# Patient Record
Sex: Female | Born: 1935 | Race: White | Hispanic: No | Marital: Married | Smoking: Never smoker
Health system: Southern US, Community
[De-identification: ages and names within clinical notes are randomized; demographics above are authoritative.]

## PROBLEM LIST (undated history)

## (undated) DIAGNOSIS — G4700 Insomnia, unspecified: Secondary | ICD-10-CM

## (undated) DIAGNOSIS — Z923 Personal history of irradiation: Secondary | ICD-10-CM

## (undated) DIAGNOSIS — Z9889 Other specified postprocedural states: Secondary | ICD-10-CM

## (undated) DIAGNOSIS — Z8489 Family history of other specified conditions: Secondary | ICD-10-CM

## (undated) DIAGNOSIS — R112 Nausea with vomiting, unspecified: Secondary | ICD-10-CM

## (undated) DIAGNOSIS — G43909 Migraine, unspecified, not intractable, without status migrainosus: Secondary | ICD-10-CM

## (undated) DIAGNOSIS — M5020 Other cervical disc displacement, unspecified cervical region: Secondary | ICD-10-CM

## (undated) DIAGNOSIS — C50919 Malignant neoplasm of unspecified site of unspecified female breast: Secondary | ICD-10-CM

## (undated) DIAGNOSIS — I10 Essential (primary) hypertension: Secondary | ICD-10-CM

## (undated) DIAGNOSIS — Z800 Family history of malignant neoplasm of digestive organs: Secondary | ICD-10-CM

## (undated) DIAGNOSIS — H409 Unspecified glaucoma: Secondary | ICD-10-CM

## (undated) DIAGNOSIS — K219 Gastro-esophageal reflux disease without esophagitis: Secondary | ICD-10-CM

## (undated) DIAGNOSIS — C50911 Malignant neoplasm of unspecified site of right female breast: Secondary | ICD-10-CM

## (undated) DIAGNOSIS — Z87442 Personal history of urinary calculi: Secondary | ICD-10-CM

## (undated) DIAGNOSIS — E785 Hyperlipidemia, unspecified: Secondary | ICD-10-CM

## (undated) DIAGNOSIS — M75101 Unspecified rotator cuff tear or rupture of right shoulder, not specified as traumatic: Secondary | ICD-10-CM

## (undated) DIAGNOSIS — M1990 Unspecified osteoarthritis, unspecified site: Secondary | ICD-10-CM

## (undated) DIAGNOSIS — C4490 Unspecified malignant neoplasm of skin, unspecified: Secondary | ICD-10-CM

## (undated) DIAGNOSIS — Z9221 Personal history of antineoplastic chemotherapy: Secondary | ICD-10-CM

## (undated) DIAGNOSIS — M8580 Other specified disorders of bone density and structure, unspecified site: Secondary | ICD-10-CM

## (undated) DIAGNOSIS — C50912 Malignant neoplasm of unspecified site of left female breast: Secondary | ICD-10-CM

## (undated) DIAGNOSIS — Z803 Family history of malignant neoplasm of breast: Secondary | ICD-10-CM

## (undated) HISTORY — PX: REPLACEMENT TOTAL KNEE BILATERAL: SUR1225

## (undated) HISTORY — PX: CHOLECYSTECTOMY OPEN: SUR202

## (undated) HISTORY — DX: Family history of malignant neoplasm of digestive organs: Z80.0

## (undated) HISTORY — DX: Other specified disorders of bone density and structure, unspecified site: M85.80

## (undated) HISTORY — DX: Insomnia, unspecified: G47.00

## (undated) HISTORY — DX: Gastro-esophageal reflux disease without esophagitis: K21.9

## (undated) HISTORY — PX: COLONOSCOPY W/ BIOPSIES AND POLYPECTOMY: SHX1376

## (undated) HISTORY — PX: PORTACATH PLACEMENT: SHX2246

## (undated) HISTORY — DX: Other cervical disc displacement, unspecified cervical region: M50.20

## (undated) HISTORY — DX: Family history of malignant neoplasm of breast: Z80.3

## (undated) HISTORY — DX: Malignant neoplasm of unspecified site of unspecified female breast: C50.919

## (undated) HISTORY — PX: TONSILLECTOMY: SUR1361

## (undated) HISTORY — PX: SKIN CANCER EXCISION: SHX779

## (undated) HISTORY — DX: Essential (primary) hypertension: I10

## (undated) HISTORY — PX: DILATION AND CURETTAGE OF UTERUS: SHX78

## (undated) HISTORY — PX: JOINT REPLACEMENT: SHX530

## (undated) HISTORY — PX: MASTECTOMY PARTIAL / LUMPECTOMY: SUR851

## (undated) HISTORY — PX: BREAST BIOPSY: SHX20

## (undated) HISTORY — DX: Hyperlipidemia, unspecified: E78.5

## (undated) HISTORY — PX: GLAUCOMA SURGERY: SHX656

## (undated) HISTORY — PX: WISDOM TOOTH EXTRACTION: SHX21

## (undated) HISTORY — PX: CATARACT EXTRACTION, BILATERAL: SHX1313

---

## 1980-07-01 HISTORY — PX: KIDNEY STONE SURGERY: SHX686

## 1997-07-01 LAB — HM SIGMOIDOSCOPY

## 1998-06-02 ENCOUNTER — Ambulatory Visit (HOSPITAL_COMMUNITY): Admission: RE | Admit: 1998-06-02 | Discharge: 1998-06-02 | Payer: Self-pay | Admitting: Family Medicine

## 1998-06-02 ENCOUNTER — Encounter: Payer: Self-pay | Admitting: Family Medicine

## 1998-11-03 ENCOUNTER — Encounter: Payer: Self-pay | Admitting: Internal Medicine

## 1998-11-03 ENCOUNTER — Ambulatory Visit (HOSPITAL_COMMUNITY): Admission: RE | Admit: 1998-11-03 | Discharge: 1998-11-03 | Payer: Self-pay | Admitting: Internal Medicine

## 1999-02-25 ENCOUNTER — Emergency Department (HOSPITAL_COMMUNITY): Admission: EM | Admit: 1999-02-25 | Discharge: 1999-02-25 | Payer: Self-pay

## 1999-03-14 ENCOUNTER — Encounter (INDEPENDENT_AMBULATORY_CARE_PROVIDER_SITE_OTHER): Payer: Self-pay | Admitting: Specialist

## 1999-03-14 ENCOUNTER — Encounter: Payer: Self-pay | Admitting: Surgery

## 1999-03-15 ENCOUNTER — Inpatient Hospital Stay (HOSPITAL_COMMUNITY): Admission: EM | Admit: 1999-03-15 | Discharge: 1999-03-17 | Payer: Self-pay | Admitting: Surgery

## 1999-04-24 ENCOUNTER — Encounter: Payer: Self-pay | Admitting: Family Medicine

## 1999-04-24 ENCOUNTER — Ambulatory Visit (HOSPITAL_COMMUNITY): Admission: RE | Admit: 1999-04-24 | Discharge: 1999-04-24 | Payer: Self-pay | Admitting: Family Medicine

## 2000-08-07 ENCOUNTER — Other Ambulatory Visit: Admission: RE | Admit: 2000-08-07 | Discharge: 2000-08-07 | Payer: Self-pay | Admitting: Family Medicine

## 2000-08-15 ENCOUNTER — Ambulatory Visit (HOSPITAL_COMMUNITY): Admission: RE | Admit: 2000-08-15 | Discharge: 2000-08-15 | Payer: Self-pay | Admitting: Family Medicine

## 2000-08-15 ENCOUNTER — Encounter: Payer: Self-pay | Admitting: Family Medicine

## 2000-10-21 ENCOUNTER — Encounter (INDEPENDENT_AMBULATORY_CARE_PROVIDER_SITE_OTHER): Payer: Self-pay | Admitting: *Deleted

## 2000-10-21 ENCOUNTER — Ambulatory Visit (HOSPITAL_BASED_OUTPATIENT_CLINIC_OR_DEPARTMENT_OTHER): Admission: RE | Admit: 2000-10-21 | Discharge: 2000-10-21 | Payer: Self-pay | Admitting: Plastic Surgery

## 2001-03-12 ENCOUNTER — Encounter: Admission: RE | Admit: 2001-03-12 | Discharge: 2001-03-12 | Payer: Self-pay | Admitting: Family Medicine

## 2001-03-12 ENCOUNTER — Encounter: Payer: Self-pay | Admitting: Family Medicine

## 2003-02-10 ENCOUNTER — Other Ambulatory Visit: Admission: RE | Admit: 2003-02-10 | Discharge: 2003-02-10 | Payer: Self-pay | Admitting: Family Medicine

## 2003-08-12 ENCOUNTER — Encounter: Admission: RE | Admit: 2003-08-12 | Discharge: 2003-08-12 | Payer: Self-pay | Admitting: Family Medicine

## 2004-02-18 ENCOUNTER — Ambulatory Visit (HOSPITAL_COMMUNITY): Admission: RE | Admit: 2004-02-18 | Discharge: 2004-02-18 | Payer: Self-pay | Admitting: Orthopedic Surgery

## 2004-03-30 ENCOUNTER — Ambulatory Visit (HOSPITAL_COMMUNITY): Admission: RE | Admit: 2004-03-30 | Discharge: 2004-03-30 | Payer: Self-pay | Admitting: Family Medicine

## 2004-04-25 ENCOUNTER — Ambulatory Visit: Payer: Self-pay | Admitting: Family Medicine

## 2004-04-25 ENCOUNTER — Other Ambulatory Visit: Admission: RE | Admit: 2004-04-25 | Discharge: 2004-04-25 | Payer: Self-pay | Admitting: Family Medicine

## 2004-05-10 ENCOUNTER — Ambulatory Visit: Payer: Self-pay | Admitting: Family Medicine

## 2004-07-01 LAB — HM MAMMOGRAPHY

## 2004-08-22 ENCOUNTER — Ambulatory Visit: Payer: Self-pay | Admitting: Family Medicine

## 2004-08-22 ENCOUNTER — Encounter: Admission: RE | Admit: 2004-08-22 | Discharge: 2004-08-22 | Payer: Self-pay | Admitting: Family Medicine

## 2004-08-28 ENCOUNTER — Ambulatory Visit: Payer: Self-pay | Admitting: Family Medicine

## 2004-08-30 ENCOUNTER — Encounter: Admission: RE | Admit: 2004-08-30 | Discharge: 2004-08-30 | Payer: Self-pay | Admitting: Family Medicine

## 2004-09-17 ENCOUNTER — Inpatient Hospital Stay (HOSPITAL_COMMUNITY): Admission: RE | Admit: 2004-09-17 | Discharge: 2004-09-20 | Payer: Self-pay | Admitting: Orthopedic Surgery

## 2004-10-26 ENCOUNTER — Ambulatory Visit: Payer: Self-pay | Admitting: Family Medicine

## 2005-03-27 ENCOUNTER — Ambulatory Visit (HOSPITAL_COMMUNITY): Admission: RE | Admit: 2005-03-27 | Discharge: 2005-03-27 | Payer: Self-pay | Admitting: Family Medicine

## 2005-04-23 ENCOUNTER — Ambulatory Visit: Payer: Self-pay | Admitting: Family Medicine

## 2005-04-26 ENCOUNTER — Ambulatory Visit: Payer: Self-pay | Admitting: Gastroenterology

## 2005-05-14 ENCOUNTER — Ambulatory Visit: Payer: Self-pay | Admitting: Gastroenterology

## 2005-05-14 ENCOUNTER — Encounter (INDEPENDENT_AMBULATORY_CARE_PROVIDER_SITE_OTHER): Payer: Self-pay | Admitting: *Deleted

## 2005-07-01 HISTORY — PX: MASTECTOMY PARTIAL / LUMPECTOMY: SUR851

## 2005-08-07 ENCOUNTER — Ambulatory Visit: Payer: Self-pay | Admitting: Family Medicine

## 2005-11-06 ENCOUNTER — Ambulatory Visit: Payer: Self-pay | Admitting: Family Medicine

## 2006-03-31 ENCOUNTER — Ambulatory Visit (HOSPITAL_COMMUNITY): Admission: RE | Admit: 2006-03-31 | Discharge: 2006-03-31 | Payer: Self-pay | Admitting: Family Medicine

## 2006-04-03 ENCOUNTER — Encounter: Admission: RE | Admit: 2006-04-03 | Discharge: 2006-04-03 | Payer: Self-pay | Admitting: Family Medicine

## 2006-04-07 ENCOUNTER — Encounter (INDEPENDENT_AMBULATORY_CARE_PROVIDER_SITE_OTHER): Payer: Self-pay | Admitting: *Deleted

## 2006-04-07 ENCOUNTER — Encounter: Admission: RE | Admit: 2006-04-07 | Discharge: 2006-04-07 | Payer: Self-pay | Admitting: Family Medicine

## 2006-04-07 ENCOUNTER — Encounter (INDEPENDENT_AMBULATORY_CARE_PROVIDER_SITE_OTHER): Payer: Self-pay | Admitting: Diagnostic Radiology

## 2006-04-07 DIAGNOSIS — Z853 Personal history of malignant neoplasm of breast: Secondary | ICD-10-CM | POA: Insufficient documentation

## 2006-04-10 ENCOUNTER — Ambulatory Visit: Payer: Self-pay | Admitting: Family Medicine

## 2006-04-10 ENCOUNTER — Encounter: Payer: Self-pay | Admitting: Family Medicine

## 2006-04-10 ENCOUNTER — Other Ambulatory Visit: Admission: RE | Admit: 2006-04-10 | Discharge: 2006-04-10 | Payer: Self-pay | Admitting: Family Medicine

## 2006-04-10 LAB — CONVERTED CEMR LAB
ALT: 21 units/L (ref 0–40)
AST: 18 units/L (ref 0–37)
Alkaline Phosphatase: 77 units/L (ref 39–117)
BUN: 14 mg/dL (ref 6–23)
Basophils Absolute: 0 10*3/uL (ref 0.0–0.1)
Basophils Relative: 0.3 % (ref 0.0–1.0)
Calcium: 9.2 mg/dL (ref 8.4–10.5)
Chol/HDL Ratio, serum: 6.9
Cholesterol: 264 mg/dL (ref 0–200)
Creatinine, Ser: 1.1 mg/dL (ref 0.4–1.2)
Eosinophil percent: 1.5 % (ref 0.0–5.0)
Glucose, Bld: 92 mg/dL (ref 70–99)
HCT: 39 % (ref 36.0–46.0)
HDL: 38.2 mg/dL — ABNORMAL LOW (ref >39.0)
Hemoglobin: 13.3 g/dL (ref 12.0–15.0)
LDL DIRECT: 180.7 mg/dL
Lymphocytes Relative: 28 % (ref 12.0–46.0)
MCHC: 34.2 g/dL (ref 30.0–36.0)
MCV: 91.6 fL (ref 78.0–100.0)
Monocytes Absolute: 0.6 10*3/uL (ref 0.2–0.7)
Monocytes Relative: 8 % (ref 3.0–11.0)
Neutro Abs: 5.1 10*3/uL (ref 1.4–7.7)
Neutrophils Relative %: 62.2 % (ref 43.0–77.0)
Platelets: 253 10*3/uL (ref 150–400)
Potassium: 3.4 meq/L — ABNORMAL LOW (ref 3.5–5.1)
RBC: 4.26 M/uL (ref 3.87–5.11)
RDW: 12.5 % (ref 11.5–14.6)
TSH: 1.91 microintl units/mL (ref 0.35–5.50)
Triglyceride fasting, serum: 193 mg/dL — ABNORMAL HIGH (ref 0–149)
VLDL: 39 mg/dL (ref 0–40)
WBC: 8.1 10*3/uL (ref 4.5–10.5)

## 2006-04-10 LAB — FECAL OCCULT BLOOD, GUAIAC: Fecal Occult Blood: NEGATIVE

## 2006-04-14 ENCOUNTER — Encounter: Admission: RE | Admit: 2006-04-14 | Discharge: 2006-04-14 | Payer: Self-pay | Admitting: Family Medicine

## 2006-04-15 ENCOUNTER — Ambulatory Visit: Payer: Self-pay | Admitting: Family Medicine

## 2006-04-21 ENCOUNTER — Encounter: Admission: RE | Admit: 2006-04-21 | Discharge: 2006-04-21 | Payer: Self-pay | Admitting: Family Medicine

## 2006-05-01 HISTORY — PX: INCISION AND DRAINAGE BREAST ABSCESS: SUR672

## 2006-05-02 ENCOUNTER — Encounter: Admission: RE | Admit: 2006-05-02 | Discharge: 2006-05-02 | Payer: Self-pay | Admitting: Surgery

## 2006-05-02 ENCOUNTER — Encounter (INDEPENDENT_AMBULATORY_CARE_PROVIDER_SITE_OTHER): Payer: Self-pay | Admitting: *Deleted

## 2006-05-02 ENCOUNTER — Ambulatory Visit (HOSPITAL_BASED_OUTPATIENT_CLINIC_OR_DEPARTMENT_OTHER): Admission: RE | Admit: 2006-05-02 | Discharge: 2006-05-02 | Payer: Self-pay | Admitting: Surgery

## 2006-05-14 ENCOUNTER — Ambulatory Visit: Payer: Self-pay | Admitting: Oncology

## 2006-05-15 ENCOUNTER — Ambulatory Visit: Admission: RE | Admit: 2006-05-15 | Discharge: 2006-06-29 | Payer: Self-pay | Admitting: Radiation Oncology

## 2006-05-15 ENCOUNTER — Ambulatory Visit: Payer: Self-pay | Admitting: Family Medicine

## 2006-05-15 LAB — CONVERTED CEMR LAB
Chol/HDL Ratio, serum: 5.5
Cholesterol: 216 mg/dL (ref 0–200)
HDL: 39.1 mg/dL (ref >39.0)
LDL DIRECT: 142.4 mg/dL
Triglyceride fasting, serum: 171 mg/dL — ABNORMAL HIGH (ref 0–149)
VLDL: 34 mg/dL (ref 0–40)

## 2006-05-28 LAB — COMPREHENSIVE METABOLIC PANEL
ALT: 22 U/L (ref 0–35)
AST: 19 U/L (ref 0–37)
Albumin: 3.8 g/dL (ref 3.5–5.2)
Alkaline Phosphatase: 79 U/L (ref 39–117)
BUN: 17 mg/dL (ref 6–23)
CO2: 26 mEq/L (ref 19–32)
Calcium: 9.4 mg/dL (ref 8.4–10.5)
Chloride: 107 mEq/L (ref 96–112)
Creatinine, Ser: 1.16 mg/dL (ref 0.40–1.20)
Glucose, Bld: 123 mg/dL — ABNORMAL HIGH (ref 70–99)
Potassium: 4.1 mEq/L (ref 3.5–5.3)
Sodium: 142 mEq/L (ref 135–145)
Total Bilirubin: 0.3 mg/dL (ref 0.3–1.2)
Total Protein: 6.7 g/dL (ref 6.0–8.3)

## 2006-05-28 LAB — CBC WITH DIFFERENTIAL/PLATELET
BASO%: 0.4 % (ref 0.0–2.0)
Basophils Absolute: 0 10*3/uL (ref 0.0–0.1)
EOS%: 3.1 % (ref 0.0–7.0)
Eosinophils Absolute: 0.2 10*3/uL (ref 0.0–0.5)
HCT: 37.6 % (ref 34.8–46.6)
HGB: 12.8 g/dL (ref 11.6–15.9)
LYMPH%: 29 % (ref 14.0–48.0)
MCH: 30.7 pg (ref 26.0–34.0)
MCHC: 33.9 g/dL (ref 32.0–36.0)
MCV: 90.6 fL (ref 81.0–101.0)
MONO#: 0.4 10*3/uL (ref 0.1–0.9)
MONO%: 6.2 % (ref 0.0–13.0)
NEUT#: 4.3 10*3/uL (ref 1.5–6.5)
NEUT%: 61.3 % (ref 39.6–76.8)
Platelets: 273 10*3/uL (ref 145–400)
RBC: 4.15 10*6/uL (ref 3.70–5.32)
RDW: 12.6 % (ref 11.3–14.5)
WBC: 7 10*3/uL (ref 3.9–10.0)
lymph#: 2 10*3/uL (ref 0.9–3.3)

## 2006-05-28 LAB — CANCER ANTIGEN 27.29: CA 27.29: 10 U/mL (ref 0–39)

## 2006-05-28 LAB — LACTATE DEHYDROGENASE: LDH: 168 U/L (ref 94–250)

## 2006-05-29 ENCOUNTER — Encounter (INDEPENDENT_AMBULATORY_CARE_PROVIDER_SITE_OTHER): Payer: Self-pay | Admitting: Specialist

## 2006-05-29 ENCOUNTER — Ambulatory Visit (HOSPITAL_COMMUNITY): Admission: RE | Admit: 2006-05-29 | Discharge: 2006-05-29 | Payer: Self-pay | Admitting: Surgery

## 2006-06-13 ENCOUNTER — Ambulatory Visit (HOSPITAL_COMMUNITY): Admission: RE | Admit: 2006-06-13 | Discharge: 2006-06-13 | Payer: Self-pay | Admitting: Oncology

## 2006-07-18 ENCOUNTER — Ambulatory Visit: Payer: Self-pay | Admitting: Oncology

## 2006-07-23 ENCOUNTER — Ambulatory Visit (HOSPITAL_COMMUNITY): Admission: RE | Admit: 2006-07-23 | Discharge: 2006-07-23 | Payer: Self-pay | Admitting: Oncology

## 2006-07-23 LAB — CBC WITH DIFFERENTIAL/PLATELET
BASO%: 0.3 % (ref 0.0–2.0)
Basophils Absolute: 0 10*3/uL (ref 0.0–0.1)
EOS%: 1.2 % (ref 0.0–7.0)
Eosinophils Absolute: 0.1 10*3/uL (ref 0.0–0.5)
HCT: 38.5 % (ref 34.8–46.6)
HGB: 13.2 g/dL (ref 11.6–15.9)
LYMPH%: 26.3 % (ref 14.0–48.0)
MCH: 30.9 pg (ref 26.0–34.0)
MCHC: 34.3 g/dL (ref 32.0–36.0)
MCV: 89.9 fL (ref 81.0–101.0)
MONO#: 0.5 10*3/uL (ref 0.1–0.9)
MONO%: 6.5 % (ref 0.0–13.0)
NEUT#: 4.7 10*3/uL (ref 1.5–6.5)
NEUT%: 65.7 % (ref 39.6–76.8)
Platelets: 242 10*3/uL (ref 145–400)
RBC: 4.29 10*6/uL (ref 3.70–5.32)
RDW: 13 % (ref 11.3–14.5)
WBC: 7.2 10*3/uL (ref 3.9–10.0)
lymph#: 1.9 10*3/uL (ref 0.9–3.3)

## 2006-07-23 LAB — COMPREHENSIVE METABOLIC PANEL
ALT: 14 U/L (ref 0–35)
AST: 14 U/L (ref 0–37)
Albumin: 3.9 g/dL (ref 3.5–5.2)
Alkaline Phosphatase: 77 U/L (ref 39–117)
BUN: 23 mg/dL (ref 6–23)
CO2: 25 mEq/L (ref 19–32)
Calcium: 9 mg/dL (ref 8.4–10.5)
Chloride: 108 mEq/L (ref 96–112)
Creatinine, Ser: 1.17 mg/dL (ref 0.40–1.20)
Glucose, Bld: 95 mg/dL (ref 70–99)
Potassium: 4.2 mEq/L (ref 3.5–5.3)
Sodium: 142 mEq/L (ref 135–145)
Total Bilirubin: 0.4 mg/dL (ref 0.3–1.2)
Total Protein: 7 g/dL (ref 6.0–8.3)

## 2006-07-23 LAB — LACTATE DEHYDROGENASE: LDH: 179 U/L (ref 94–250)

## 2006-11-10 ENCOUNTER — Ambulatory Visit: Payer: Self-pay | Admitting: Oncology

## 2006-11-12 LAB — COMPREHENSIVE METABOLIC PANEL
ALT: 14 U/L (ref 0–35)
AST: 14 U/L (ref 0–37)
Albumin: 3.7 g/dL (ref 3.5–5.2)
Alkaline Phosphatase: 63 U/L (ref 39–117)
BUN: 20 mg/dL (ref 6–23)
CO2: 28 mEq/L (ref 19–32)
Calcium: 8.5 mg/dL (ref 8.4–10.5)
Chloride: 109 mEq/L (ref 96–112)
Creatinine, Ser: 1.11 mg/dL (ref 0.40–1.20)
Glucose, Bld: 114 mg/dL — ABNORMAL HIGH (ref 70–99)
Potassium: 4 mEq/L (ref 3.5–5.3)
Sodium: 141 mEq/L (ref 135–145)
Total Bilirubin: 0.3 mg/dL (ref 0.3–1.2)
Total Protein: 6.4 g/dL (ref 6.0–8.3)

## 2006-11-12 LAB — CBC WITH DIFFERENTIAL/PLATELET
BASO%: 0.2 % (ref 0.0–2.0)
Basophils Absolute: 0 10*3/uL (ref 0.0–0.1)
EOS%: 1.9 % (ref 0.0–7.0)
Eosinophils Absolute: 0.1 10*3/uL (ref 0.0–0.5)
HCT: 33.7 % — ABNORMAL LOW (ref 34.8–46.6)
HGB: 11.9 g/dL (ref 11.6–15.9)
LYMPH%: 32 % (ref 14.0–48.0)
MCH: 31.8 pg (ref 26.0–34.0)
MCHC: 35.2 g/dL (ref 32.0–36.0)
MCV: 90.3 fL (ref 81.0–101.0)
MONO#: 0.5 10*3/uL (ref 0.1–0.9)
MONO%: 7.9 % (ref 0.0–13.0)
NEUT#: 3.5 10*3/uL (ref 1.5–6.5)
NEUT%: 58 % (ref 39.6–76.8)
Platelets: 199 10*3/uL (ref 145–400)
RBC: 3.73 10*6/uL (ref 3.70–5.32)
RDW: 12.8 % (ref 11.3–14.5)
WBC: 6.1 10*3/uL (ref 3.9–10.0)
lymph#: 2 10*3/uL (ref 0.9–3.3)

## 2006-11-12 LAB — CANCER ANTIGEN 27.29: CA 27.29: 9 U/mL (ref 0–39)

## 2006-11-12 LAB — LACTATE DEHYDROGENASE: LDH: 164 U/L (ref 94–250)

## 2007-01-05 ENCOUNTER — Ambulatory Visit: Payer: Self-pay | Admitting: Family Medicine

## 2007-01-13 ENCOUNTER — Encounter: Admission: RE | Admit: 2007-01-13 | Discharge: 2007-01-13 | Payer: Self-pay | Admitting: Radiation Oncology

## 2007-03-04 DIAGNOSIS — E785 Hyperlipidemia, unspecified: Secondary | ICD-10-CM | POA: Insufficient documentation

## 2007-03-04 DIAGNOSIS — IMO0002 Reserved for concepts with insufficient information to code with codable children: Secondary | ICD-10-CM | POA: Insufficient documentation

## 2007-03-04 DIAGNOSIS — M810 Age-related osteoporosis without current pathological fracture: Secondary | ICD-10-CM | POA: Insufficient documentation

## 2007-03-04 DIAGNOSIS — M8580 Other specified disorders of bone density and structure, unspecified site: Secondary | ICD-10-CM | POA: Insufficient documentation

## 2007-03-04 DIAGNOSIS — I10 Essential (primary) hypertension: Secondary | ICD-10-CM | POA: Insufficient documentation

## 2007-03-17 ENCOUNTER — Ambulatory Visit: Payer: Self-pay | Admitting: Oncology

## 2007-03-24 LAB — CBC WITH DIFFERENTIAL/PLATELET
BASO%: 0.2 % (ref 0.0–2.0)
Basophils Absolute: 0 10*3/uL (ref 0.0–0.1)
EOS%: 2.9 % (ref 0.0–7.0)
Eosinophils Absolute: 0.2 10*3/uL (ref 0.0–0.5)
HCT: 34.9 % (ref 34.8–46.6)
HGB: 12.3 g/dL (ref 11.6–15.9)
LYMPH%: 31.4 % (ref 14.0–48.0)
MCH: 31.8 pg (ref 26.0–34.0)
MCHC: 35.2 g/dL (ref 32.0–36.0)
MCV: 90.4 fL (ref 81.0–101.0)
MONO#: 0.6 10*3/uL (ref 0.1–0.9)
MONO%: 8.2 % (ref 0.0–13.0)
NEUT#: 3.9 10*3/uL (ref 1.5–6.5)
NEUT%: 57.3 % (ref 39.6–76.8)
Platelets: 200 10*3/uL (ref 145–400)
RBC: 3.86 10*6/uL (ref 3.70–5.32)
RDW: 12.5 % (ref 11.3–14.5)
WBC: 6.8 10*3/uL (ref 3.9–10.0)
lymph#: 2.1 10*3/uL (ref 0.9–3.3)

## 2007-03-24 LAB — COMPREHENSIVE METABOLIC PANEL
ALT: 14 U/L (ref 0–35)
AST: 15 U/L (ref 0–37)
Albumin: 3.8 g/dL (ref 3.5–5.2)
Alkaline Phosphatase: 63 U/L (ref 39–117)
BUN: 19 mg/dL (ref 6–23)
CO2: 27 mEq/L (ref 19–32)
Calcium: 9.1 mg/dL (ref 8.4–10.5)
Chloride: 106 mEq/L (ref 96–112)
Creatinine, Ser: 1.1 mg/dL (ref 0.40–1.20)
Glucose, Bld: 103 mg/dL — ABNORMAL HIGH (ref 70–99)
Potassium: 4.2 mEq/L (ref 3.5–5.3)
Sodium: 141 mEq/L (ref 135–145)
Total Bilirubin: 0.3 mg/dL (ref 0.3–1.2)
Total Protein: 6.7 g/dL (ref 6.0–8.3)

## 2007-03-24 LAB — CANCER ANTIGEN 27.29: CA 27.29: 8 U/mL (ref 0–39)

## 2007-03-24 LAB — LACTATE DEHYDROGENASE: LDH: 163 U/L (ref 94–250)

## 2007-04-08 ENCOUNTER — Encounter: Payer: Self-pay | Admitting: Family Medicine

## 2007-04-08 ENCOUNTER — Other Ambulatory Visit: Admission: RE | Admit: 2007-04-08 | Discharge: 2007-04-08 | Payer: Self-pay | Admitting: Family Medicine

## 2007-04-08 ENCOUNTER — Ambulatory Visit: Payer: Self-pay | Admitting: Family Medicine

## 2007-05-18 ENCOUNTER — Encounter: Admission: RE | Admit: 2007-05-18 | Discharge: 2007-05-18 | Payer: Self-pay | Admitting: Surgery

## 2007-07-23 ENCOUNTER — Ambulatory Visit: Payer: Self-pay | Admitting: Oncology

## 2007-07-27 LAB — COMPREHENSIVE METABOLIC PANEL
ALT: 18 U/L (ref 0–35)
AST: 17 U/L (ref 0–37)
Albumin: 3.9 g/dL (ref 3.5–5.2)
Alkaline Phosphatase: 65 U/L (ref 39–117)
BUN: 20 mg/dL (ref 6–23)
CO2: 26 mEq/L (ref 19–32)
Calcium: 9.2 mg/dL (ref 8.4–10.5)
Chloride: 107 mEq/L (ref 96–112)
Creatinine, Ser: 1.24 mg/dL — ABNORMAL HIGH (ref 0.40–1.20)
Glucose, Bld: 113 mg/dL — ABNORMAL HIGH (ref 70–99)
Potassium: 4.2 mEq/L (ref 3.5–5.3)
Sodium: 143 mEq/L (ref 135–145)
Total Bilirubin: 0.3 mg/dL (ref 0.3–1.2)
Total Protein: 6.9 g/dL (ref 6.0–8.3)

## 2007-07-27 LAB — CBC WITH DIFFERENTIAL/PLATELET
BASO%: 0.8 % (ref 0.0–2.0)
Basophils Absolute: 0.1 10*3/uL (ref 0.0–0.1)
EOS%: 2.3 % (ref 0.0–7.0)
Eosinophils Absolute: 0.2 10*3/uL (ref 0.0–0.5)
HCT: 35.7 % (ref 34.8–46.6)
HGB: 12.7 g/dL (ref 11.6–15.9)
LYMPH%: 35.4 % (ref 14.0–48.0)
MCH: 32.3 pg (ref 26.0–34.0)
MCHC: 35.5 g/dL (ref 32.0–36.0)
MCV: 91.1 fL (ref 81.0–101.0)
MONO#: 0.5 10*3/uL (ref 0.1–0.9)
MONO%: 6.5 % (ref 0.0–13.0)
NEUT#: 4.1 10*3/uL (ref 1.5–6.5)
NEUT%: 55 % (ref 39.6–76.8)
Platelets: 206 10*3/uL (ref 145–400)
RBC: 3.92 10*6/uL (ref 3.70–5.32)
RDW: 12.3 % (ref 11.3–14.5)
WBC: 7.5 10*3/uL (ref 3.9–10.0)
lymph#: 2.7 10*3/uL (ref 0.9–3.3)

## 2007-07-27 LAB — CANCER ANTIGEN 27.29: CA 27.29: 15 U/mL (ref 0–39)

## 2007-07-27 LAB — LACTATE DEHYDROGENASE: LDH: 160 U/L (ref 94–250)

## 2007-11-17 ENCOUNTER — Encounter: Admission: RE | Admit: 2007-11-17 | Discharge: 2007-11-17 | Payer: Self-pay | Admitting: Oncology

## 2007-11-19 ENCOUNTER — Ambulatory Visit: Payer: Self-pay | Admitting: Oncology

## 2008-03-21 ENCOUNTER — Ambulatory Visit: Payer: Self-pay | Admitting: Oncology

## 2008-04-02 ENCOUNTER — Ambulatory Visit: Payer: Self-pay | Admitting: Family Medicine

## 2008-04-04 LAB — CBC WITH DIFFERENTIAL/PLATELET
BASO%: 0.3 % (ref 0.0–2.0)
Basophils Absolute: 0 10*3/uL (ref 0.0–0.1)
EOS%: 1.4 % (ref 0.0–7.0)
Eosinophils Absolute: 0.1 10*3/uL (ref 0.0–0.5)
HCT: 34.7 % — ABNORMAL LOW (ref 34.8–46.6)
HGB: 12.1 g/dL (ref 11.6–15.9)
LYMPH%: 31.5 % (ref 14.0–48.0)
MCH: 32.1 pg (ref 26.0–34.0)
MCHC: 34.7 g/dL (ref 32.0–36.0)
MCV: 92.4 fL (ref 81.0–101.0)
MONO#: 0.4 10*3/uL (ref 0.1–0.9)
MONO%: 6.1 % (ref 0.0–13.0)
NEUT#: 3.9 10*3/uL (ref 1.5–6.5)
NEUT%: 60.7 % (ref 39.6–76.8)
Platelets: 184 10*3/uL (ref 145–400)
RBC: 3.76 10*6/uL (ref 3.70–5.32)
RDW: 12.4 % (ref 11.3–14.5)
WBC: 6.4 10*3/uL (ref 3.9–10.0)
lymph#: 2 10*3/uL (ref 0.9–3.3)

## 2008-04-04 LAB — COMPREHENSIVE METABOLIC PANEL
ALT: 19 U/L (ref 0–35)
AST: 19 U/L (ref 0–37)
Albumin: 3.8 g/dL (ref 3.5–5.2)
Alkaline Phosphatase: 61 U/L (ref 39–117)
BUN: 23 mg/dL (ref 6–23)
CO2: 23 mEq/L (ref 19–32)
Calcium: 9 mg/dL (ref 8.4–10.5)
Chloride: 109 mEq/L (ref 96–112)
Creatinine, Ser: 1.11 mg/dL (ref 0.40–1.20)
Glucose, Bld: 114 mg/dL — ABNORMAL HIGH (ref 70–99)
Potassium: 3.9 mEq/L (ref 3.5–5.3)
Sodium: 142 mEq/L (ref 135–145)
Total Bilirubin: 0.2 mg/dL — ABNORMAL LOW (ref 0.3–1.2)
Total Protein: 6.4 g/dL (ref 6.0–8.3)

## 2008-04-04 LAB — CANCER ANTIGEN 27.29: CA 27.29: 12 U/mL (ref 0–39)

## 2008-04-12 ENCOUNTER — Other Ambulatory Visit: Admission: RE | Admit: 2008-04-12 | Discharge: 2008-04-12 | Payer: Self-pay | Admitting: Family Medicine

## 2008-04-12 ENCOUNTER — Ambulatory Visit: Payer: Self-pay | Admitting: Family Medicine

## 2008-04-12 ENCOUNTER — Encounter: Payer: Self-pay | Admitting: Family Medicine

## 2008-04-12 DIAGNOSIS — K219 Gastro-esophageal reflux disease without esophagitis: Secondary | ICD-10-CM | POA: Insufficient documentation

## 2008-04-12 LAB — CONVERTED CEMR LAB
Cholesterol: 162 mg/dL (ref 0–200)
Direct LDL: 56.1 mg/dL
HDL: 30.9 mg/dL — ABNORMAL LOW (ref >39.0)
TSH: 2.09 microintl units/mL (ref 0.35–5.50)
Total CHOL/HDL Ratio: 5.2
Triglycerides: 377 mg/dL (ref 0–149)
VLDL: 75 mg/dL — ABNORMAL HIGH (ref 0–40)

## 2008-04-24 DIAGNOSIS — M26609 Unspecified temporomandibular joint disorder, unspecified side: Secondary | ICD-10-CM | POA: Insufficient documentation

## 2008-04-25 ENCOUNTER — Ambulatory Visit: Payer: Self-pay | Admitting: Family Medicine

## 2008-06-08 ENCOUNTER — Encounter: Admission: RE | Admit: 2008-06-08 | Discharge: 2008-06-08 | Payer: Self-pay | Admitting: Surgery

## 2008-06-13 ENCOUNTER — Inpatient Hospital Stay (HOSPITAL_COMMUNITY): Admission: RE | Admit: 2008-06-13 | Discharge: 2008-06-15 | Payer: Self-pay | Admitting: Orthopedic Surgery

## 2008-07-29 ENCOUNTER — Ambulatory Visit: Payer: Self-pay | Admitting: Oncology

## 2008-08-02 LAB — CBC WITH DIFFERENTIAL/PLATELET
BASO%: 0.4 % (ref 0.0–2.0)
Basophils Absolute: 0 10*3/uL (ref 0.0–0.1)
EOS%: 1.5 % (ref 0.0–7.0)
Eosinophils Absolute: 0.1 10*3/uL (ref 0.0–0.5)
HCT: 34.9 % (ref 34.8–46.6)
HGB: 11.8 g/dL (ref 11.6–15.9)
LYMPH%: 30.9 % (ref 14.0–48.0)
MCH: 31 pg (ref 26.0–34.0)
MCHC: 33.8 g/dL (ref 32.0–36.0)
MCV: 91.4 fL (ref 81.0–101.0)
MONO#: 0.4 10*3/uL (ref 0.1–0.9)
MONO%: 6 % (ref 0.0–13.0)
NEUT#: 4.1 10*3/uL (ref 1.5–6.5)
NEUT%: 61.2 % (ref 39.6–76.8)
Platelets: 198 10*3/uL (ref 145–400)
RBC: 3.82 10*6/uL (ref 3.70–5.32)
RDW: 12.6 % (ref 11.3–14.5)
WBC: 6.7 10*3/uL (ref 3.9–10.0)
lymph#: 2.1 10*3/uL (ref 0.9–3.3)

## 2008-08-03 LAB — COMPREHENSIVE METABOLIC PANEL
ALT: 14 U/L (ref 0–35)
AST: 13 U/L (ref 0–37)
Albumin: 3.8 g/dL (ref 3.5–5.2)
Alkaline Phosphatase: 65 U/L (ref 39–117)
BUN: 15 mg/dL (ref 6–23)
CO2: 25 mEq/L (ref 19–32)
Calcium: 9.1 mg/dL (ref 8.4–10.5)
Chloride: 109 mEq/L (ref 96–112)
Creatinine, Ser: 1.13 mg/dL (ref 0.40–1.20)
Glucose, Bld: 104 mg/dL — ABNORMAL HIGH (ref 70–99)
Potassium: 4.1 mEq/L (ref 3.5–5.3)
Sodium: 144 mEq/L (ref 135–145)
Total Bilirubin: 0.2 mg/dL — ABNORMAL LOW (ref 0.3–1.2)
Total Protein: 6.9 g/dL (ref 6.0–8.3)

## 2008-08-03 LAB — CANCER ANTIGEN 27.29: CA 27.29: 17 U/mL (ref 0–39)

## 2008-08-03 LAB — VITAMIN D 25 HYDROXY (VIT D DEFICIENCY, FRACTURES): Vit D, 25-Hydroxy: 21 ng/mL — ABNORMAL LOW (ref 30–89)

## 2008-09-13 ENCOUNTER — Ambulatory Visit: Payer: Self-pay | Admitting: Oncology

## 2008-09-16 LAB — VITAMIN D 25 HYDROXY (VIT D DEFICIENCY, FRACTURES): Vit D, 25-Hydroxy: 33 ng/mL (ref 30–89)

## 2008-09-20 ENCOUNTER — Telehealth: Payer: Self-pay | Admitting: Family Medicine

## 2009-02-08 ENCOUNTER — Telehealth: Payer: Self-pay | Admitting: Family Medicine

## 2009-04-20 ENCOUNTER — Other Ambulatory Visit: Admission: RE | Admit: 2009-04-20 | Discharge: 2009-04-20 | Payer: Self-pay | Admitting: Family Medicine

## 2009-04-20 ENCOUNTER — Ambulatory Visit: Payer: Self-pay | Admitting: Family Medicine

## 2009-04-20 ENCOUNTER — Encounter: Payer: Self-pay | Admitting: Family Medicine

## 2009-04-21 ENCOUNTER — Telehealth: Payer: Self-pay | Admitting: Family Medicine

## 2009-07-06 ENCOUNTER — Encounter: Admission: RE | Admit: 2009-07-06 | Discharge: 2009-07-06 | Payer: Self-pay | Admitting: Oncology

## 2009-07-27 ENCOUNTER — Telehealth: Payer: Self-pay | Admitting: Family Medicine

## 2009-08-01 ENCOUNTER — Ambulatory Visit (HOSPITAL_BASED_OUTPATIENT_CLINIC_OR_DEPARTMENT_OTHER): Payer: Medicare Other | Admitting: Oncology

## 2009-08-03 LAB — COMPREHENSIVE METABOLIC PANEL
ALT: 18 U/L (ref 0–35)
AST: 17 U/L (ref 0–37)
Albumin: 3.8 g/dL (ref 3.5–5.2)
Alkaline Phosphatase: 69 U/L (ref 39–117)
BUN: 20 mg/dL (ref 6–23)
CO2: 26 mEq/L (ref 19–32)
Calcium: 9 mg/dL (ref 8.4–10.5)
Chloride: 108 mEq/L (ref 96–112)
Creatinine, Ser: 1.24 mg/dL — ABNORMAL HIGH (ref 0.40–1.20)
Glucose, Bld: 88 mg/dL (ref 70–99)
Potassium: 4.2 mEq/L (ref 3.5–5.3)
Sodium: 144 mEq/L (ref 135–145)
Total Bilirubin: 0.3 mg/dL (ref 0.3–1.2)
Total Protein: 7.1 g/dL (ref 6.0–8.3)

## 2009-08-03 LAB — CBC WITH DIFFERENTIAL/PLATELET
BASO%: 0.2 % (ref 0.0–2.0)
Basophils Absolute: 0 10*3/uL (ref 0.0–0.1)
EOS%: 1.6 % (ref 0.0–7.0)
Eosinophils Absolute: 0.1 10*3/uL (ref 0.0–0.5)
HCT: 36.6 % (ref 34.8–46.6)
HGB: 12.5 g/dL (ref 11.6–15.9)
LYMPH%: 25.7 % (ref 14.0–49.7)
MCH: 32 pg (ref 25.1–34.0)
MCHC: 34.3 g/dL (ref 31.5–36.0)
MCV: 93.3 fL (ref 79.5–101.0)
MONO#: 0.6 10*3/uL (ref 0.1–0.9)
MONO%: 6.5 % (ref 0.0–14.0)
NEUT#: 6.1 10*3/uL (ref 1.5–6.5)
NEUT%: 66 % (ref 38.4–76.8)
Platelets: 184 10*3/uL (ref 145–400)
RBC: 3.92 10*6/uL (ref 3.70–5.45)
RDW: 12.6 % (ref 11.2–14.5)
WBC: 9.2 10*3/uL (ref 3.9–10.3)
lymph#: 2.4 10*3/uL (ref 0.9–3.3)

## 2009-08-03 LAB — LACTATE DEHYDROGENASE: LDH: 189 U/L (ref 94–250)

## 2009-08-03 LAB — VITAMIN D 25 HYDROXY (VIT D DEFICIENCY, FRACTURES): Vit D, 25-Hydroxy: 47 ng/mL (ref 30–89)

## 2009-08-03 LAB — CANCER ANTIGEN 27.29: CA 27.29: 9 U/mL (ref 0–39)

## 2009-08-10 ENCOUNTER — Ambulatory Visit: Payer: Self-pay | Admitting: Family Medicine

## 2009-08-10 DIAGNOSIS — N3000 Acute cystitis without hematuria: Secondary | ICD-10-CM | POA: Insufficient documentation

## 2009-08-10 DIAGNOSIS — N390 Urinary tract infection, site not specified: Secondary | ICD-10-CM | POA: Insufficient documentation

## 2009-08-10 LAB — CONVERTED CEMR LAB
Bilirubin Urine: NEGATIVE
Glucose, Urine, Semiquant: NEGATIVE
Ketones, urine, test strip: NEGATIVE
Nitrite: NEGATIVE
Specific Gravity, Urine: 1.015
Urobilinogen, UA: 0.2
pH: 6

## 2009-11-13 ENCOUNTER — Ambulatory Visit: Payer: Self-pay | Admitting: Family Medicine

## 2009-11-13 DIAGNOSIS — M25569 Pain in unspecified knee: Secondary | ICD-10-CM | POA: Insufficient documentation

## 2010-01-04 ENCOUNTER — Ambulatory Visit: Payer: Self-pay | Admitting: Family Medicine

## 2010-01-04 DIAGNOSIS — N950 Postmenopausal bleeding: Secondary | ICD-10-CM | POA: Insufficient documentation

## 2010-01-08 ENCOUNTER — Ambulatory Visit: Payer: Self-pay | Admitting: Family Medicine

## 2010-01-08 DIAGNOSIS — R5383 Other fatigue: Secondary | ICD-10-CM

## 2010-01-08 DIAGNOSIS — R5381 Other malaise: Secondary | ICD-10-CM | POA: Insufficient documentation

## 2010-01-08 LAB — CONVERTED CEMR LAB
Bilirubin Urine: NEGATIVE
Glucose, Urine, Semiquant: NEGATIVE
Ketones, urine, test strip: NEGATIVE
Nitrite: NEGATIVE
Protein, U semiquant: 100
Specific Gravity, Urine: 1.025
Urobilinogen, UA: 0.2
pH: 6

## 2010-01-10 ENCOUNTER — Telehealth: Payer: Self-pay | Admitting: Family Medicine

## 2010-01-10 LAB — CONVERTED CEMR LAB
ALT: 22 units/L (ref 0–35)
AST: 19 units/L (ref 0–37)
Albumin: 3.1 g/dL — ABNORMAL LOW (ref 3.5–5.2)
Alkaline Phosphatase: 66 units/L (ref 39–117)
BUN: 26 mg/dL — ABNORMAL HIGH (ref 6–23)
Basophils Absolute: 0 10*3/uL (ref 0.0–0.1)
Basophils Relative: 0.2 % (ref 0.0–3.0)
Bilirubin, Direct: 0 mg/dL (ref 0.0–0.3)
CO2: 30 meq/L (ref 19–32)
Calcium: 8.8 mg/dL (ref 8.4–10.5)
Chloride: 108 meq/L (ref 96–112)
Creatinine, Ser: 1.1 mg/dL (ref 0.4–1.2)
Eosinophils Absolute: 0.1 10*3/uL (ref 0.0–0.7)
Eosinophils Relative: 0.7 % (ref 0.0–5.0)
Free T4: 0.85 ng/dL (ref 0.60–1.60)
GFR calc non Af Amer: 50.68 mL/min (ref >60)
Glucose, Bld: 61 mg/dL — ABNORMAL LOW (ref 70–99)
HCT: 35.5 % — ABNORMAL LOW (ref 36.0–46.0)
Hemoglobin: 12.3 g/dL (ref 12.0–15.0)
Lymphocytes Relative: 22.9 % (ref 12.0–46.0)
Lymphs Abs: 2.1 10*3/uL (ref 0.7–4.0)
MCHC: 34.8 g/dL (ref 30.0–36.0)
MCV: 94.2 fL (ref 78.0–100.0)
Monocytes Absolute: 0.5 10*3/uL (ref 0.1–1.0)
Monocytes Relative: 5.7 % (ref 3.0–12.0)
Neutro Abs: 6.6 10*3/uL (ref 1.4–7.7)
Neutrophils Relative %: 70.5 % (ref 43.0–77.0)
Platelets: 215 10*3/uL (ref 150.0–400.0)
Potassium: 4.3 meq/L (ref 3.5–5.1)
RBC: 3.77 M/uL — ABNORMAL LOW (ref 3.87–5.11)
RDW: 13.3 % (ref 11.5–14.6)
Sodium: 143 meq/L (ref 135–145)
T3, Free: 2.7 pg/mL (ref 2.3–4.2)
TSH: 1.45 microintl units/mL (ref 0.35–5.50)
Total Bilirubin: 0.3 mg/dL (ref 0.3–1.2)
Total Protein: 5.9 g/dL — ABNORMAL LOW (ref 6.0–8.3)
WBC: 9.4 10*3/uL (ref 4.5–10.5)

## 2010-01-16 ENCOUNTER — Encounter: Admission: RE | Admit: 2010-01-16 | Discharge: 2010-01-16 | Payer: Self-pay | Admitting: Oncology

## 2010-01-23 ENCOUNTER — Ambulatory Visit (HOSPITAL_COMMUNITY): Admission: RE | Admit: 2010-01-23 | Discharge: 2010-01-23 | Payer: Self-pay | Admitting: Obstetrics and Gynecology

## 2010-01-29 ENCOUNTER — Telehealth: Payer: Self-pay | Admitting: Family Medicine

## 2010-04-23 ENCOUNTER — Encounter: Payer: Self-pay | Admitting: Family Medicine

## 2010-04-23 ENCOUNTER — Telehealth: Payer: Self-pay | Admitting: Family Medicine

## 2010-04-23 ENCOUNTER — Ambulatory Visit: Payer: Self-pay | Admitting: Family Medicine

## 2010-04-23 ENCOUNTER — Other Ambulatory Visit: Admission: RE | Admit: 2010-04-23 | Discharge: 2010-04-23 | Payer: Self-pay | Admitting: Family Medicine

## 2010-04-23 LAB — CONVERTED CEMR LAB
Bilirubin Urine: NEGATIVE
Glucose, Urine, Semiquant: NEGATIVE
Ketones, urine, test strip: NEGATIVE
Nitrite: NEGATIVE
Pap Smear: NEGATIVE
Specific Gravity, Urine: 1.02
Urobilinogen, UA: 0.2
pH: 5.5

## 2010-04-23 LAB — HM PAP SMEAR

## 2010-04-24 LAB — CONVERTED CEMR LAB
ALT: 19 units/L (ref 0–35)
AST: 22 units/L (ref 0–37)
Albumin: 3.3 g/dL — ABNORMAL LOW (ref 3.5–5.2)
Alkaline Phosphatase: 68 units/L (ref 39–117)
BUN: 22 mg/dL (ref 6–23)
Basophils Absolute: 0 10*3/uL (ref 0.0–0.1)
Basophils Relative: 0.3 % (ref 0.0–3.0)
Bilirubin, Direct: 0.1 mg/dL (ref 0.0–0.3)
CO2: 27 meq/L (ref 19–32)
Calcium: 9.2 mg/dL (ref 8.4–10.5)
Chloride: 108 meq/L (ref 96–112)
Cholesterol: 165 mg/dL (ref 0–200)
Creatinine, Ser: 1.1 mg/dL (ref 0.4–1.2)
Direct LDL: 92.6 mg/dL
Eosinophils Absolute: 0.1 10*3/uL (ref 0.0–0.7)
Eosinophils Relative: 1.3 % (ref 0.0–5.0)
GFR calc non Af Amer: 53.38 mL/min (ref >60)
Glucose, Bld: 90 mg/dL (ref 70–99)
HCT: 36.5 % (ref 36.0–46.0)
HDL: 35.7 mg/dL — ABNORMAL LOW (ref >39.00)
Hemoglobin: 12.6 g/dL (ref 12.0–15.0)
Lymphocytes Relative: 28.5 % (ref 12.0–46.0)
Lymphs Abs: 2.2 10*3/uL (ref 0.7–4.0)
MCHC: 34.4 g/dL (ref 30.0–36.0)
MCV: 92.9 fL (ref 78.0–100.0)
Monocytes Absolute: 0.6 10*3/uL (ref 0.1–1.0)
Monocytes Relative: 7.4 % (ref 3.0–12.0)
Neutro Abs: 4.9 10*3/uL (ref 1.4–7.7)
Neutrophils Relative %: 62.5 % (ref 43.0–77.0)
Platelets: 174 10*3/uL (ref 150.0–400.0)
Potassium: 3.8 meq/L (ref 3.5–5.1)
RBC: 3.93 M/uL (ref 3.87–5.11)
RDW: 13.4 % (ref 11.5–14.6)
Sodium: 142 meq/L (ref 135–145)
TSH: 1.57 microintl units/mL (ref 0.35–5.50)
Total Bilirubin: 0.4 mg/dL (ref 0.3–1.2)
Total CHOL/HDL Ratio: 5
Total Protein: 6.2 g/dL (ref 6.0–8.3)
Triglycerides: 207 mg/dL — ABNORMAL HIGH (ref 0.0–149.0)
VLDL: 41.4 mg/dL — ABNORMAL HIGH (ref 0.0–40.0)
WBC: 7.8 10*3/uL (ref 4.5–10.5)

## 2010-04-25 ENCOUNTER — Telehealth: Payer: Self-pay | Admitting: *Deleted

## 2010-06-04 ENCOUNTER — Ambulatory Visit: Payer: Self-pay | Admitting: Family Medicine

## 2010-07-09 ENCOUNTER — Encounter
Admission: RE | Admit: 2010-07-09 | Discharge: 2010-07-09 | Payer: Self-pay | Source: Home / Self Care | Attending: Oncology | Admitting: Oncology

## 2010-07-21 ENCOUNTER — Encounter: Payer: Self-pay | Admitting: Family Medicine

## 2010-07-22 ENCOUNTER — Encounter: Payer: Self-pay | Admitting: Oncology

## 2010-07-29 LAB — CONVERTED CEMR LAB
ALT: 17 units/L (ref 0–35)
ALT: 21 units/L (ref 0–35)
AST: 18 units/L (ref 0–37)
AST: 21 units/L (ref 0–37)
Albumin: 3.6 g/dL (ref 3.5–5.2)
Albumin: 3.6 g/dL (ref 3.5–5.2)
Alkaline Phosphatase: 58 units/L (ref 39–117)
Alkaline Phosphatase: 59 units/L (ref 39–117)
BUN: 23 mg/dL (ref 6–23)
BUN: 24 mg/dL — ABNORMAL HIGH (ref 6–23)
Basophils Absolute: 0 10*3/uL (ref 0.0–0.1)
Basophils Absolute: 0.1 10*3/uL (ref 0.0–0.1)
Basophils Relative: 0.5 % (ref 0.0–1.0)
Basophils Relative: 0.7 % (ref 0.0–3.0)
Bilirubin Urine: NEGATIVE
Bilirubin, Direct: 0.1 mg/dL (ref 0.0–0.3)
Bilirubin, Direct: 0.1 mg/dL (ref 0.0–0.3)
Blood in Urine, dipstick: NEGATIVE
CO2: 28 meq/L (ref 19–32)
CO2: 30 meq/L (ref 19–32)
Calcium: 9.1 mg/dL (ref 8.4–10.5)
Calcium: 9.5 mg/dL (ref 8.4–10.5)
Chloride: 108 meq/L (ref 96–112)
Chloride: 112 meq/L (ref 96–112)
Cholesterol: 175 mg/dL (ref 0–200)
Cholesterol: 191 mg/dL (ref 0–200)
Creatinine, Ser: 1.2 mg/dL (ref 0.4–1.2)
Creatinine, Ser: 1.2 mg/dL (ref 0.4–1.2)
Direct LDL: 80.3 mg/dL
Direct LDL: 97.3 mg/dL
Eosinophils Absolute: 0.1 10*3/uL (ref 0.0–0.6)
Eosinophils Absolute: 0.2 10*3/uL (ref 0.0–0.7)
Eosinophils Relative: 2 % (ref 0.0–5.0)
Eosinophils Relative: 2.3 % (ref 0.0–5.0)
GFR calc Af Amer: 57 mL/min
GFR calc non Af Amer: 46.89 mL/min (ref >60)
GFR calc non Af Amer: 47 mL/min
Glucose, Bld: 101 mg/dL — ABNORMAL HIGH (ref 70–99)
Glucose, Bld: 110 mg/dL — ABNORMAL HIGH (ref 70–99)
Glucose, Urine, Semiquant: NEGATIVE
HCT: 35.1 % — ABNORMAL LOW (ref 36.0–46.0)
HCT: 36.8 % (ref 36.0–46.0)
HDL: 36.6 mg/dL — ABNORMAL LOW (ref >39.0)
HDL: 37.7 mg/dL — ABNORMAL LOW (ref >39.00)
Hemoglobin: 12.3 g/dL (ref 12.0–15.0)
Hemoglobin: 12.8 g/dL (ref 12.0–15.0)
Ketones, urine, test strip: NEGATIVE
Lymphocytes Relative: 26 % (ref 12.0–46.0)
Lymphocytes Relative: 31.7 % (ref 12.0–46.0)
Lymphs Abs: 2.3 10*3/uL (ref 0.7–4.0)
MCHC: 34.8 g/dL (ref 30.0–36.0)
MCHC: 35 g/dL (ref 30.0–36.0)
MCV: 91.6 fL (ref 78.0–100.0)
MCV: 94.4 fL (ref 78.0–100.0)
Monocytes Absolute: 0.6 10*3/uL (ref 0.1–1.0)
Monocytes Absolute: 0.6 10*3/uL (ref 0.2–0.7)
Monocytes Relative: 7.5 % (ref 3.0–12.0)
Monocytes Relative: 7.8 % (ref 3.0–11.0)
Neutro Abs: 4.2 10*3/uL (ref 1.4–7.7)
Neutro Abs: 4.6 10*3/uL (ref 1.4–7.7)
Neutrophils Relative %: 57.8 % (ref 43.0–77.0)
Neutrophils Relative %: 63.7 % (ref 43.0–77.0)
Nitrite: NEGATIVE
Platelets: 159 10*3/uL (ref 150.0–400.0)
Platelets: 209 10*3/uL (ref 150–400)
Potassium: 3.9 meq/L (ref 3.5–5.1)
Potassium: 4.6 meq/L (ref 3.5–5.1)
RBC: 3.84 M/uL — ABNORMAL LOW (ref 3.87–5.11)
RBC: 3.9 M/uL (ref 3.87–5.11)
RDW: 11.9 % (ref 11.5–14.6)
RDW: 12 % (ref 11.5–14.6)
Sodium: 142 meq/L (ref 135–145)
Sodium: 146 meq/L — ABNORMAL HIGH (ref 135–145)
Specific Gravity, Urine: 1.02
TSH: 1.73 microintl units/mL (ref 0.35–5.50)
TSH: 2.68 microintl units/mL (ref 0.35–5.50)
Total Bilirubin: 0.5 mg/dL (ref 0.3–1.2)
Total Bilirubin: 0.5 mg/dL (ref 0.3–1.2)
Total CHOL/HDL Ratio: 5
Total CHOL/HDL Ratio: 5.2
Total Protein: 6.3 g/dL (ref 6.0–8.3)
Total Protein: 6.8 g/dL (ref 6.0–8.3)
Triglycerides: 276 mg/dL — ABNORMAL HIGH (ref 0.0–149.0)
Triglycerides: 296 mg/dL (ref 0–149)
Urobilinogen, UA: 0.2
VLDL: 55.2 mg/dL — ABNORMAL HIGH (ref 0.0–40.0)
VLDL: 59 mg/dL — ABNORMAL HIGH (ref 0–40)
Vit D, 25-Hydroxy: 34 ng/mL (ref 30–89)
WBC: 7.2 10*3/uL (ref 4.5–10.5)
WBC: 7.4 10*3/uL (ref 4.5–10.5)
pH: 5.5

## 2010-08-02 ENCOUNTER — Ambulatory Visit: Payer: Medicare Other | Admitting: Oncology

## 2010-08-02 DIAGNOSIS — C50419 Malignant neoplasm of upper-outer quadrant of unspecified female breast: Secondary | ICD-10-CM

## 2010-08-02 LAB — CBC WITH DIFFERENTIAL/PLATELET
BASO%: 0.4 % (ref 0.0–2.0)
Basophils Absolute: 0 10*3/uL (ref 0.0–0.1)
EOS%: 1.5 % (ref 0.0–7.0)
Eosinophils Absolute: 0.1 10*3/uL (ref 0.0–0.5)
HCT: 35.2 % (ref 34.8–46.6)
HGB: 12.1 g/dL (ref 11.6–15.9)
LYMPH%: 25.6 % (ref 14.0–49.7)
MCH: 30.7 pg (ref 25.1–34.0)
MCHC: 34.5 g/dL (ref 31.5–36.0)
MCV: 89.1 fL (ref 79.5–101.0)
MONO#: 0.6 10*3/uL (ref 0.1–0.9)
MONO%: 7.1 % (ref 0.0–14.0)
NEUT#: 5.4 10*3/uL (ref 1.5–6.5)
NEUT%: 65.4 % (ref 38.4–76.8)
Platelets: 165 10*3/uL (ref 145–400)
RBC: 3.95 10*6/uL (ref 3.70–5.45)
RDW: 12.5 % (ref 11.2–14.5)
WBC: 8.3 10*3/uL (ref 3.9–10.3)
lymph#: 2.1 10*3/uL (ref 0.9–3.3)

## 2010-08-02 NOTE — Assessment & Plan Note (Signed)
Summary: nausea from meds??dm   Vital Signs:  Patient profile:   76 year old female Menstrual status:  postmenopausal Weight:      171 pounds Temp:     98.2 degrees F oral BP sitting:   130 / 84  (left arm) Cuff size:   regular  Vitals Entered By: Kern Reap CMA Duncan Dull) (June 04, 2010 3:30 PM) CC: follow-up visit bp medication Is Patient Diabetic? No   CC:  follow-up visit bp medication.  History of Present Illness: Cecil is a 76 year old female, married, nonsmoker, who comes in today with a possible reaction to Maxzide.  Her blood pressure was very low on her last visit.  Therefore, we stopped her ACE inhibitor, and this gave her some Maxzide 25 mg.  She says i her felnauseated tired.  She stopped it for 3 days, and all her symptoms went away.  She's also concerned about some hair loss  Allergies: 1)  Aspirin (Aspirin) 2)  Codeine Sulfate (Codeine Sulfate)  Past History:  Past medical, surgical, family and social histories (including risk factors) reviewed for relevance to current acute and chronic problems. Past medical history reviewed for relevance to current acute and chronic problems.  Past Medical History: Reviewed history from 04/12/2008 and no changes required. Hyperlipidemia Hypertension Osteopenia Osteoporosis sleep dysfunction Breast cancer, hx of 2007 total left knee replacement GERD  Past Surgical History: Reviewed history from 04/20/2009 and no changes required. arthroscopy R knee 2003 Total knee replacement L  2005 Cataract extraction  09 Total knee replacement  09  Lumpectomy...by mouth rad.  07  Family History: Reviewed history and no changes required.  Social History: Reviewed history from 04/12/2008 and no changes required. Married Never Smoked Alcohol use-no Drug use-no Regular exercise-yes  Review of Systems      See HPI  Physical Exam  General:  Well-developed,well-nourished,in no acute distress; alert,appropriate and  cooperative throughout examination   Impression & Recommendations:  Problem # 1:  HYPERTENSION (ICD-401.9) Assessment Improved  Her updated medication list for this problem includes:    Maxzide-25 37.5-25 Mg Tabs (Triamterene-hctz) .Marland Kitchen... Take 1 tablet by mouth every morning  Complete Medication List: 1)  Prilosec Otc 20 Mg Tbec (Omeprazole magnesium) .... Once daily 2)  Seroquel 25 Mg Tabs (Quetiapine fumarate) .... Take 1 tablet by mouth at bedtime every other night 3)  Remeron 15 Mg Tabs (Mirtazapine) .... Take 1/2 at bedtime every other night 4)  Calcium 500 500 Mg Tabs (Calcium carbonate) .... 2 daily 5)  Aspirin 81 Mg Tbec (Aspirin) .... Once daily 6)  Tamoxifen Citrate 10 Mg Tabs (Tamoxifen citrate) 7)  Lumigan 0.03 % Soln (Bimatoprost) .... At bedtime 8)  Zetia 10 Mg Tabs (Ezetimibe) .... Every morning 9)  Vitamin D3 1000 Unit Caps (Cholecalciferol) .... Take one tab once daily 10)  Maxzide-25 37.5-25 Mg Tabs (Triamterene-hctz) .... Take 1 tablet by mouth every morning 11)  Premarin 0.625 Mg/gm Crea (Estrogens, conjugated) .... Apply 2 x week 12)  Questran Light 4 Gm/dose Powd (Cholestyramine light) .Marland Kitchen.. 1 scoop daily  Patient Instructions: 1)  stop the Maxzide to continue to avoid salt and walk daily.  See Dr. Toni Arthurs for evaluation of your foot problem and Dr. Campbell Stall for evaluation of the hair loss   Orders Added: 1)  Est. Patient Level III [98119]

## 2010-08-02 NOTE — Assessment & Plan Note (Signed)
Summary: R LEG / BACK PAIN // RS   Vital Signs:  Patient profile:   76 year old female Menstrual status:  postmenopausal BP sitting:   108 / 78  (left arm) Cuff size:   regular  Vitals Entered By: Kern Reap CMA Duncan Dull) (Nov 13, 2009 12:23 PM) CC: right leg pain Is Patient Diabetic? No Pain Assessment Patient in pain? yes        CC:  right leg pain.  History of Present Illness: Ronika is a 76 year old female, who comes in today accompanied by her husband for evaluation of right posterior knee pain.  She states she awoke yesterday morning and noticed some pain in the posterior portion of her right knee.  Since, then it's got worse.  No history of trauma.  She's had no chest pain, shortness of breath, et Karie Soda.  She had that knee replaced one year ago  Allergies: 1)  Aspirin (Aspirin) 2)  Codeine Sulfate (Codeine Sulfate)  Past History:  Past medical, surgical, family and social histories (including risk factors) reviewed for relevance to current acute and chronic problems.  Past Medical History: Reviewed history from 04/12/2008 and no changes required. Hyperlipidemia Hypertension Osteopenia Osteoporosis sleep dysfunction Breast cancer, hx of 2007 total left knee replacement GERD  Past Surgical History: Reviewed history from 04/20/2009 and no changes required. arthroscopy R knee 2003 Total knee replacement L  2005 Cataract extraction  09 Total knee replacement  09  Lumpectomy...by mouth rad.  07  Family History: Reviewed history and no changes required.  Social History: Reviewed history from 04/12/2008 and no changes required. Married Never Smoked Alcohol use-no Drug use-no Regular exercise-yes  Review of Systems      See HPI  Physical Exam  General:  Well-developed,well-nourished,in no acute distress; alert,appropriate and cooperative throughout examination Msk:  the right knee appears normal.  The scar is well-healed.  There is no redness or  swelling.  There is tenderness in the posterior fossa   Impression & Recommendations:  Problem # 1:  KNEE PAIN, RIGHT, ACUTE (ICD-719.46) Assessment New  Her updated medication list for this problem includes:    Aspirin 81 Mg Tbec (Aspirin) ..... Once daily  Complete Medication List: 1)  Hydrochlorothiazide 25 Mg Tabs (Hydrochlorothiazide) .... Take 1 tablet by mouth once a day 2)  Zestril 5 Mg Tabs (Lisinopril) .... Take 1 tablet by mouth once a day 3)  Prilosec Otc 20 Mg Tbec (Omeprazole magnesium) .... Once daily 4)  Seroquel 25 Mg Tabs (Quetiapine fumarate) .... Take 1 tablet by mouth at bedtime 5)  Remeron 15 Mg Tabs (Mirtazapine) .... Take 1/2 at bedtime 6)  Calcium 500 500 Mg Tabs (Calcium carbonate) .... 2 daily 7)  Aspirin 81 Mg Tbec (Aspirin) .... Once daily 8)  Tamoxifen Citrate 10 Mg Tabs (Tamoxifen citrate) 9)  Lumigan 0.03 % Soln (Bimatoprost) .... At bedtime 10)  Zetia 10 Mg Tabs (Ezetimibe) .... Every morning 11)  Vitamin D3 1000 Unit Caps (Cholecalciferol) .... Take one tab once daily 12)  Cornbigan  13)  Ocuvite Preservision Tabs (Multiple vitamins-minerals) .... Take 2 tabs once daily 14)  Potassium Chloride Crys Cr 20 Meq Cr-tabs (Potassium chloride crys cr) .... Take one tab three times a day 15)  Septra Ds 800-160 Mg Tabs (Sulfamethoxazole-trimethoprim) .... Take 1 tablet by mouth two times a day  Patient Instructions: 1)  called the orthopedist at [phone] and make an appointment to be seen.  This afternoon

## 2010-08-02 NOTE — Progress Notes (Signed)
Summary: potassium rx  Phone Note Refill Request Message from:  Fax from Pharmacy on September 20, 2008 4:54 PM  Refills Requested: Medication #1:  POTASSIUM CHLORIDE 20 MEQ  PACK 3 tabs once daily Initial call taken by: Kern Reap CMA,  September 20, 2008 4:54 PM      Prescriptions: POTASSIUM CHLORIDE 20 MEQ  PACK (POTASSIUM CHLORIDE) 3 tabs once daily  #300 x 3   Entered by:   Kern Reap CMA   Authorized by:   Roderick Pee MD   Signed by:   Kern Reap CMA on 09/20/2008   Method used:   Electronically to        [facility]* (retail)       [address] [place], [place]  [address]       Ph: [phone] or [phone]       Fax: [phone]   RxID:   [id number]

## 2010-08-02 NOTE — Progress Notes (Signed)
Summary: refills  Phone Note Refill Request Message from:  Fax from Pharmacy on July 27, 2009 10:46 AM  Refills Requested: Medication #1:  SEROQUEL 25 MG  TABS Take 1 tablet by mouth at bedtime  Medication #2:  ZESTRIL 5 MG  TABS Take 1 tablet by mouth once a day  Medication #3:  HYDROCHLOROTHIAZIDE 25 MG  TABS Take 1 tablet by mouth once a day  Medication #4:  POTASSIUM CHLORIDE CRYS CR 20 MEQ CR-TABS take one tab three times a day. Initial call taken by: Kern Reap CMA Duncan Dull),  July 27, 2009 10:47 AM    Prescriptions: POTASSIUM CHLORIDE CRYS CR 20 MEQ CR-TABS (POTASSIUM CHLORIDE CRYS CR) take one tab three times a day  #300 x 3   Entered by:   Kern Reap CMA (AAMA)   Authorized by:   Roderick Pee MD   Signed by:   Kern Reap CMA (AAMA) on 07/27/2009   Method used:   Electronically to        [facility]* (mail-order)       [address] [place], [place]  [address]       Ph: [phone]       Fax: [phone]   RxID:   [id number] ZETIA 10 MG  TABS (EZETIMIBE) every morning  #100 x 3   Entered by:   Kern Reap CMA (AAMA)   Authorized by:   Roderick Pee MD   Signed by:   Kern Reap CMA (AAMA) on 07/27/2009   Method used:   Electronically to        [facility]* (mail-order)       [address] [place], [place]  [address]       Ph: [phone]       Fax: [phone]   RxID:   [id number] REMERON 15 MG  TABS (MIRTAZAPINE) take 1/2 at bedtime  #100 x 2   Entered by:   Kern Reap CMA (AAMA)   Authorized by:   Roderick Pee MD   Signed by:   Kern Reap CMA (AAMA) on 07/27/2009   Method used:   Electronically to        [facility]* (mail-order)       [address] [place], [place]  [address]       Ph: [phone]       Fax: [phone]   RxID:   [id number] SEROQUEL 25 MG  TABS (QUETIAPINE FUMARATE) Take 1 tablet by mouth at bedtime  #100 x 3   Entered by:   Kern Reap CMA (AAMA)   Authorized by:   Roderick Pee MD   Signed by:   Kern Reap CMA (AAMA) on 07/27/2009   Method used:   Electronically to        [facility]* (mail-order)       [address] [place], [place]  [address]       Ph: [phone]       Fax: [phone]   RxID:   [id number] ZESTRIL 5 MG  TABS (LISINOPRIL) Take 1 tablet by mouth once a day  #100 x 3   Entered by:   Kern Reap CMA (AAMA)   Authorized by:   Roderick Pee MD   Signed by:   Kern Reap CMA (AAMA) on 07/27/2009  Method used:   Electronically to        [facility]* (mail-order)       [address] [place], [place]  [address]       Ph: [phone]       Fax: [phone]   RxID:   [id number] HYDROCHLOROTHIAZIDE 25 MG  TABS (HYDROCHLOROTHIAZIDE) Take 1 tablet by mouth once a day  #100 x 3   Entered by:   Kern Reap CMA (AAMA)   Authorized by:   Roderick Pee MD   Signed by:   Kern Reap CMA (AAMA) on 07/27/2009   Method used:   Electronically to        [facility]* (mail-order)       [address] [place], [place]  [address]       Ph: [phone]       Fax: [phone]   RxID:   [id number]

## 2010-08-02 NOTE — Assessment & Plan Note (Signed)
Summary: ear pain/mhf   Vital Signs:  Patient Profile:   76 Years Old Female Height:     63 inches (158.12 cm) Weight:      169 pounds Temp:     98.3 degrees F oral Pulse rate:   80 / minute Pulse rhythm:   regular BP sitting:   110 / 70  (left arm) Cuff size:   regular  Vitals Entered By: Kern Reap CMA (April 25, 2008 12:22 PM)                 Chief Complaint:  right ear pain.  History of Present Illness: Jacqueline [name] is a 76 year old, married female, who comes in today for evaluation of sharp pain in her right ear for one day.  Approximately 3 p.m. yesterday.  She was at home sitting reading in a short pain in her right ear.  Since that time.  The pain as being constant pain.  She had dental work done a week ago and has a temporary crown.  She denies any history of bruxism.  Review of systems negative    Prior Medication List:  HYDROCHLOROTHIAZIDE 25 MG  TABS (HYDROCHLOROTHIAZIDE) Take 1 tablet by mouth once a day ZESTRIL 5 MG  TABS (LISINOPRIL) Take 1 tablet by mouth once a day PRILOSEC OTC 20 MG  TBEC (OMEPRAZOLE MAGNESIUM) once daily FOSAMAX 35 MG  TABS (ALENDRONATE SODIUM) weekly SEROQUEL 25 MG  TABS (QUETIAPINE FUMARATE) Take 1 tablet by mouth at bedtime REMERON 15 MG  TABS (MIRTAZAPINE) take 1/2 at bedtime POTASSIUM CHLORIDE 20 MEQ  PACK (POTASSIUM CHLORIDE) 3 tabs once daily CALCIUM 500 500 MG  TABS (CALCIUM CARBONATE) 2 daily ASPIRIN 81 MG  TBEC (ASPIRIN) once daily TAMOXIFEN CITRATE 10 MG  TABS (TAMOXIFEN CITRATE)  LUMIGAN 0.03 %  SOLN (BIMATOPROST) at bedtime ZETIA 10 MG  TABS (EZETIMIBE) every morning VITAMIN D3 1000 UNIT CAPS (CHOLECALCIFEROL) take one tab once daily * CORNBIGAN    Current Allergies (reviewed today): ASPIRIN (ASPIRIN) CODEINE SULFATE (CODEINE SULFATE)  Past Medical History:    Reviewed history from 04/12/2008 and no changes required:       Hyperlipidemia       Hypertension       Osteopenia       Osteoporosis       sleep  dysfunction       Breast cancer, hx of 2007       total left knee replacement       GERD   Social History:    Reviewed history from 04/12/2008 and no changes required:       Married       Never Smoked       Alcohol use-no       Drug use-no       Regular exercise-yes    Review of Systems      See HPI   Physical Exam  General:     Well-developed,well-nourished,in no acute distress; alert,appropriate and cooperative throughout examination Head:     Normocephalic and atraumatic without obvious abnormalities. No apparent alopecia or balding. Eyes:     No corneal or conjunctival inflammation noted. EOMI. Perrla. Funduscopic exam benign, without hemorrhages, exudates or papilledema. Vision grossly normal. Ears:     External ear exam shows no significant lesions or deformities.  Otoscopic examination reveals clear canals, tympanic membranes are intact bilaterally without bulging, retraction, inflammation or discharge. Hearing is grossly normal bilaterally. Nose:     External nasal examination shows no deformity or inflammation.  Nasal mucosa are pink and moist without lesions or exudates. Mouth:     Oral mucosa and oropharynx without lesions or exudates.  Teeth in good repair. Msk:     tender right TM, consistent with TMJ syndrome    Impression & Recommendations:  Problem # 1:  TMJ SYNDROME (ICD-524.60) Assessment: New  Complete Medication List: 1)  Hydrochlorothiazide 25 Mg Tabs (Hydrochlorothiazide) .... Take 1 tablet by mouth once a day 2)  Zestril 5 Mg Tabs (Lisinopril) .... Take 1 tablet by mouth once a day 3)  Prilosec Otc 20 Mg Tbec (Omeprazole magnesium) .... Once daily 4)  Fosamax 35 Mg Tabs (Alendronate sodium) .... Weekly 5)  Seroquel 25 Mg Tabs (Quetiapine fumarate) .... Take 1 tablet by mouth at bedtime 6)  Remeron 15 Mg Tabs (Mirtazapine) .... Take 1/2 at bedtime 7)  Potassium Chloride 20 Meq Pack (Potassium chloride) .... 3 tabs once daily 8)  Calcium 500 500  Mg Tabs (Calcium carbonate) .... 2 daily 9)  Aspirin 81 Mg Tbec (Aspirin) .... Once daily 10)  Tamoxifen Citrate 10 Mg Tabs (Tamoxifen citrate) 11)  Lumigan 0.03 % Soln (Bimatoprost) .... At bedtime 12)  Zetia 10 Mg Tabs (Ezetimibe) .... Every morning 13)  Vitamin D3 1000 Unit Caps (Cholecalciferol) .... Take one tab once daily 14)  Cornbigan  15)  Vicodin Es 7.5-750 Mg Tabs (Hydrocodone-acetaminophen) .... Take 1 tablet by mouth three times a day as needed   Patient Instructions: 1)  take 600 mg of Motrin 3 times a day with food.  You may also take Vicodin one half or one tablet up to 3 times a day as needed for severe pain   Prescriptions: VICODIN ES 7.5-750 MG TABS (HYDROCODONE-ACETAMINOPHEN) Take 1 tablet by mouth three times a day as needed  #40 x 1   Entered and Authorized by:   Roderick Pee MD   Signed by:   Roderick Pee MD on 04/25/2008   Method used:   Print then Give to Patient   RxID:   [id number]  ]

## 2010-08-02 NOTE — Progress Notes (Signed)
Summary: glucose and blood pressure fyi  Phone Note Call from Patient   Summary of Call: patient came into the office for a random glucose check.  her glucose was 92 and her blood pressure was 120/80.   Initial call taken by: Kern Reap CMA Duncan Dull),  January 10, 2010 11:59 AM  Follow-up for Phone Call        BP and blood sugar normal continue current med program.  Check blood sugar weekly, however, check blood pressure daily in the morning.  Call p.r.n. Follow-up by: Roderick Pee MD,  January 11, 2010 8:33 AM  Additional Follow-up for Phone Call Additional follow up Details #1::        patient is aware and glucose monitor is given  Additional Follow-up by: Kern Reap CMA Duncan Dull),  January 11, 2010 5:30 PM    New/Updated Medications: ACCU-CHEK ADVANTAGE DIABETES  KIT (BLOOD GLUCOSE MONITORING SUPPL)

## 2010-08-02 NOTE — Assessment & Plan Note (Signed)
Summary: vaginal bleeding/cjr   Vital Signs:  Patient profile:   76 year old female Menstrual status:  postmenopausal Weight:      165 pounds Temp:     98.6 degrees F oral BP sitting:   130 / 84  (right arm) Cuff size:   regular  Vitals Entered By: Kern Reap CMA Duncan Dull) (January 04, 2010 3:54 PM) CC: vaginal bleeding, lightheaded   CC:  vaginal bleeding and lightheaded.  History of Present Illness: Jacqueline [name] is a 76 year old, married female, nonsmoker, who comes in today for evaluation of postmenopausal vaginal bleeding x 4 days.  She has a history of breast cancer and has been on tamoxifen via Dr. Donnie Coffin.  X 4 years.  This past Monday she noticed some vaginal bleeding.  Review of systems negative  Allergies: 1)  Aspirin (Aspirin) 2)  Codeine Sulfate (Codeine Sulfate)  Past History:  Past medical, surgical, family and social histories (including risk factors) reviewed for relevance to current acute and chronic problems.  Past Medical History: Reviewed history from 04/12/2008 and no changes required. Hyperlipidemia Hypertension Osteopenia Osteoporosis sleep dysfunction Breast cancer, hx of 2007 total left knee replacement GERD  Past Surgical History: Reviewed history from 04/20/2009 and no changes required. arthroscopy R knee 2003 Total knee replacement L  2005 Cataract extraction  09 Total knee replacement  09  Lumpectomy...by mouth rad.  07  Family History: Reviewed history and no changes required.  Social History: Reviewed history from 04/12/2008 and no changes required. Married Never Smoked Alcohol use-no Drug use-no Regular exercise-yes  Review of Systems      See HPI  Physical Exam  General:  Well-developed,well-nourished,in no acute distress; alert,appropriate and cooperative throughout examination Abdomen:  Bowel sounds positive,abdomen soft and non-tender without masses, organomegaly or hernias noted. Genitalia:  external genitalia within  normal limits for age.  Vaginal vault normal.  There is blood coming from the cervical os.  Bimanual exam negative   Impression & Recommendations:  Problem # 1:  POSTMENOPAUSAL BLEEDING (ICD-627.1) Assessment New  Complete Medication List: 1)  Hydrochlorothiazide 25 Mg Tabs (Hydrochlorothiazide) .... Take 1 tablet by mouth once a day 2)  Zestril 5 Mg Tabs (Lisinopril) .... Take 1 tablet by mouth once a day 3)  Prilosec Otc 20 Mg Tbec (Omeprazole magnesium) .... Once daily 4)  Seroquel 25 Mg Tabs (Quetiapine fumarate) .... Take 1 tablet by mouth at bedtime 5)  Remeron 15 Mg Tabs (Mirtazapine) .... Take 1/2 at bedtime 6)  Calcium 500 500 Mg Tabs (Calcium carbonate) .... 2 daily 7)  Aspirin 81 Mg Tbec (Aspirin) .... Once daily 8)  Tamoxifen Citrate 10 Mg Tabs (Tamoxifen citrate) 9)  Lumigan 0.03 % Soln (Bimatoprost) .... At bedtime 10)  Zetia 10 Mg Tabs (Ezetimibe) .... Every morning 11)  Vitamin D3 1000 Unit Caps (Cholecalciferol) .... Take one tab once daily 12)  Cornbigan  13)  Ocuvite Preservision Tabs (Multiple vitamins-minerals) .... Take 2 tabs once daily 14)  Potassium Chloride Crys Cr 20 Meq Cr-tabs (Potassium chloride crys cr) .... Take one tab three times a day  Patient Instructions: 1)  call Dr. Arlyce Dice at [facility]. 2)  Schedule a 30 minute appointment to see me next week to discuss your other concerns.

## 2010-08-02 NOTE — Progress Notes (Signed)
Summary: dizzy and weak  Phone Note Call from Patient   Caller: Patient Call For: Roderick Pee MD Summary of Call: BP readings have been..  Off Zestril x 2 weeks.  BP:  as follows 100/76/ 109/72, 110/76, 100/71, 120/77, 117/80, 11/78, 106/68, 108/75, 109/73 BS are running 92, 94   Complaining of weakness and dizzness in the am mostly.  Symptoms a little better but still is very dizzy. [id number] Also, complaining of headache. Initial call taken by: Lynann Beaver CMA,  January 29, 2010 10:27 AM  Follow-up for Phone Call        stopped the hydrochlorothiazide continue to check your blood pressure weekly follow-up office visit in one week if still symptomatic Follow-up by: Roderick Pee MD,  January 29, 2010 10:33 AM  Additional Follow-up for Phone Call Additional follow up Details #1::        what about potassium?  Pt. notified. Lynann Beaver CMA  January 29, 2010 11:04 AM   Additional Follow-up by: Lynann Beaver CMA,  January 29, 2010 10:35 AM    Additional Follow-up for Phone Call Additional follow up Details #2::    stop potassium also Follow-up by: Roderick Pee MD,  January 29, 2010 10:54 AM  Additional Follow-up for Phone Call Additional follow up Details #3:: Details for Additional Follow-up Action Taken: Pt. notified. Additional Follow-up by: Lynann Beaver CMA,  January 29, 2010 11:07 AM

## 2010-08-02 NOTE — Progress Notes (Signed)
Summary: Pts mail order pharmacy has changed to [facility]  Phone Note Call from Patient Call back at [facility] Phone [phone] Call back at [phone] cell   Caller: Patient Summary of Call: Pt called and said that her mail order pharmacy has changd to [facility]. Pls be sure to send meds there and not to [facility].    Initial call taken by: Lucy Antigua,  April 23, 2010 11:22 AM    Prescriptions: Lanetta Inch LIGHT 4 GM/DOSE POWD (CHOLESTYRAMINE LIGHT) 1 scoop daily  #1 can x 11   Entered by:   Kern Reap CMA (AAMA)   Authorized by:   Roderick Pee MD   Signed by:   Kern Reap CMA (AAMA) on 04/23/2010   Method used:   Electronically to        [facility]* (mail-order)       [address] [place], [place]  [address]       Ph: [phone]       Fax: [phone]   RxID:   [id number] PREMARIN 0.625 MG/GM CREA (ESTROGENS, CONJUGATED) apply 2 x week  #3 tubes x 3   Entered by:   Kern Reap CMA (AAMA)   Authorized by:   Roderick Pee MD   Signed by:   Kern Reap CMA (AAMA) on 04/23/2010   Method used:   Electronically to        [facility]* (mail-order)       [address] [place], [place]  [address]       Ph: [phone]       Fax: [phone]   RxID:   [id number] MAXZIDE-25 37.5-25 MG TABS (TRIAMTERENE-HCTZ) Take 1 tablet by mouth every morning  #100 x 3   Entered by:   Kern Reap CMA (AAMA)   Authorized by:   Roderick Pee MD   Signed by:   Kern Reap CMA (AAMA) on 04/23/2010   Method used:   Electronically to        [facility]* (mail-order)       [address] [place], [place]  [address]       Ph: [phone]       Fax: [phone]   RxID:   [id number] ZETIA 10 MG  TABS (EZETIMIBE) every morning  #100 x 3   Entered by:   Kern Reap CMA (AAMA)   Authorized by:   Roderick Pee MD   Signed by:   Kern Reap CMA (AAMA) on 04/23/2010   Method used:   Electronically  to        [facility]* (mail-order)       [address] [place], [place]  [address]       Ph: [phone]       Fax: [phone]   RxID:   [id number] REMERON 15 MG  TABS (MIRTAZAPINE) take 1/2 at bedtime every other night  #50 x 3   Entered by:   Kern Reap CMA (AAMA)   Authorized by:   Roderick Pee MD   Signed by:   Kern Reap CMA (AAMA) on 04/23/2010   Method used:   Electronically to        [facility]* (mail-order)       [address]       [place], [place]  [address]       Ph: [phone]       Fax: [phone]   RxID:   [id number] SEROQUEL 25 MG  TABS (QUETIAPINE FUMARATE) Take 1 tablet by mouth at bedtime every other night  #100 x 3   Entered by:   Kern Reap CMA (AAMA)   Authorized by:   Roderick Pee MD   Signed by:   Kern Reap CMA (AAMA) on 04/23/2010   Method used:   Electronically to        [facility]* (mail-order)       [address] [place], [place]  [address]       Ph: [phone]       Fax: [phone]   RxID:   [id number] PRILOSEC OTC 20 MG  TBEC (OMEPRAZOLE MAGNESIUM) once daily  #100 x 3   Entered by:   Kern Reap CMA (AAMA)   Authorized by:   Roderick Pee MD   Signed by:   Kern Reap CMA (AAMA) on 04/23/2010   Method used:   Electronically to        [facility]* (mail-order)       [address] [place], [place]  [address]       Ph: [phone]       Fax: [phone]   RxID:   [id number]

## 2010-08-02 NOTE — Progress Notes (Signed)
Summary: [facility]  Phone Note Call from Patient Call back at Home Phone [phone]   Caller: VM Summary of Call: Med should have been sent to [facility] mail order not [facility] local [facility].  The lady who took my info had it correct, but somehow Rxs were sent to local [facility].  Initial call taken by: Rudy Jew, RN,  April 25, 2010 3:31 PM  Follow-up for Phone Call        rx sent to [facility] yesterday Follow-up by: Kern Reap CMA Duncan Dull),  April 26, 2010 11:56 AM

## 2010-08-02 NOTE — Assessment & Plan Note (Signed)
Summary: PT FASTING/CPX/RCD   Vital Signs:  Patient Profile:   76 Years Old Female Height:     62.25 inches (158.12 cm) Weight:      164 pounds (74.55 kg) BMI:     29.86 Temp:     97.9 degrees F (36.61 degrees C) oral Pulse rate:   70 / minute Pulse rhythm:   regular BP sitting:   126 / 62  (left arm)  Pt. in pain?   no  Vitals Entered By: Arcola Jansky, RN (April 08, 2007 8:37 AM)                  Chief Complaint:  ov fasting labs, pap , and denies problems.  History of Present Illness: Jacqueline [name] is a 76 year old, married female, comes in today for physical UA and because of underlying hypertension, osteoporosis, hyperlipidemia, one year status post breast cancer, and sleep dysfunction.  We saw her last year for general physical everything was normal.  Screening mammogram showed a mass in her left breast.  It was nonpalpable by me or the patient.  Diagnostic evaluation should a malignancy.  It was excised by Dr. Jamey Ripa, and she is undergoing outpatient therapy by Dr. Donnie Coffin.  Her medical oncologist.  She is doing well and has no problems.  Her current medication is tamoxifen 20 mg a day.  She is up on her health maintenance activities, except she is due a pneumonia shot and a flu shot, which we will give her today.  Acute Visit History:      She denies abdominal pain, chest pain, constipation, cough, diarrhea, earache, eye symptoms, fever, genitourinary symptoms, headache, musculoskeletal symptoms, nasal discharge, nausea, rash, sinus problems, sore throat, and vomiting.         Current Allergies: ASPIRIN (ASPIRIN) CODEINE SULFATE (CODEINE SULFATE)  Past Medical History:    Reviewed history from 03/04/2007 and no changes required:       Hyperlipidemia       Hypertension       Osteopenia       Osteoporosis       sleep dysfunction       Breast cancer, hx of 2007   Family History:    Reviewed history and no changes required:  Social History:    Reviewed  history and no changes required:   Risk Factors:  Tobacco use:  never Alcohol use:  no Exercise:  yes   Review of Systems      See HPI   Physical Exam  General:     Well-developed,well-nourished,in no acute distress; alert,appropriate and cooperative throughout examination Head:     Normocephalic and atraumatic without obvious abnormalities. No apparent alopecia or balding. Eyes:     No corneal or conjunctival inflammation noted. EOMI. Perrla. Funduscopic exam benign, without hemorrhages, exudates or papilledema. Vision grossly normal. Ears:     External ear exam shows no significant lesions or deformities.  Otoscopic examination reveals clear canals, tympanic membranes are intact bilaterally without bulging, retraction, inflammation or discharge. Hearing is grossly normal bilaterally. Nose:     External nasal examination shows no deformity or inflammation. Nasal mucosa are pink and moist without lesions or exudates. Mouth:     Oral mucosa and oropharynx without lesions or exudates.  Teeth in good repair. Neck:     No deformities, masses, or tenderness noted. Chest Wall:     No deformities, masses, or tenderness noted. Breasts:     progress is normal.  Left breast the scar  from previous biopsy years ago also, a second scar in the lateral position with a malignancy was removed and a 7.  No scars are well healed and I can palpate no abnormalities. Lungs:     Normal respiratory effort, chest expands symmetrically. Lungs are clear to auscultation, no crackles or wheezes. Heart:     Normal rate and regular rhythm. S1 and S2 normal without gallop, murmur, click, rub or other extra sounds. Abdomen:     Bowel sounds positive,abdomen soft and non-tender without masses, organomegaly or hernias noted. Rectal:     No external abnormalities noted. Normal sphincter tone. No rectal masses or tenderness. Genitalia:     Pelvic Exam:        External: normal female genitalia without lesions  or masses        Vagina: normal without lesions or masses        Cervix: normal without lesions or masses        Adnexa: normal bimanual exam without masses or fullness        Uterus: normal by palpation        Pap smear: performed Msk:     No deformity or scoliosis noted of thoracic or lumbar spine.   Pulses:     R and L carotid,radial,femoral,dorsalis pedis and posterior tibial pulses are full and equal bilaterally Extremities:     No clubbing, cyanosis, edema, or deformity noted with normal full range of motion of all joints.   Neurologic:     No cranial nerve deficits noted. Station and gait are normal. Plantar reflexes are down-going bilaterally. DTRs are symmetrical throughout. Sensory, motor and coordinative functions appear intact. Skin:     Intact without suspicious lesions or rashes Cervical Nodes:     No lymphadenopathy noted Axillary Nodes:     No palpable lymphadenopathy Inguinal Nodes:     No significant adenopathy Psych:     Cognition and judgment appear intact. Alert and cooperative with normal attention span and concentration. No apparent delusions, illusions, hallucinations    Impression & Recommendations:  Problem # 1:  BREAST CANCER, HX OF (ICD-V10.3) Assessment: Improved  Orders: Venipuncture ([address]) TLB-Lipid Panel (80061-LIPID) TLB-BMP (Basic Metabolic Panel-BMET) (80048-METABOL) TLB-CBC Platelet - w/Differential (85025-CBCD) TLB-Hepatic/Liver Function Pnl (80076-HEPATIC) TLB-TSH (Thyroid Stimulating Hormone) (84443-TSH)   Problem # 2:  DYSFUNCTION, SLEEP STATE NEC (ICD-307.47) Assessment: Improved  Orders: Venipuncture ([address]) TLB-Lipid Panel (80061-LIPID) TLB-BMP (Basic Metabolic Panel-BMET) (80048-METABOL) TLB-CBC Platelet - w/Differential (85025-CBCD) TLB-Hepatic/Liver Function Pnl (80076-HEPATIC) TLB-TSH (Thyroid Stimulating Hormone) (84443-TSH)   Problem # 3:  OSTEOPOROSIS (ICD-733.00) Assessment: Unchanged lot [id number], EXP 30 jun  09, sanofi pasteur left deltoid IM, 0.5 cc.  Her updated medication list for this problem includes:    Fosamax 35 Mg Tabs (Alendronate sodium) .Marland Kitchen... Weekly    Calcium 500 500 Mg Tabs (Calcium carbonate) .Marland Kitchen... 2 daily    Vitamin D 400 Unit Caps (Cholecalciferol) ..... Once daily  Orders: Venipuncture ([address]) TLB-Lipid Panel (80061-LIPID) TLB-BMP (Basic Metabolic Panel-BMET) (80048-METABOL) TLB-CBC Platelet - w/Differential (85025-CBCD) TLB-Hepatic/Liver Function Pnl (80076-HEPATIC) TLB-TSH (Thyroid Stimulating Hormone) (84443-TSH)   Problem # 4:  HYPERTENSION (ICD-401.9) Assessment: Unchanged  Her updated medication list for this problem includes:    Hydrochlorothiazide 25 Mg Tabs (Hydrochlorothiazide) .Marland Kitchen... Take 1 tablet by mouth once a day    Zestril 5 Mg Tabs (Lisinopril) .Marland Kitchen... Take 1 tablet by mouth once a day  Orders: EKG w/ Interpretation (93[address]) UA Dipstick w/o Micro (81002) Venipuncture (91478) TLB-Lipid Panel (80061-LIPID) TLB-BMP (Basic Metabolic Panel-BMET) (80048-METABOL) TLB-CBC  Platelet - w/Differential (85025-CBCD) TLB-Hepatic/Liver Function Pnl (80076-HEPATIC) TLB-TSH (Thyroid Stimulating Hormone) (84443-TSH)   Problem # 5:  HYPERLIPIDEMIA (ICD-272.4) Assessment: Unchanged  Her updated medication list for this problem includes:    Zetia 10 Mg Tabs (Ezetimibe) ..... Every morning  Orders: Venipuncture ([address]) TLB-Lipid Panel (80061-LIPID) TLB-BMP (Basic Metabolic Panel-BMET) (80048-METABOL) TLB-CBC Platelet - w/Differential (85025-CBCD) TLB-Hepatic/Liver Function Pnl (80076-HEPATIC) TLB-TSH (Thyroid Stimulating Hormone) (84443-TSH)   Complete Medication List: 1)  Hydrochlorothiazide 25 Mg Tabs (Hydrochlorothiazide) .... Take 1 tablet by mouth once a day 2)  Zestril 5 Mg Tabs (Lisinopril) .... Take 1 tablet by mouth once a day 3)  Prilosec Otc 20 Mg Tbec (Omeprazole magnesium) .... Once daily 4)  Fosamax 35 Mg Tabs (Alendronate sodium) ....  Weekly 5)  Seroquel 25 Mg Tabs (Quetiapine fumarate) .... Take 1 tablet by mouth at bedtime 6)  Remeron 15 Mg Tabs (Mirtazapine) .... Take 1/2 at bedtime 7)  Potassium Chloride 20 Meq Pack (Potassium chloride) .... 3 tabs once daily 8)  Calcium 500 500 Mg Tabs (Calcium carbonate) .... 2 daily 9)  Vitamin D 400 Unit Caps (Cholecalciferol) .... Once daily 10)  Aspirin 81 Mg Tbec (Aspirin) .... Once daily 11)  Tamoxifen Citrate 10 Mg Tabs (Tamoxifen citrate) 12)  Lumigan 0.03 % Soln (Bimatoprost) .... At bedtime 13)  Zetia 10 Mg Tabs (Ezetimibe) .... Every morning  Other Orders: Flu Vaccine 12yrs + ([address]) Admin 1st Vaccine (09811) Pneumococcal Vaccine (91478) Admin of Any Addtl Vaccine ([address])   Patient Instructions: 1)  Please schedule a follow-up appointment in 1 year. 2)  It is important that you exercise regularly at least 20 minutes 5 times a week. If you develop chest pain, have severe difficulty breathing, or feel very tired , stop exercising immediately and seek medical attention. 3)  Take calcium +Vitamin D daily. 4)  Take an Aspirin every day.    Prescriptions: ZETIA 10 MG  TABS (EZETIMIBE) every morning  #100 x 4   Entered and Authorized by:   Roderick Pee MD   Signed by:   Roderick Pee MD on 04/08/2007   Method used:   Print then Give to Patient   RxID:   [id number] POTASSIUM CHLORIDE 20 MEQ  PACK (POTASSIUM CHLORIDE) 3 tabs once daily  #300 x 4   Entered and Authorized by:   Roderick Pee MD   Signed by:   Roderick Pee MD on 04/08/2007   Method used:   Print then Give to Patient   RxID:   [id number] REMERON 15 MG  TABS (MIRTAZAPINE) take 1/2 at bedtime  #100 x 4   Entered and Authorized by:   Roderick Pee MD   Signed by:   Roderick Pee MD on 04/08/2007   Method used:   Print then Give to Patient   RxID:   [id number] SEROQUEL 25 MG  TABS (QUETIAPINE FUMARATE) Take 1 tablet by mouth at bedtime  #100 x 4   Entered and Authorized by:    Roderick Pee MD   Signed by:   Roderick Pee MD on 04/08/2007   Method used:   Print then Give to Patient   RxID:   [id number] FOSAMAX 35 MG  TABS (ALENDRONATE SODIUM) weekly  #12 x 4   Entered and Authorized by:   Roderick Pee MD   Signed by:   Roderick Pee MD on 04/08/2007   Method used:   Print then Give to Patient   RxID:  [id number] PRILOSEC OTC 20 MG  TBEC (OMEPRAZOLE MAGNESIUM) once daily  #100 x 4   Entered and Authorized by:   Roderick Pee MD   Signed by:   Roderick Pee MD on 04/08/2007   Method used:   Print then Give to Patient   RxID:   [id number] ZESTRIL 5 MG  TABS (LISINOPRIL) Take 1 tablet by mouth once a day  #100 x 4   Entered and Authorized by:   Roderick Pee MD   Signed by:   Roderick Pee MD on 04/08/2007   Method used:   Print then Give to Patient   RxID:   [id number] HYDROCHLOROTHIAZIDE 25 MG  TABS (HYDROCHLOROTHIAZIDE) Take 1 tablet by mouth once a day  #100 x 4   Entered and Authorized by:   Roderick Pee MD   Signed by:   Roderick Pee MD on 04/08/2007   Method used:   Print then Give to Patient   RxID:   [id number] ZETIA 10 MG  TABS (EZETIMIBE) every morning  #100 x 4   Entered and Authorized by:   Roderick Pee MD   Signed by:   Roderick Pee MD on 04/08/2007   Method used:   [facility] ...       [facility] [phone] [place] [address]       [address]       [place], [place]  [address]       Ph: [phone] or [phone]       Fax: [phone]   RxID:   [id number] POTASSIUM CHLORIDE 20 MEQ  PACK (POTASSIUM CHLORIDE) 3 tabs once daily  #300 x 4   Entered and Authorized by:   Roderick Pee MD   Signed by:   Roderick Pee MD on 04/08/2007   Method used:   Electronically sent to ...       [facility] [phone] [place] [address]       [address]       [place], [place]  [address]       Ph: [phone] or [phone]       Fax: [phone]   RxID:   [id number] REMERON 15 MG  TABS (MIRTAZAPINE) take  1/2 at bedtime  #100 x 4   Entered and Authorized by:   Roderick Pee MD   Signed by:   Roderick Pee MD on 04/08/2007   Method used:   Electronically sent to ...       [facility] [phone] [place] [address]       [address]       [place], [place]  [address]       Ph: [phone] or [phone]       Fax: [phone]   RxID:   [id number] SEROQUEL 25 MG  TABS (QUETIAPINE FUMARATE) Take 1 tablet by mouth at bedtime  #100 x 4   Entered and Authorized by:   Roderick Pee MD   Signed by:   Roderick Pee MD on 04/08/2007   Method used:   Electronically sent to ...       [facility] [phone] [place] [address]*       [address]       [place], [place]  [address]       Ph: [phone] or [phone]       Fax: [phone]   RxID:   [id number] FOSAMAX 35 MG  TABS (  ALENDRONATE SODIUM) weekly  #12 x 4   Entered and Authorized by:   Roderick Pee MD   Signed by:   Roderick Pee MD on 04/08/2007   Method used:   Electronically sent to ...       [facility] [phone] [place] [address]*       [address]       [place], [place]  [address]       Ph: [phone] or [phone]       Fax: [phone]   RxID:   [id number] PRILOSEC OTC 20 MG  TBEC (OMEPRAZOLE MAGNESIUM) once daily  #100 x 4   Entered and Authorized by:   Roderick Pee MD   Signed by:   Roderick Pee MD on 04/08/2007   Method used:   Electronically sent to ...       [facility] [phone] [place] [address]*       [address]       [place], [place]  [address]       Ph: [phone] or [phone]       Fax: [phone]   RxID:   [id number] ZESTRIL 5 MG  TABS (LISINOPRIL) Take 1 tablet by mouth once a day  #100 x 4   Entered and Authorized by:   Roderick Pee MD   Signed by:   Roderick Pee MD on 04/08/2007   Method used:   Electronically sent to ...       [facility] [phone] [place] [address]*       [address]       [place], [place]  [address]       Ph: [phone] or [phone]       Fax: [phone]   RxID:   [id number] HYDROCHLOROTHIAZIDE  25 MG  TABS (HYDROCHLOROTHIAZIDE) Take 1 tablet by mouth once a day  #100 x 4   Entered and Authorized by:   Roderick Pee MD   Signed by:   Roderick Pee MD on 04/08/2007   Method used:   Electronically sent to ...       [facility] [phone] [place] [address]       [address]       [place], [place]  [address]       Ph: [phone] or [phone]       Fax: [phone]   RxID:   [id number]  And in and a]  Pneumovax Vaccine    Vaccine Type: Pneumovax    Site: right deltoid    Mfr: Merck    Dose: 0.5 ml    Route: IM    Given by: Arcola Jansky, RN    Exp. Date: 09/11/2008    Lot #: [id number]    VIS given: 01/27/96 version given April 08, 2007.  Influenza Vaccine    Vaccine Type: Fluvax 3+    Given by: Arcola Jansky, RN  Flu Vaccine Consent Questions    Do you have a history of severe allergic reactions to this vaccine? no    Any prior history of allergic reactions to egg and/or gelatin? no    Do you have a sensitivity to the preservative Thimersol? no    Do you have a past history of Guillan-Barre Syndrome? no    Do you currently have an acute febrile illness? no    Have you ever had a severe reaction to latex? no    Vaccine information given and explained  to patient? yes    Are you currently pregnant? no    Appended Document: PT FASTING/CPX/RCD  Laboratory Results   Urine Tests    Routine Urinalysis   Color: yellow Appearance: Clear Glucose: negative   (Normal Range: Negative) Bilirubin: negative   (Normal Range: Negative) Ketone: negative   (Normal Range: Negative) Spec. Gravity: 1.020   (Normal Range: 1.003-1.035) Blood: negative   (Normal Range: Negative) pH: 5.0   (Normal Range: 5.0-8.0) Protein: negative   (Normal Range: Negative) Urobilinogen: 0.2   (Normal Range: 0-1) Nitrite: negative   (Normal Range: Negative) Leukocyte Esterace: trace   (Normal Range: Negative)    Comments:  ...................................................................Jacqueline [name]  April 08, 2007 12:49 PM

## 2010-08-02 NOTE — Assessment & Plan Note (Signed)
Summary: pt will come in fasting/njr   Vital Signs:  Patient profile:   76 year old female Menstrual status:  postmenopausal Height:      63 inches Weight:      166 pounds BMI:     29.51 Temp:     98.2 degrees F oral BP sitting:   110 / 80  (left arm) Cuff size:   large  Vitals Entered By: Kern Reap CMA Duncan Dull) (April 20, 2009 10:58 AM)  Reason for Visit cpx  History of Present Illness: Jacqueline [name]  is a 76 year old, married female, nonsmoker, who comes in today for physical evaluation because of numerous issues.  She is scattered history of breast cancer, left-sided.  She said surgery.  Postop radiation.  She clinically stable.  Follow-up by oncology and surgery on q.6 month basis.  Now.  She takes tamoxifen 10 mg daily.  She also has underlying hypertension, for which he takes Zestril, 5 mg daily, Rhinocort, thiazide 25 mg daily, and 320 mEq potassium tablets daily.  BP 110/80.  She also takes Prilosec OTC for reflux esophagitis.  She also takes Seroquel 25 mg and Remeron 15 mg dose one half nightly for sleep dysfunction.  She also takes Zetia 10 mg nightly for hyperlipidemia, along with an 81 mg, baby aspirin.  This past year.  She had a right cataract removed.  She seeing much better.  She does have a right TKR.  Doing well able to ambulate much better now.  Tetanus Mr. 2004, pneumonia vaccine 2008, seasonal flu shot today.  Allergies: 1)  Aspirin (Aspirin) 2)  Codeine Sulfate (Codeine Sulfate)  Past History:  Past medical, surgical, family and social histories (including risk factors) reviewed, and no changes noted (except as noted below).  Past Medical History: Reviewed history from 04/12/2008 and no changes required. Hyperlipidemia Hypertension Osteopenia Osteoporosis sleep dysfunction Breast cancer, hx of 2007 total left knee replacement GERD  Past Surgical History: arthroscopy R knee 2003 Total knee replacement L  2005 Cataract extraction  09 Total  knee replacement  09  Lumpectomy...by mouth rad.  07  Family History: Reviewed history and no changes required.  Social History: Reviewed history from 04/12/2008 and no changes required. Married Never Smoked Alcohol use-no Drug use-no Regular exercise-yes  Review of Systems      See HPI  Physical Exam  General:  Well-developed,well-nourished,in no acute distress; alert,appropriate and cooperative throughout examination Head:  Normocephalic and atraumatic without obvious abnormalities. No apparent alopecia or balding. Eyes:  No corneal or conjunctival inflammation noted. EOMI. Perrla. Funduscopic exam benign, without hemorrhages, exudates or papilledema. Vision grossly normal.lens implant, right eye Ears:  External ear exam shows no significant lesions or deformities.  Otoscopic examination reveals clear canals, tympanic membranes are intact bilaterally without bulging, retraction, inflammation or discharge. Hearing is grossly normal bilaterally. Nose:  External nasal examination shows no deformity or inflammation. Nasal mucosa are pink and moist without lesions or exudates. Mouth:  Oral mucosa and oropharynx without lesions or exudates.  Teeth in good repair. Neck:  No deformities, masses, or tenderness noted. Chest Wall:  No deformities, masses, or tenderness noted. Breasts:  right breast normal left breast scar at the 12 o'clock position from previous lumpectomy.  Skin changes from radiation treatment.  Otherwise, exam ok Lungs:  Normal respiratory effort, chest expands symmetrically. Lungs are clear to auscultation, no crackles or wheezes. Heart:  Normal rate and regular rhythm. S1 and S2 normal without gallop, murmur, click, rub or other extra sounds. Abdomen:  Bowel sounds positive,abdomen soft and non-tender without masses, organomegaly or hernias noted. Rectal:  No external abnormalities noted. Normal sphincter tone. No rectal masses or tenderness. Genitalia:  Pelvic Exam:         External: normal female genitalia without lesions or masses        Vagina: normal without lesions or masses        Cervix: normal without lesions or masses        Adnexa: normal bimanual exam without masses or fullness        Uterus: normal by palpation        Pap smear: performed Msk:  No deformity or scoliosis noted of thoracic or lumbar spine.   Pulses:  R and L carotid,radial,femoral,dorsalis pedis and posterior tibial pulses are full and equal bilaterally Extremities:  No clubbing, cyanosis, edema, or deformity noted with normal full range of motion of all joints.   Neurologic:  No cranial nerve deficits noted. Station and gait are normal. Plantar reflexes are down-going bilaterally. DTRs are symmetrical throughout. Sensory, motor and coordinative functions appear intact. Skin:  Intact without suspicious lesions or rashes Cervical Nodes:  No lymphadenopathy noted Axillary Nodes:  No palpable lymphadenopathy Inguinal Nodes:  No significant adenopathy Psych:  Cognition and judgment appear intact. Alert and cooperative with normal attention span and concentration. No apparent delusions, illusions, hallucinations   Impression & Recommendations:  Problem # 1:  GERD (ICD-530.81) Assessment Improved  Her updated medication list for this problem includes:    Prilosec Otc 20 Mg Tbec (Omeprazole magnesium) ..... Once daily  Orders: Prescription Created Electronically [phone])  Problem # 2:  BREAST CANCER, HX OF (ICD-V10.3) Assessment: Unchanged  Orders: Prescription Created Electronically [phone])  Problem # 3:  HYPERTENSION (ICD-401.9) Assessment: Improved  Her updated medication list for this problem includes:    Hydrochlorothiazide 25 Mg Tabs (Hydrochlorothiazide) .Marland Kitchen... Take 1 tablet by mouth once a day    Zestril 5 Mg Tabs (Lisinopril) .Marland Kitchen... Take 1 tablet by mouth once a day  Orders: Venipuncture (52841) TLB-Lipid Panel (80061-LIPID) TLB-BMP (Basic Metabolic Panel-BMET)  (80048-METABOL) TLB-CBC Platelet - w/Differential (85025-CBCD) TLB-Hepatic/Liver Function Pnl (80076-HEPATIC) TLB-TSH (Thyroid Stimulating Hormone) (84443-TSH) UA Dipstick w/o Micro (automated)  (81003) Prescription Created Electronically [phone])  Problem # 4:  HYPERLIPIDEMIA (ICD-272.4) Assessment: Improved  Her updated medication list for this problem includes:    Zetia 10 Mg Tabs (Ezetimibe) ..... Every morning  Orders: Venipuncture (10272) TLB-Lipid Panel (80061-LIPID) Prescription Created Electronically [phone])  Problem # 5:  DYSFUNCTION, SLEEP STATE NEC (ICD-307.47) Assessment: Improved  Orders: Prescription Created Electronically [phone])  Complete Medication List: 1)  Hydrochlorothiazide 25 Mg Tabs (Hydrochlorothiazide) .... Take 1 tablet by mouth once a day 2)  Zestril 5 Mg Tabs (Lisinopril) .... Take 1 tablet by mouth once a day 3)  Prilosec Otc 20 Mg Tbec (Omeprazole magnesium) .... Once daily 4)  Seroquel 25 Mg Tabs (Quetiapine fumarate) .... Take 1 tablet by mouth at bedtime 5)  Remeron 15 Mg Tabs (Mirtazapine) .... Take 1/2 at bedtime 6)  Potassium Chloride 20 Meq Pack (Potassium chloride) .... 3 tabs once daily 7)  Calcium 500 500 Mg Tabs (Calcium carbonate) .... 2 daily 8)  Aspirin 81 Mg Tbec (Aspirin) .... Once daily 9)  Tamoxifen Citrate 10 Mg Tabs (Tamoxifen citrate) 10)  Lumigan 0.03 % Soln (Bimatoprost) .... At bedtime 11)  Zetia 10 Mg Tabs (Ezetimibe) .... Every morning 12)  Vitamin D3 1000 Unit Caps (Cholecalciferol) .... Take one tab once daily 13)  Cornbigan  14)  Ocuvite Preservision Tabs (Multiple vitamins-minerals) .... Take 2 tabs once daily  Other Orders: T-Vitamin D (25-Hydroxy) (30865-78469)  Patient Instructions: 1)  Please schedule a follow-up appointment in 1 year. 2)  It is important that you exercise regularly at least 20 minutes 5 times a week. If you develop chest pain, have severe difficulty breathing, or feel very tired , stop  exercising immediately and seek medical attention. Prescriptions: ZETIA 10 MG  TABS (EZETIMIBE) every morning  #100 x 3   Entered and Authorized by:   Roderick Pee MD   Signed by:   Roderick Pee MD on 04/20/2009   Method used:   Electronically to        [facility]* (mail-order)             ,          Ph: [phone]       Fax: [phone]   RxID:   [id number] POTASSIUM CHLORIDE 20 MEQ  PACK (POTASSIUM CHLORIDE) 3 tabs once daily  #300 x 3   Entered and Authorized by:   Roderick Pee MD   Signed by:   Roderick Pee MD on 04/20/2009   Method used:   Electronically to        [facility]* (mail-order)             ,          Ph: [phone]       Fax: [phone]   RxID:   [id number] REMERON 15 MG  TABS (MIRTAZAPINE) take 1/2 at bedtime  #100 x 2   Entered and Authorized by:   Roderick Pee MD   Signed by:   Roderick Pee MD on 04/20/2009   Method used:   Electronically to        [facility]* (mail-order)             ,          Ph: [phone]       Fax: [phone]   RxID:   [id number] SEROQUEL 25 MG  TABS (QUETIAPINE FUMARATE) Take 1 tablet by mouth at bedtime  #100 x 3   Entered and Authorized by:   Roderick Pee MD   Signed by:   Roderick Pee MD on 04/20/2009   Method used:   Electronically to        [facility]* (mail-order)             ,          Ph: [phone]       Fax: [phone]   RxID:   [id number] ZESTRIL 5 MG  TABS (LISINOPRIL) Take 1 tablet by mouth once a day  #100 x 3   Entered and Authorized by:   Roderick Pee MD   Signed by:   Roderick Pee MD on 04/20/2009   Method used:   Electronically to        [facility]* (mail-order)             ,          Ph: [phone]       Fax: [phone]   RxID:   [id number] HYDROCHLOROTHIAZIDE 25 MG  TABS (HYDROCHLOROTHIAZIDE) Take 1 tablet by mouth once a day  #100 x 3   Entered and Authorized by:   Roderick Pee MD   Signed by:   Roderick Pee MD on  04/20/2009  Method used:   Electronically to        [facility]* (mail-order)             ,          Ph: [phone]       Fax: [phone]   RxID:   [id number]   Laboratory Results   Urine Tests    Routine Urinalysis   Color: yellow Appearance: Hazy Glucose: negative   (Normal Range: Negative) Bilirubin: negative   (Normal Range: Negative) Ketone: negative   (Normal Range: Negative) Spec. Gravity: 1.020   (Normal Range: 1.003-1.035) Blood: negative   (Normal Range: Negative) pH: 5.5   (Normal Range: 5.0-8.0) Protein: trace   (Normal Range: Negative) Urobilinogen: 0.2   (Normal Range: 0-1) Nitrite: negative   (Normal Range: Negative) Leukocyte Esterace: 1+   (Normal Range: Negative)    Comments: Joanne Chars CMA  April 20, 2009 8:54 AM       Appended Document: pt will come in fasting/njr     Allergies: 1)  Aspirin (Aspirin) 2)  Codeine Sulfate (Codeine Sulfate)  Review of Systems       Flu Vaccine Consent Questions     Do you have a history of severe allergic reactions to this vaccine? no    Any prior history of allergic reactions to egg and/or gelatin? no    Do you have a sensitivity to the preservative Thimersol? no    Do you have a past history of Guillan-Barre Syndrome? no    Do you currently have an acute febrile illness? no    Have you ever had a severe reaction to latex? no    Vaccine information given and explained to patient? yes    Are you currently pregnant? no    Lot Number:[id number]   Exp Date:12/28/2009   Site Given  Left Deltoid IM    Complete Medication List: 1)  Hydrochlorothiazide 25 Mg Tabs (Hydrochlorothiazide) .... Take 1 tablet by mouth once a day 2)  Zestril 5 Mg Tabs (Lisinopril) .... Take 1 tablet by mouth once a day 3)  Prilosec Otc 20 Mg Tbec (Omeprazole magnesium) .... Once daily 4)  Seroquel 25 Mg Tabs (Quetiapine fumarate) .... Take 1 tablet by mouth at bedtime 5)  Remeron 15 Mg Tabs (Mirtazapine) ....  Take 1/2 at bedtime 6)  Potassium Chloride 20 Meq Pack (Potassium chloride) .... 3 tabs once daily 7)  Calcium 500 500 Mg Tabs (Calcium carbonate) .... 2 daily 8)  Aspirin 81 Mg Tbec (Aspirin) .... Once daily 9)  Tamoxifen Citrate 10 Mg Tabs (Tamoxifen citrate) 10)  Lumigan 0.03 % Soln (Bimatoprost) .... At bedtime 11)  Zetia 10 Mg Tabs (Ezetimibe) .... Every morning 12)  Vitamin D3 1000 Unit Caps (Cholecalciferol) .... Take one tab once daily 13)  Cornbigan  14)  Ocuvite Preservision Tabs (Multiple vitamins-minerals) .... Take 2 tabs once daily  Other Orders: Flu Vaccine 49yrs + (01027) Administration Flu vaccine - MCR (G0008) EKG w/ Interpretation (93000)

## 2010-08-02 NOTE — Progress Notes (Signed)
Summary: mirtazapine refill  Phone Note Refill Request Message from:  Fax from Pharmacy  Refills Requested: Medication #1:  REMERON 15 MG  TABS take 1/2 at bedtime Initial call taken by: Kern Reap CMA (AAMA),  February 08, 2009 12:00 PM    Prescriptions: REMERON 15 MG  TABS (MIRTAZAPINE) take 1/2 at bedtime  #100 x 0   Entered by:   Kern Reap CMA (AAMA)   Authorized by:   Roderick Pee MD   Signed by:   Kern Reap CMA (AAMA) on 02/08/2009   Method used:   Electronically to        [facility]* (retail)       [address] [place], [place]  [address]       Ph: [phone] or [phone]       Fax: [phone]   RxID:   [id number]

## 2010-08-02 NOTE — Assessment & Plan Note (Signed)
Summary: emp/will fast/surgical clearance/ccm   Vital Signs:  Patient Profile:   76 Years Old Female Height:     63 inches (158.12 cm) Weight:      168 pounds Temp:     98.3 degrees F oral Pulse rate:   80 / minute Pulse rhythm:   regular BP sitting:   102 / 74  (left arm) Cuff size:   regular  Vitals Entered By: Kern Reap CMA (April 12, 2008 10:04 AM)                 Chief Complaint:  cpx.  History of Present Illness: Jacqueline [name] is a 76 year old female, married, nonsmoker, who comes in today for annual physical examination and preop clearance for total right knee replacement.  She had her left knee replaced by Dr. Thurston Hole and had no complications.  She now is to in 3 1/2 weeks to have her right knee replaced.  She has had a history of breast cancer.  She had surgery and now is on tamoxifen.  The oncology and doing well.  Her oncologist is Dr. Caron Presume.  Her hypertension is treated with HCTZ 25 mg daily, Zestril, 5 mg daily, she also takes a potassium supplement 20 mEq 3 tabs daily.  BP 102/74 today.  She also takes over-the-counter Prilosec 20 mg for reflux esophagitis.  For sleep dysfunction.  She takes Seroquel 25 mg at bedtime and Remeron 15 mg one half tablet at bedtime.  She also takes calcium, vitamin D, and 81 mg, baby aspirin.  She also takes Zetia 10 mg daily for hyperlipidemia.  She is up on all vaccinations. she has been taking Fosamax, however, I asked her to hold this because of the current controversy over long bone and jaw problems    Current Allergies (reviewed today): ASPIRIN (ASPIRIN) CODEINE SULFATE (CODEINE SULFATE)  Past Medical History:    Reviewed history from 04/08/2007 and no changes required:       Hyperlipidemia       Hypertension       Osteopenia       Osteoporosis       sleep dysfunction       Breast cancer, hx of 2007       total left knee replacement       GERD   Social History:    Reviewed history and no changes required:      Married       Never Smoked       Alcohol use-no       Drug use-no       Regular exercise-yes   Risk Factors:  Tobacco use:  never Drug use:  no Alcohol use:  no Exercise:  yes   Review of Systems      See HPI   Physical Exam  General:     Well-developed,well-nourished,in no acute distress; alert,appropriate and cooperative throughout examination Head:     Normocephalic and atraumatic without obvious abnormalities. No apparent alopecia or balding. Eyes:     No corneal or conjunctival inflammation noted. EOMI. Perrla. Funduscopic exam benign, without hemorrhages, exudates or papilledema. Vision grossly normal. Ears:     External ear exam shows no significant lesions or deformities.  Otoscopic examination reveals clear canals, tympanic membranes are intact bilaterally without bulging, retraction, inflammation or discharge. Hearing is grossly normal bilaterally. Nose:     External nasal examination shows no deformity or inflammation. Nasal mucosa are pink and moist without lesions or exudates. Mouth:  Oral mucosa and oropharynx without lesions or exudates.  Teeth in good repair. Neck:     No deformities, masses, or tenderness noted. Chest Wall:     No deformities, masses, or tenderness noted. Breasts:     right breast normal.  This scar left breast it the two o'clock position from previous lumpectomy.  Also scar in the axillary area from axillary node biopsy. Lungs:     Normal respiratory effort, chest expands symmetrically. Lungs are clear to auscultation, no crackles or wheezes. Heart:     Normal rate and regular rhythm. S1 and S2 normal without gallop, murmur, click, rub or other extra sounds. Abdomen:     Bowel sounds positive,abdomen soft and non-tender without masses, organomegaly or hernias noted. Rectal:     No external abnormalities noted. Normal sphincter tone. No rectal masses or tenderness. Genitalia:     Pelvic Exam:        External: normal female  genitalia without lesions or masses        Vagina: normal without lesions or masses        Cervix: normal without lesions or masses        Adnexa: normal bimanual exam without masses or fullness        Uterus: normal by palpation        Pap smear: performed Msk:     No deformity or scoliosis noted of thoracic or lumbar spine.   Pulses:     R and L carotid,radial,femoral,dorsalis pedis and posterior tibial pulses are full and equal bilaterally Extremities:     No clubbing, cyanosis, edema, or deformity noted with normal full range of motion of all joints.   Neurologic:     No cranial nerve deficits noted. Station and gait are normal. Plantar reflexes are down-going bilaterally. DTRs are symmetrical throughout. Sensory, motor and coordinative functions appear intact. Skin:     Intact without suspicious lesions or rashes Cervical Nodes:     No lymphadenopathy noted Axillary Nodes:     No palpable lymphadenopathy Inguinal Nodes:     No significant adenopathy Psych:     Cognition and judgment appear intact. Alert and cooperative with normal attention span and concentration. No apparent delusions, illusions, hallucinations    Impression & Recommendations:  Problem # 1:  GERD (ICD-530.81) Assessment: Improved  Her updated medication list for this problem includes:    Prilosec Otc 20 Mg Tbec (Omeprazole magnesium) ..... Once daily  Orders: Venipuncture (91478) TLB-Lipid Panel (80061-LIPID) TLB-TSH (Thyroid Stimulating Hormone) (84443-TSH)   Problem # 2:  BREAST CANCER, HX OF (ICD-V10.3) Assessment: Improved  Orders: Venipuncture (29562) TLB-Lipid Panel (80061-LIPID) TLB-TSH (Thyroid Stimulating Hormone) (84443-TSH)   Problem # 3:  DYSFUNCTION, SLEEP STATE NEC (ICD-307.47) Assessment: Improved  Problem # 4:  HYPERTENSION (ICD-401.9) Assessment: Improved  Her updated medication list for this problem includes:    Hydrochlorothiazide 25 Mg Tabs (Hydrochlorothiazide) .Marland Kitchen...  Take 1 tablet by mouth once a day    Zestril 5 Mg Tabs (Lisinopril) .Marland Kitchen... Take 1 tablet by mouth once a day  Orders: Venipuncture (13086) TLB-Lipid Panel (80061-LIPID) TLB-TSH (Thyroid Stimulating Hormone) (84443-TSH)   Problem # 5:  HYPERLIPIDEMIA (ICD-272.4) Assessment: Improved  Her updated medication list for this problem includes:    Zetia 10 Mg Tabs (Ezetimibe) ..... Every morning  Orders: Venipuncture (57846) TLB-Lipid Panel (80061-LIPID) TLB-TSH (Thyroid Stimulating Hormone) (84443-TSH)   Complete Medication List: 1)  Hydrochlorothiazide 25 Mg Tabs (Hydrochlorothiazide) .... Take 1 tablet by mouth once a day 2)  Zestril  5 Mg Tabs (Lisinopril) .... Take 1 tablet by mouth once a day 3)  Prilosec Otc 20 Mg Tbec (Omeprazole magnesium) .... Once daily 4)  Fosamax 35 Mg Tabs (Alendronate sodium) .... Weekly 5)  Seroquel 25 Mg Tabs (Quetiapine fumarate) .... Take 1 tablet by mouth at bedtime 6)  Remeron 15 Mg Tabs (Mirtazapine) .... Take 1/2 at bedtime 7)  Potassium Chloride 20 Meq Pack (Potassium chloride) .... 3 tabs once daily 8)  Calcium 500 500 Mg Tabs (Calcium carbonate) .... 2 daily 9)  Aspirin 81 Mg Tbec (Aspirin) .... Once daily 10)  Tamoxifen Citrate 10 Mg Tabs (Tamoxifen citrate) 11)  Lumigan 0.03 % Soln (Bimatoprost) .... At bedtime 12)  Zetia 10 Mg Tabs (Ezetimibe) .... Every morning 13)  Vitamin D3 1000 Unit Caps (Cholecalciferol) .... Take one tab once daily 14)  Cornbigan    Patient Instructions: 1)  Please schedule a follow-up appointment in 1 year. 2)  It is important that you exercise regularly at least 20 minutes 5 times a week. If you develop chest pain, have severe difficulty breathing, or feel very tired , stop exercising immediately and seek medical attention. 3)  Schedule your mammogram. 4)  Take calcium +Vitamin D daily. 5)  Take an Aspirin every day. 6)  stop the Fosamax until we get more information about the potential long-term side effects.   Stop the aspirin a week prior to surgery 7)  Via  this note, you have surgical clearance to get a total right knee replacement   Prescriptions: ZETIA 10 MG  TABS (EZETIMIBE) every morning  #100 x 3   Entered and Authorized by:   Roderick Pee MD   Signed by:   Roderick Pee MD on 04/12/2008   Method used:   Print then Give to Patient   RxID:   [id number] POTASSIUM CHLORIDE 20 MEQ  PACK (POTASSIUM CHLORIDE) 3 tabs once daily  #300 x 3   Entered and Authorized by:   Roderick Pee MD   Signed by:   Roderick Pee MD on 04/12/2008   Method used:   Print then Give to Patient   RxID:   [id number] REMERON 15 MG  TABS (MIRTAZAPINE) take 1/2 at bedtime  #100 x 3   Entered and Authorized by:   Roderick Pee MD   Signed by:   Roderick Pee MD on 04/12/2008   Method used:   Print then Give to Patient   RxID:   [id number] SEROQUEL 25 MG  TABS (QUETIAPINE FUMARATE) Take 1 tablet by mouth at bedtime  #100 x 3   Entered and Authorized by:   Roderick Pee MD   Signed by:   Roderick Pee MD on 04/12/2008   Method used:   Print then Give to Patient   RxID:   [id number] PRILOSEC OTC 20 MG  TBEC (OMEPRAZOLE MAGNESIUM) once daily  #100 x 3   Entered and Authorized by:   Roderick Pee MD   Signed by:   Roderick Pee MD on 04/12/2008   Method used:   Print then Give to Patient   RxID:   [id number] ZESTRIL 5 MG  TABS (LISINOPRIL) Take 1 tablet by mouth once a day  #100 x 3   Entered and Authorized by:   Roderick Pee MD   Signed by:   Roderick Pee MD on 04/12/2008   Method used:   Print then Give to Patient   RxID:  [id number] HYDROCHLOROTHIAZIDE 25 MG  TABS (HYDROCHLOROTHIAZIDE) Take 1 tablet by mouth once a day  #100 x 3   Entered and Authorized by:   Roderick Pee MD   Signed by:   Roderick Pee MD on 04/12/2008   Method used:   Print then Give to Patient   RxID:   [id number] ZETIA 10 MG  TABS (EZETIMIBE) every morning  #100 x 3   Entered  and Authorized by:   Roderick Pee MD   Signed by:   Roderick Pee MD on 04/12/2008   Method used:   Electronically to        [facility]  [place] [address] (retail)       [address]       [place], [place]  [address]       Ph: [phone] or [phone]       Fax: [phone]   RxID:   [id number] POTASSIUM CHLORIDE 20 MEQ  PACK (POTASSIUM CHLORIDE) 3 tabs once daily  #300 x 3   Entered and Authorized by:   Roderick Pee MD   Signed by:   Roderick Pee MD on 04/12/2008   Method used:   Electronically to        [facility]  [place] [address] (retail)       [address]       [place], [place]  [address]       Ph: [phone] or [phone]       Fax: [phone]   RxID:   [id number] REMERON 15 MG  TABS (MIRTAZAPINE) take 1/2 at bedtime  #100 x 3   Entered and Authorized by:   Roderick Pee MD   Signed by:   Roderick Pee MD on 04/12/2008   Method used:   Electronically to        [facility]  [place] [address] (retail)       [address]       [place], [place]  [address]       Ph: [phone] or [phone]       Fax: [phone]   RxID:   [id number] SEROQUEL 25 MG  TABS (QUETIAPINE FUMARATE) Take 1 tablet by mouth at bedtime  #100 x 3   Entered and Authorized by:   Roderick Pee MD   Signed by:   Roderick Pee MD on 04/12/2008   Method used:   [facility]ronically to        [facility]  [place] [address] (retail)       [address]       [place], [place]  [address]       Ph: [phone] or [phone]       Fax: [phone]   RxID:   [id number] PRILOSEC OTC 20 MG  TBEC (OMEPRAZOLE MAGNESIUM) once daily  #100 x 3   Entered and Authorized by:   Roderick Pee MD   Signed by:   Roderick Pee MD on 04/12/2008   Method used:   [facility]ically to        [facility]  [place] [address] (retail[facility]       [address]       [place], [place]  [address]       Ph: [phone] or [phone]       Fax: [phone]   RxID:   [id number] ZESTRIL 5 MG  TABS (LISINOPRIL)  Take 1 tablet  by mouth once a day  #100 x 3   Entered and Authorized by:   Roderick Pee MD   Signed by:   Roderick Pee MD on 04/12/2008   Method used:   [facility]ronically to        [facility]  [place] [address] (retail)       [address]       [place], [place]  [address]       Ph: [phone] or [phone]       Fax: [phone]   RxID:   [id number] HYDROCHLOROTHIAZIDE 25 MG  TABS (HYDROCHLOROTHIAZIDE) Take 1 tablet by mouth once a day  #100 x 3   Entered and Authorized by:   Roderick Pee MD   Signed by:   Roderick Pee MD on 04/12/2008   Method used:   [facility]        [facility]  [place] [address] ([facility]ail)       [address]       [place], [place]  [address]       Ph: [phone] or [phone]       Fax: [phone]   RxID:   [id number]  ]

## 2010-08-02 NOTE — Assessment & Plan Note (Signed)
Summary: 30 min ov for another concerns/njr   Vital Signs:  Patient profile:   76 year old female Menstrual status:  postmenopausal Temp:     98.2 degrees F oral BP sitting:   102 / 72  (left arm) Cuff size:   regular  Vitals Entered By: Kern Reap CMA Duncan Dull) (January 08, 2010 12:16 PM) CC: uti, tired   CC:  uti and tired.  History of Present Illness: Jacqueline [name] is a 76 year old, married female, nonsmoker, who comes in today with a 3 week history of fatigue and lightheadedness.  She also has a 4-day history of dysuria and frequency.  She is due to see Dr. Tenny Craw tomorrow at [facility] for evaluation of postmenopausal vaginal bleeding.  For the past 3 weeks.  She, states she's felt lightheaded.  She does not describe vertigo.  She's had no fever, earache, sore throat, cough, nausea, vomiting, diarrhea, etc..  Four days ago, she began having urinary tract symptoms with burning and frequency.  Urinalysis abnormal with 3+ blood.  The 2+ white cells.  We will start her on an antibiotic today for that.  Review of systems otherwise negative.  No new medication.  BP 102/72.  She states she is not lightheaded when she stands up, however, I explained to her that 102/72, was too low for her age.  Also, her daughter has a history of hypothyroidism.    Allergies: 1)  Aspirin (Aspirin) 2)  Codeine Sulfate (Codeine Sulfate)  Past History:  Past medical, surgical, family and social histories (including risk factors) reviewed, and no changes noted (except as noted below).  Past Medical History: Reviewed history from 04/12/2008 and no changes required. Hyperlipidemia Hypertension Osteopenia Osteoporosis sleep dysfunction Breast cancer, hx of 2007 total left knee replacement GERD  Past Surgical History: Reviewed history from 04/20/2009 and no changes required. arthroscopy R knee 2003 Total knee replacement L  2005 Cataract extraction  09 Total knee replacement  09    Lumpectomy...by mouth rad.  07  Family History: Reviewed history and no changes required.  Social History: Reviewed history from 04/12/2008 and no changes required. Married Never Smoked Alcohol use-no Drug use-no Regular exercise-yes  Review of Systems      See HPI  Physical Exam  General:  Well-developed,well-nourished,in no acute distress; alert,appropriate and cooperative throughout examination Head:  Normocephalic and atraumatic without obvious abnormalities. No apparent alopecia or balding. Eyes:  No corneal or conjunctival inflammation noted. EOMI. Perrla. Funduscopic exam benign, without hemorrhages, exudates or papilledema. Vision grossly normal. Ears:  External ear exam shows no significant lesions or deformities.  Otoscopic examination reveals clear canals, tympanic membranes are intact bilaterally without bulging, retraction, inflammation or discharge. Hearing is grossly normal bilaterally. Nose:  External nasal examination shows no deformity or inflammation. Nasal mucosa are pink and moist without lesions or exudates. Mouth:  Oral mucosa and oropharynx without lesions or exudates.  Teeth in good repair. Neck:  No deformities, masses, or tenderness noted. Chest Wall:  No deformities, masses, or tenderness noted. Lungs:  Normal respiratory effort, chest expands symmetrically. Lungs are clear to auscultation, no crackles or wheezes. Heart:  Normal rate and regular rhythm. S1 and S2 normal without gallop, murmur, click, rub or other extra sounds. Abdomen:  Bowel sounds positive,abdomen soft and non-tender without masses, organomegaly or hernias noted. Msk:  No deformity or scoliosis noted of thoracic or lumbar spine.   Pulses:  R and L carotid,radial,femoral,dorsalis pedis and posterior tibial pulses are full and equal bilaterally Extremities:  No clubbing, cyanosis, edema, or deformity noted with normal full range of motion of all joints.   Neurologic:  No cranial nerve  deficits noted. Station and gait are normal. Plantar reflexes are down-going bilaterally. DTRs are symmetrical throughout. Sensory, motor and coordinative functions appear intact. Skin:  Intact without suspicious lesions or rashes Psych:  Cognition and judgment appear intact. Alert and cooperative with normal attention span and concentration. No apparent delusions, illusions, hallucinations   Problems:  Medical Problems Added: 1)  Dx of Fatigue  (ICD-780.79)  Impression & Recommendations:  Problem # 1:  POSTMENOPAUSAL BLEEDING (ICD-627.1) Assessment Unchanged  Orders: Venipuncture (54098) TLB-BMP (Basic Metabolic Panel-BMET) (80048-METABOL) TLB-CBC Platelet - w/Differential (85025-CBCD) TLB-Hepatic/Liver Function Pnl (80076-HEPATIC) TLB-TSH (Thyroid Stimulating Hormone) (84443-TSH) TLB-T3, Free (Triiodothyronine) (84481-T3FREE) TLB-T4 (Thyrox), Free [phone]) Prescription Created Electronically [phone])  Problem # 2:  ACUTE CYSTITIS (ICD-595.0) Assessment: Deteriorated  Orders: UA Dipstick w/o Micro (manual) (08657)  Her updated medication list for this problem includes:    Septra Ds 800-160 Mg Tabs (Sulfamethoxazole-trimethoprim) .Marland Kitchen... Take 1 tablet by mouth two times a day  Problem # 3:  FATIGUE (ICD-780.79) Assessment: New  Orders: Venipuncture (84696) TLB-BMP (Basic Metabolic Panel-BMET) (80048-METABOL) TLB-CBC Platelet - w/Differential (85025-CBCD) TLB-Hepatic/Liver Function Pnl (80076-HEPATIC) TLB-TSH (Thyroid Stimulating Hormone) (84443-TSH) TLB-T3, Free (Triiodothyronine) (84481-T3FREE) TLB-T4 (Thyrox), Free [phone]) Prescription Created Electronically [phone])  Complete Medication List: 1)  Hydrochlorothiazide 25 Mg Tabs (Hydrochlorothiazide) .... Take 1 tablet by mouth once a day 2)  Zestril 5 Mg Tabs (Lisinopril) .... Take 1 tablet by mouth once a day 3)  Prilosec Otc 20 Mg Tbec (Omeprazole magnesium) .... Once daily 4)  Seroquel 25 Mg Tabs  (Quetiapine fumarate) .... Take 1 tablet by mouth at bedtime 5)  Remeron 15 Mg Tabs (Mirtazapine) .... Take 1/2 at bedtime 6)  Calcium 500 500 Mg Tabs (Calcium carbonate) .... 2 daily 7)  Aspirin 81 Mg Tbec (Aspirin) .... Once daily 8)  Tamoxifen Citrate 10 Mg Tabs (Tamoxifen citrate) 9)  Lumigan 0.03 % Soln (Bimatoprost) .... At bedtime 10)  Zetia 10 Mg Tabs (Ezetimibe) .... Every morning 11)  Vitamin D3 1000 Unit Caps (Cholecalciferol) .... Take one tab once daily 12)  Cornbigan  13)  Ocuvite Preservision Tabs (Multiple vitamins-minerals) .... Take 2 tabs once daily 14)  Potassium Chloride Crys Cr 20 Meq Cr-tabs (Potassium chloride crys cr) .... Take one tab three times a day 15)  Septra Ds 800-160 Mg Tabs (Sulfamethoxazole-trimethoprim) .... Take 1 tablet by mouth two times a day  Patient Instructions: 1)  begin Septra DS one twice a day for 10 days for the UTI.  Also pyridium t.i.d. p.r.n. 2)  I will call you when  I get your lab work back tomorrow. 3)  I will forward a copy of today's visit to Dr. Tenny Craw who you will be seen tomorrow 4)  also stop the Zestril.  Check y  blood pressure daily in the morning.  Fax me the data in 3 weeks at [phone] Prescriptions: SEPTRA DS 800-160 MG TABS (SULFAMETHOXAZOLE-TRIMETHOPRIM) Take 1 tablet by mouth two times a day  #20 x 1   Entered and Authorized by:   Roderick Pee MD   Signed by:   Roderick Pee MD on 01/08/2010   Method used:   Electronically to        [facility]* (retail)       [address] [place], [place]  [address]       Ph: [phone]  or [phone]       Fax: [phone]   RxID:   [id number]   Laboratory Results   Urine Tests  Date/Time Received: January 08, 2010   Routine Urinalysis   Color: red Appearance: Hazy Glucose: negative   (Normal Range: Negative) Bilirubin: negative   (Normal Range: Negative) Ketone: negative   (Normal Range: Negative) Spec. Gravity: 1.025   (Normal Range:  1.003-1.035) Blood: large   (Normal Range: Negative) pH: 6.0   (Normal Range: 5.0-8.0) Protein: 100   (Normal Range: Negative) Urobilinogen: 0.2   (Normal Range: 0-1) Nitrite: negative   (Normal Range: Negative) Leukocyte Esterace: moderate   (Normal Range: Negative)    Comments: Kern Reap CMA (AAMA)  January 08, 2010 12:30 PM

## 2010-08-02 NOTE — Progress Notes (Signed)
Summary: potassium  Phone Note Call from Patient   Summary of Call: patient is aware of her lab work and would like to know if she should continue taking 3 potassium pills daily Initial call taken by: Kern Reap CMA Duncan Dull),  April 21, 2009 3:43 PM  Follow-up for Phone Call        yes.  Follow-up by: Roderick Pee MD,  April 21, 2009 4:00 PM  Additional Follow-up for Phone Call Additional follow up Details #1::        Phone Call Completed Additional Follow-up by: Kern Reap CMA Duncan Dull),  April 21, 2009 5:07 PM

## 2010-08-02 NOTE — Assessment & Plan Note (Signed)
Summary: ? bladder inf-ok per rachel//ccm   Vital Signs:  Patient profile:   76 year old female Menstrual status:  postmenopausal Weight:      168 pounds Temp:     98.8 degrees F BP sitting:   112 / 78  (left arm) Cuff size:   regular  Vitals Entered By: Kern Reap CMA Duncan Dull) (August 10, 2009 2:42 PM) CC: UTI   CC:  UTI.  History of Present Illness: Jacqueline [name] is a 76 year old, married female, nonsmoker, who comes in today with a one-day history of dysuria.  No fever, chills, or back pain.  Last UTI couple years ago.  Allergies: 1)  Aspirin (Aspirin) 2)  Codeine Sulfate (Codeine Sulfate)  Past History:  Past medical, surgical, family and social histories (including risk factors) reviewed for relevance to current acute and chronic problems.  Past Medical History: Reviewed history from 04/12/2008 and no changes required. Hyperlipidemia Hypertension Osteopenia Osteoporosis sleep dysfunction Breast cancer, hx of 2007 total left knee replacement GERD  Past Surgical History: Reviewed history from 04/20/2009 and no changes required. arthroscopy R knee 2003 Total knee replacement L  2005 Cataract extraction  09 Total knee replacement  09  Lumpectomy...by mouth rad.  07  Family History: Reviewed history and no changes required.  Social History: Reviewed history from 04/12/2008 and no changes required. Married Never Smoked Alcohol use-no Drug use-no Regular exercise-yes  Review of Systems      See HPI  Physical Exam  General:  Well-developed,well-nourished,in no acute distress; alert,appropriate and cooperative throughout examination Abdomen:  Bowel sounds positive,abdomen soft and non-tender without masses, organomegaly or hernias noted.   Problems:  Medical Problems Added: 1)  Dx of Uti  (ICD-599.0) 2)  Dx of Acute Cystitis  (ICD-595.0)  Impression & Recommendations:  Problem # 1:  UTI (ICD-599.0) Assessment New  Orders: Prescription Created  Electronically [phone])  Her updated medication list for this problem includes:    Septra Ds 800-160 Mg Tabs (Sulfamethoxazole-trimethoprim) .Marland Kitchen... Take 1 tablet by mouth two times a day  Complete Medication List: 1)  Hydrochlorothiazide 25 Mg Tabs (Hydrochlorothiazide) .... Take 1 tablet by mouth once a day 2)  Zestril 5 Mg Tabs (Lisinopril) .... Take 1 tablet by mouth once a day 3)  Prilosec Otc 20 Mg Tbec (Omeprazole magnesium) .... Once daily 4)  Seroquel 25 Mg Tabs (Quetiapine fumarate) .... Take 1 tablet by mouth at bedtime 5)  Remeron 15 Mg Tabs (Mirtazapine) .... Take 1/2 at bedtime 6)  Calcium 500 500 Mg Tabs (Calcium carbonate) .... 2 daily 7)  Aspirin 81 Mg Tbec (Aspirin) .... Once daily 8)  Tamoxifen Citrate 10 Mg Tabs (Tamoxifen citrate) 9)  Lumigan 0.03 % Soln (Bimatoprost) .... At bedtime 10)  Zetia 10 Mg Tabs (Ezetimibe) .... Every morning 11)  Vitamin D3 1000 Unit Caps (Cholecalciferol) .... Take one tab once daily 12)  Cornbigan  13)  Ocuvite Preservision Tabs (Multiple vitamins-minerals) .... Take 2 tabs once daily 14)  Potassium Chloride Crys Cr 20 Meq Cr-tabs (Potassium chloride crys cr) .... Take one tab three times a day 15)  Septra Ds 800-160 Mg Tabs (Sulfamethoxazole-trimethoprim) .... Take 1 tablet by mouth two times a day  Other Orders: UA Dipstick w/o Micro (manual) (60454)  Patient Instructions: 1)  begin Septra DS, one twice a day, x 10 days.  Return p.r.n. Prescriptions: SEPTRA DS 800-160 MG TABS (SULFAMETHOXAZOLE-TRIMETHOPRIM) Take 1 tablet by mouth two times a day  #20 x 1   Entered and Authorized by:  Roderick Pee MD   Signed by:   Roderick Pee MD on 08/10/2009   Method used:   Electronically to        [facility]* (retail)       [address] [place], [place]  [address]       Ph: [phone] or [phone]       Fax: [phone]   RxID:   [id number]   Laboratory Results   Urine Tests  Date/Time Received: August 10, 2009   Routine Urinalysis   Color: yellow Appearance: Cloudy Glucose: negative   (Normal Range: Negative) Bilirubin: negative   (Normal Range: Negative) Ketone: negative   (Normal Range: Negative) Spec. Gravity: 1.015   (Normal Range: 1.003-1.035) Blood: large   (Normal Range: Negative) pH: 6.0   (Normal Range: 5.0-8.0) Protein: trace   (Normal Range: Negative) Urobilinogen: 0.2   (Normal Range: 0-1) Nitrite: negative   (Normal Range: Negative) Leukocyte Esterace: small   (Normal Range: Negative)    Comments: Kern Reap CMA Duncan Dull)  August 10, 2009 4:37 PM

## 2010-08-02 NOTE — Assessment & Plan Note (Signed)
Summary: flu shot/mhf/resch with patient/jls  Nurse Visit  Flu Vaccine Consent Questions     Do you have a history of severe allergic reactions to this vaccine? no    Any prior history of allergic reactions to egg and/or gelatin? no    Do you have a sensitivity to the preservative Thimersol? no    Do you have a past history of Guillan-Barre Syndrome? no    Do you currently have an acute febrile illness? no    Have you ever had a severe reaction to latex? no    Vaccine information given and explained to patient? yes    Are you currently pregnant? no    Lot Number:[id number]   Site Given  Left Deltoid IM   Prior Medications: HYDROCHLOROTHIAZIDE 25 MG  TABS (HYDROCHLOROTHIAZIDE) Take 1 tablet by mouth once a day ZESTRIL 5 MG  TABS (LISINOPRIL) Take 1 tablet by mouth once a day PRILOSEC OTC 20 MG  TBEC (OMEPRAZOLE MAGNESIUM) once daily FOSAMAX 35 MG  TABS (ALENDRONATE SODIUM) weekly SEROQUEL 25 MG  TABS (QUETIAPINE FUMARATE) Take 1 tablet by mouth at bedtime REMERON 15 MG  TABS (MIRTAZAPINE) take 1/2 at bedtime POTASSIUM CHLORIDE 20 MEQ  PACK (POTASSIUM CHLORIDE) 3 tabs once daily CALCIUM 500 500 MG  TABS (CALCIUM CARBONATE) 2 daily VITAMIN D 400 UNIT  CAPS (CHOLECALCIFEROL) once daily ASPIRIN 81 MG  TBEC (ASPIRIN) once daily TAMOXIFEN CITRATE 10 MG  TABS (TAMOXIFEN CITRATE)  LUMIGAN 0.03 %  SOLN (BIMATOPROST) at bedtime ZETIA 10 MG  TABS (EZETIMIBE) every morning Current Allergies: ASPIRIN (ASPIRIN) CODEINE SULFATE (CODEINE SULFATE)    Orders Added: 1)  Flu Vaccine 93yrs + [[address]] 2)  Administration Flu vaccine [G0008]    ]

## 2010-08-02 NOTE — Assessment & Plan Note (Signed)
Summary: CPX (PT WILL COME IN FASTING) // RS   Vital Signs:  Patient profile:   76 year old female Menstrual status:  postmenopausal Height:      63 inches Weight:      168 pounds BMI:     29.87 Temp:     98.4 degrees F oral BP sitting:   102 / 70  (left arm) Cuff size:   regular  Vitals Entered By: Kern Reap CMA Duncan Dull) (April 23, 2010 9:33 AM) CC: wellness exam Is Patient Diabetic? No Pain Assessment Patient in pain? no        CC:  wellness exam.  History of Present Illness: Jacqueline [name] is a 76 year old, married female, nonsmoker, who comes in today for Medicare wellness exam.  Because of a history of a left breast cancer, migraine headaches, hyperlipidemia, hypertension, postmenopausal vaginal bleeding, and reflux esophagitis.  She said surgery and radiation left breast doing well.  She is on tamoxifen 10 mg daily.  She also takes Prilosec 20 mg q.a.m. for reflux esophagitis.  She takes Seroquel 25 mg nightly along with Remeron 15 mg nightly for migraine headaches.  She is beginning to taper her medication.  No recurrence of migraines.  She takes the 1 mg a baby aspirin daily, however, she's have been bruising of her arms.  Advise to take the aspirin Monday, Wednesday, Friday.  She takes eye drops, and has eye exams every 4 months via her ophthalmologist for some mild glaucoma.  She takes Zetia 10 mg daily for hyperlipidemia, calcium and vitamin D.  She also takes Mevacor, thiazide, and 3, potassium tablets a day for hypertension, however, will switch to Maxzide, so, we can get off the potassium.  She gets routine eye care, dental care, BSE monthly, annual mammography, colonoscopy showed a polyp.  She is due for recall.  Tetanus 2004, Pneumovax 2008, shingles 2005, seasonal flu shot today.  Last summer.  She had some vaginal bleeding.  D&C showed a polyp.  It was removed by GYN.  No recurrence.  Here for Medicare AWV:  1.   Risk factors based on Past M, S, F history:reviewed.   No change except for noted above 2.   Physical Activities: walks daily 3.   Depression/mood: good mood.  No depression 4.   Hearing: normal 5.   ADL's: functions independently 6.   Fall Risk: we did not identify 7.   Home Safety: reviewed.  No guns in the house 8.   Height, weight, &visual acuity:height weight stable.  Exam.  Q.4 months 9.   Counseling: continue exercise taper medicine for migraines 10.   Labs ordered based on risk factors: done today 11.           Referral Coordination...none  12.           Care Plan 13.            Cognitive Assessment   Allergies: 1)  Aspirin (Aspirin) 2)  Codeine Sulfate (Codeine Sulfate)  Past History:  Past medical, surgical, family and social histories (including risk factors) reviewed, and no changes noted (except as noted below).  Past Medical History: Reviewed history from 04/12/2008 and no changes required. Hyperlipidemia Hypertension Osteopenia Osteoporosis sleep dysfunction Breast cancer, hx of 2007 total left knee replacement GERD  Past Surgical History: Reviewed history from 04/20/2009 and no changes required. arthroscopy R knee 2003 Total knee replacement L  2005 Cataract extraction  09 Total knee replacement  09  Lumpectomy...by mouth rad.  07  Family History: Reviewed history and no changes required.  Social History: Reviewed history from 04/12/2008 and no changes required. Married Never Smoked Alcohol use-no Drug use-no Regular exercise-yes  Review of Systems      See HPI       Flu Vaccine Consent Questions     Do you have a history of severe allergic reactions to this vaccine? no    Any prior history of allergic reactions to egg and/or gelatin? no    Do you have a sensitivity to the preservative Thimersol? no    Do you have a past history of Guillan-Barre Syndrome? no    Do you currently have an acute febrile illness? no    Have you ever had a severe reaction to latex? no    Vaccine information given  and explained to patient? yes    Are you currently pregnant? no    Lot Number:[id number]   Exp Date:12/29/2010   Site Given  Left Deltoid IM   Physical Exam  General:  Well-developed,well-nourished,in no acute distress; alert,appropriate and cooperative throughout examination Head:  Normocephalic and atraumatic without obvious abnormalities. No apparent alopecia or balding. Eyes:  No corneal or conjunctival inflammation noted. EOMI. Perrla. Funduscopic exam benign, without hemorrhages, exudates or papilledema. Vision grossly normal. Ears:  External ear exam shows no significant lesions or deformities.  Otoscopic examination reveals clear canals, tympanic membranes are intact bilaterally without bulging, retraction, inflammation or discharge. Hearing is grossly normal bilaterally. Nose:  External nasal examination shows no deformity or inflammation. Nasal mucosa are pink and moist without lesions or exudates. Mouth:  Oral mucosa and oropharynx without lesions or exudates.  Teeth in good repair. Neck:  No deformities, masses, or tenderness noted. Chest Wall:  No deformities, masses, or tenderness noted. Breasts:  right and left breasts both normal.  Scar left breast from previous surgery Lungs:  Normal respiratory effort, chest expands symmetrically. Lungs are clear to auscultation, no crackles or wheezes. Heart:  Normal rate and regular rhythm. S1 and S2 normal without gallop, murmur, click, rub or other extra sounds. Abdomen:  Bowel sounds positive,abdomen soft and non-tender without masses, organomegaly or hernias noted. Rectal:  No external abnormalities noted. Normal sphincter tone. No rectal masses or tenderness. Genitalia:  Pelvic Exam:        External: normal female genitalia without lesions or masses.......Marland Kitchen4+ erythema        Vagina: normal without lesions or masses        Cervix: normal without lesions or masses        Adnexa: normal bimanual exam without masses or fullness         Uterus: normal by palpation        Pap smear: performed Msk:  No deformity or scoliosis noted of thoracic or lumbar spine.   Pulses:  R and L carotid,radial,femoral,dorsalis pedis and posterior tibial pulses are full and equal bilaterally Extremities:  No clubbing, cyanosis, edema, or deformity noted with normal full range of motion of all joints.   Neurologic:  No cranial nerve deficits noted. Station and gait are normal. Plantar reflexes are down-going bilaterally. DTRs are symmetrical throughout. Sensory, motor and coordinative functions appear intact. Skin:  Intact without suspicious lesions or rashes Cervical Nodes:  No lymphadenopathy noted Axillary Nodes:  No palpable lymphadenopathy Inguinal Nodes:  No significant adenopathy Psych:  Cognition and judgment appear intact. Alert and cooperative with normal attention span and concentration. No apparent delusions, illusions, hallucinations   Impression & Recommendations:  Problem # 1:  GERD (ICD-530.81) Assessment Improved  Her updated medication list for this problem includes:    Prilosec Otc 20 Mg Tbec (Omeprazole magnesium) ..... Once daily  Orders: Venipuncture (04540) TLB-Lipid Panel (80061-LIPID) TLB-BMP (Basic Metabolic Panel-BMET) (80048-METABOL) TLB-CBC Platelet - w/Differential (85025-CBCD) TLB-Hepatic/Liver Function Pnl (80076-HEPATIC) TLB-TSH (Thyroid Stimulating Hormone) (98119-JYN) Prescription Created Electronically [phone]) Medicare -1st Annual Wellness Visit [phone]) Urinalysis-dipstick only (Medicare patient) (65784ON)  Problem # 2:  HYPERTENSION (ICD-401.9) Assessment: Improved  The following medications were removed from the medication list:    Hydrochlorothiazide 25 Mg Tabs (Hydrochlorothiazide) .Marland Kitchen... Take one tab by mouth once daily Her updated medication list for this problem includes:    Maxzide-25 37.5-25 Mg Tabs (Triamterene-hctz) .Marland Kitchen... Take 1 tablet by mouth every morning  Orders: Venipuncture  (62952) TLB-Lipid Panel (80061-LIPID) TLB-BMP (Basic Metabolic Panel-BMET) (80048-METABOL) TLB-CBC Platelet - w/Differential (85025-CBCD) TLB-Hepatic/Liver Function Pnl (80076-HEPATIC) TLB-TSH (Thyroid Stimulating Hormone) (84132-GMW) Prescription Created Electronically [phone]) Medicare -1st Annual Wellness Visit [phone]) Urinalysis-dipstick only (Medicare patient) (40347QQ) EKG w/ Interpretation (93000)  Problem # 3:  HYPERLIPIDEMIA (ICD-272.4) Assessment: Improved  Her updated medication list for this problem includes:    Zetia 10 Mg Tabs (Ezetimibe) ..... Every morning    Questran Light 4 Gm/dose Powd (Cholestyramine light) .Marland Kitchen... 1 scoop daily  Orders: Venipuncture (59563) TLB-Lipid Panel (80061-LIPID) TLB-BMP (Basic Metabolic Panel-BMET) (80048-METABOL) TLB-CBC Platelet - w/Differential (85025-CBCD) TLB-Hepatic/Liver Function Pnl (80076-HEPATIC) TLB-TSH (Thyroid Stimulating Hormone) (87564-PPI) Prescription Created Electronically [phone]) Medicare -1st Annual Wellness Visit [phone]) Urinalysis-dipstick only (Medicare patient) (63016WF) EKG w/ Interpretation (93000)  Problem # 4:  Preventive Health Care (ICD-V70.0) Assessment: Unchanged  Complete Medication List: 1)  Prilosec Otc 20 Mg Tbec (Omeprazole magnesium) .... Once daily 2)  Seroquel 25 Mg Tabs (Quetiapine fumarate) .... Take 1 tablet by mouth at bedtime every other night 3)  Remeron 15 Mg Tabs (Mirtazapine) .... Take 1/2 at bedtime every other night 4)  Calcium 500 500 Mg Tabs (Calcium carbonate) .... 2 daily 5)  Aspirin 81 Mg Tbec (Aspirin) .... Once daily 6)  Tamoxifen Citrate 10 Mg Tabs (Tamoxifen citrate) 7)  Lumigan 0.03 % Soln (Bimatoprost) .... At bedtime 8)  Zetia 10 Mg Tabs (Ezetimibe) .... Every morning 9)  Vitamin D3 1000 Unit Caps (Cholecalciferol) .... Take one tab once daily 10)  Maxzide-25 37.5-25 Mg Tabs (Triamterene-hctz) .... Take 1 tablet by mouth every morning 11)  Premarin 0.625 Mg/gm Crea  (Estrogens, conjugated) .... Apply 2 x week 12)  Questran Light 4 Gm/dose Powd (Cholestyramine light) .Marland Kitchen.. 1 scoop daily  Other Orders: Admin 1st Vaccine (09323) Flu Vaccine 56yrs + (55732)  Patient Instructions: 1)  Please schedule a follow-up appointment in 1 year. 2)  Decrease the aspirin to 81 mg Monday, Wednesday, Friday. 3)  Use the Premarin vaginal cream.  Small amounts twice weekly. 4)  Please schedule a follow-up appointment in 1 year. 5)  It is important that you exercise regularly at least 20 minutes 5 times a week. If you develop chest pain, have severe difficulty breathing, or feel very tired , stop exercising immediately and seek medical attention. 6)  Schedule your mammogram. 7)  Schedule a colonoscopy/sigmoidoscopy to help detect colon cancer. 8)  Take calcium +Vitamin D daily. 9)  Choose your Health care Power of Attorney and/or prepare a Living Will. Prescriptions: QUESTRAN LIGHT 4 GM/DOSE POWD (CHOLESTYRAMINE LIGHT) 1 scoop daily  #1 can x 11   Entered and Authorized by:   Roderick Pee MD   Signed by:   Roderick Pee MD on 04/23/2010  Method used:   Electronically to        [facility]  [place] [address] (retail)       [address]       [place], [place]  [address]       Ph: [phone] or [phone]       Fax: [phone]   RxID:   [id number] ZETIA 10 MG  TABS (EZETIMIBE) every morning  #100 x 3   Entered and Authorized by:   Roderick Pee MD   Signed by:   Roderick Pee MD on 04/23/2010   Method used:   Electronically to        [facility]  [place] [address] (retail)       [address]       [place], [place]  [address]       Ph: [phone] or [phone]       Fax: [phone]   RxID:   [id number] REMERON 15 MG  TABS (MIRTAZAPINE) take 1/2 at bedtime every other night  #50 x 3   Entered and Authorized by:   Roderick Pee MD   Signed by:   Roderick Pee MD on 04/23/2010   Method used:   Electronically to        [facility]  [place] [address] (retail)        [address]       [place], [place]  [address]       Ph: [phone] or [phone]       Fax: [phone]   RxID:   [id number] SEROQUEL 25 MG  TABS (QUETIAPINE FUMARATE) Take 1 tablet by mouth at bedtime every other night  #100 x 3   Entered and Authorized by:   Roderick Pee MD   Signed by:   Roderick Pee MD on 04/23/2010   Method used:   [facility]ronically to        [facility]  [place] [address] (retail)       [address]       [place], [place]  [address]       Ph: [phone] or [phone]       Fax: [phone]   RxID:   [id number] PRILOSEC OTC 20 MG  TBEC (OMEPRAZOLE MAGNESIUM) once daily  #100 x 3   Entered and Authorized by:   Roderick Pee MD   Signed by:   Roderick Pee MD on 04/23/2010   Method used:   [facility]ronically to        [facility]  [place] [address] (retail)       [address]       [place], [place]  [address]       Ph: [phone] or [phone]       Fax: [phone]   RxID:   [id number] PREMARIN 0.625 MG/GM CREA (ESTROGENS, CONJUGATED) apply 2 x week  #3 tubes x 3   Entered and Authorized by:   Roderick Pee MD   Signed by:   Roderick Pee MD on 04/23/2010   Method used:   [facility]ically [facility]        [facility]  [place] [address] (retail)       [address]       [place], [place]  [address]       Ph: [phone] or [phone]       Fax: [phone]   RxID:   [id number]  MAXZIDE-25 37.5-25 MG TABS (TRIAMTERENE-HCTZ) Take 1 tablet by mouth every morning  #100 x 3   Entered and Authorized by:   Roderick Pee MD   Signed by:   Roderick Pee MD on 04/23/2010   Method used:   [facility]        [facility]  [place] [address] (retail)       [address]       [place], [place]  [address]       Ph: [phone] or [phone]       Fax: [phone]   RxID:   [id number]    Orders Added: 1)  Venipuncture [27253] 2)  TLB-Lipid Panel [80061-LIPID] 3)  TLB-BMP (Basic Metabolic Panel-BMET) [80048-METABOL] 4)  TLB-CBC Platelet - w/Differential  [85025-CBCD] 5)  TLB-Hepatic/Liver Function Pnl [80076-HEPATIC] 6)  TLB-TSH (Thyroid Stimulating Hormone) [84443-TSH] 7)  Prescription Created Electronically [G8553] 8)  Medicare -1st Annual Wellness Visit [G0438] 9)  Urinalysis-dipstick only (Medicare patient) [81003QW] 10)  EKG w/ Interpretation [93000] 11)  Admin 1st Vaccine [90471] 12)  Flu Vaccine 66yrs + [66440]   Immunization History:  Zostavax History:    Zostavax # 1:  zostavax (07/02/2003)   Immunization History:  Zostavax History:    Zostavax # 1:  Zostavax (07/02/2003)  Laboratory Results   Urine Tests    Routine Urinalysis   Color: yellow Appearance: Clear Glucose: negative   (Normal Range: Negative) Bilirubin: negative   (Normal Range: Negative) Ketone: negative   (Normal Range: Negative) Spec. Gravity: 1.020   (Normal Range: 1.003-1.035) Blood: trace-intact   (Normal Range: Negative) pH: 5.5   (Normal Range: 5.0-8.0) Protein: trace   (Normal Range: Negative) Urobilinogen: 0.2   (Normal Range: 0-1) Nitrite: negative   (Normal Range: Negative) Leukocyte Esterace: trace   (Normal Range: Negative)    Comments: Rita Ohara  April 23, 2010 1:43 PM

## 2010-08-03 LAB — LACTATE DEHYDROGENASE: LDH: 158 U/L (ref 94–250)

## 2010-08-03 LAB — COMPREHENSIVE METABOLIC PANEL
ALT: 16 U/L (ref 0–35)
AST: 16 U/L (ref 0–37)
Albumin: 3.5 g/dL (ref 3.5–5.2)
Alkaline Phosphatase: 66 U/L (ref 39–117)
BUN: 22 mg/dL (ref 6–23)
CO2: 20 mEq/L (ref 19–32)
Calcium: 9.1 mg/dL (ref 8.4–10.5)
Chloride: 109 mEq/L (ref 96–112)
Creatinine, Ser: 1.34 mg/dL — ABNORMAL HIGH (ref 0.40–1.20)
Glucose, Bld: 103 mg/dL — ABNORMAL HIGH (ref 70–99)
Potassium: 3.5 mEq/L (ref 3.5–5.3)
Sodium: 140 mEq/L (ref 135–145)
Total Bilirubin: 0.2 mg/dL — ABNORMAL LOW (ref 0.3–1.2)
Total Protein: 6.6 g/dL (ref 6.0–8.3)

## 2010-08-03 LAB — CANCER ANTIGEN 27.29: CA 27.29: 10 U/mL (ref 0–39)

## 2010-08-03 LAB — VITAMIN D 25 HYDROXY (VIT D DEFICIENCY, FRACTURES): Vit D, 25-Hydroxy: 53 ng/mL (ref 30–89)

## 2010-08-09 ENCOUNTER — Encounter (HOSPITAL_BASED_OUTPATIENT_CLINIC_OR_DEPARTMENT_OTHER): Payer: Medicare Other | Admitting: Oncology

## 2010-08-09 DIAGNOSIS — C50419 Malignant neoplasm of upper-outer quadrant of unspecified female breast: Secondary | ICD-10-CM

## 2010-09-15 LAB — BASIC METABOLIC PANEL
BUN: 22 mg/dL (ref 6–23)
CO2: 25 mEq/L (ref 19–32)
Calcium: 9.1 mg/dL (ref 8.4–10.5)
Chloride: 107 mEq/L (ref 96–112)
Creatinine, Ser: 1.3 mg/dL — ABNORMAL HIGH (ref 0.4–1.2)
GFR calc Af Amer: 49 mL/min — ABNORMAL LOW (ref >60)
GFR calc non Af Amer: 40 mL/min — ABNORMAL LOW (ref >60)
Glucose, Bld: 76 mg/dL (ref 70–99)
Potassium: 4 mEq/L (ref 3.5–5.1)
Sodium: 137 mEq/L (ref 135–145)

## 2010-09-15 LAB — CBC
HCT: 38.2 % (ref 36.0–46.0)
Hemoglobin: 12.9 g/dL (ref 12.0–15.0)
MCH: 31.8 pg (ref 26.0–34.0)
MCHC: 33.9 g/dL (ref 30.0–36.0)
MCV: 93.8 fL (ref 78.0–100.0)
Platelets: 195 10*3/uL (ref 150–400)
RBC: 4.07 MIL/uL (ref 3.87–5.11)
RDW: 13.5 % (ref 11.5–15.5)
WBC: 8.1 10*3/uL (ref 4.0–10.5)

## 2010-09-15 LAB — URINALYSIS, ROUTINE W REFLEX MICROSCOPIC
Bilirubin Urine: NEGATIVE
Glucose, UA: NEGATIVE mg/dL
Hgb urine dipstick: NEGATIVE
Ketones, ur: NEGATIVE mg/dL
Nitrite: NEGATIVE
Protein, ur: NEGATIVE mg/dL
Specific Gravity, Urine: >1.03 — ABNORMAL HIGH (ref 1.005–1.030)
Urobilinogen, UA: 0.2 mg/dL (ref 0.0–1.0)
pH: 5.5 (ref 5.0–8.0)

## 2010-09-15 LAB — PROTIME-INR
INR: 0.94 (ref 0.00–1.49)
Prothrombin Time: 12.5 seconds (ref 11.6–15.2)

## 2010-09-15 LAB — APTT: aPTT: 24 seconds (ref 24–37)

## 2010-09-15 LAB — URINE MICROSCOPIC-ADD ON

## 2010-11-13 NOTE — Discharge Summary (Signed)
NAMELUBERTHA, Jacqueline [name]           ACCOUNT NO.:  [id number]   MEDICAL RECORD NO.:  [id number]          PATIENT TYPE:  INP   LOCATION:  5020                         FACILITY:  [facility]   PHYSICIAN:  Robert A. Thurston Hole, M.D. DATE OF BIRTH:  Apr 26, 1935   DATE OF ADMISSION:  06/06/2008  DATE OF DISCHARGE:  06/08/2008                               DISCHARGE SUMMARY   ADMITTING DIAGNOSES:  End-stage degenerative joint disease, left knee  with a metal allergy, hypertension, reflux.   DISCHARGE DIAGNOSES:  End-stage degenerative joint disease, left knee  status post total knee replacement, metal allergy, hypertension, reflux,  postoperative blood loss anemia.   HISTORY OF PRESENT ILLNESS:  The patient is a 76 year old white female  with a history of end-stage DJD of a right knee.  She has failed  conservative care including anti-inflammatories and articular cortisone  injections.  Risks, benefits, and possible complications of a total knee  have been discussed with the patient.  We are using a Oxinium knee due  to the fact that she has a NICKEL allergy.   Procedures in-house on June 06, 2008, the patient underwent a left  total knee replacement by Dr. Thurston Hole, left femoral nerve block by  Anesthesia, and auto-VAC transfusion.  She tolerated all the procedures  well and was admitted postoperatively for pain control, DVT prophylaxis,  and physical therapy.  She was placed 0-90 in her CPM in the recovery  room and tolerated this without difficulty.   Postop day #1, hemoglobin was 12.6.  She was afebrile.  Surgical wounds  were well approximated.  Her drain was discontinued.  Her Foley was  discontinued.  Her PCA was discontinued.  She received 500 mL bolus of  normal saline and discharge planning was begun.  Postop day 2, the  patient was doing well.  She had no complaints.  Her pain was under  control with p.o. pain medicine.  Her urine culture was negative.  Hemoglobin was stable at 12.3.   She had a T-max of 102, and temperature  on exam was 100.3.  She was discharged to home in stable condition,  weightbearing as tolerated.   DISCHARGE MEDICATIONS:  1. Nexium 40 mg daily.  2. Lisinopril 10 mg hold until blood pressure greater than 120/80.  3. Phenergan 25 mg p.o. q.6 h. p.r.n.  4. Femhrt 1/5 mcg daily.  5. Vitamin D 1000 units daily.  6. Percocet one to two q. 4-6 hours p.r.n. pain.  7. Vitamin B12 daily.  8. Multivitamin daily.  9. Aspirin 81 mg daily.  Coumadin 5 mg one tablet daily until otherwise directed, Robaxin 500 mg  one p.o. q.6 h. p.r.n. spasm.   She will follow up with Dr. Thurston Hole on June 20, 2008.  She will call  with increased pain, increased swelling, increased redness, or a  temperature greater than 101.  She is weightbearing as tolerated,  regular diet, home CPM, home physical therapy, and occupational therapy,  as well as nursing.      Kirstin Shepperson, P.A.      Robert A. Thurston Hole, M.D.  Electronically Signed    KS/MEDQ  D:  08/17/2008  T:  08/18/2008  Job:  [id number]

## 2010-11-13 NOTE — Op Note (Signed)
Jacqueline [name], Jacqueline [name]           ACCOUNT NO.:  [id number]   MEDICAL RECORD NO.:  [id number]          PATIENT TYPE:  INP   LOCATION:  5020                         FACILITY:  [facility]   PHYSICIAN:  Robert A. Thurston Hole, M.D. DATE OF BIRTH:  Jul 18, 1934   DATE OF PROCEDURE:  06/13/2008  DATE OF DISCHARGE:                               OPERATIVE REPORT   PREOPERATIVE DIAGNOSIS:  Right knee degenerative joint disease.   POSTOPERATIVE DIAGNOSIS:  Right knee degenerative joint disease.   PROCEDURE:  Right total knee replacement using DePuy cemented total knee  system with #3 cemented femur, #3 cemented tibia with 10-mm polyethylene  RP tibial spacer, and 32-mm polyethylene cemented patella.   SURGEON:  Elana Alm. Thurston Hole, MD   ASSISTANT:  Julien Girt, PA   ANESTHESIA:  General.   OPERATIVE TIME:  1 hour and 20 minutes.   COMPLICATIONS:  None.   DESCRIPTION OF PROCEDURE:  Ms. Deutscher was brought to the operating  room on June 13, 2008, after a femoral nerve block was placed in the  holding area by Anesthesia.  She was placed on the operative table in  supine position.  She received Ancef 1 g IV preoperatively for  prophylaxis.  After being placed under general anesthesia, she had a  Foley catheter placed under sterile conditions.  Her right knee was  examined.  Range of motion from -5 to 125 degrees, mild varus deformity,  knee stable ligamentous exam with normal patellar tracking.  The right  leg was then prepped using sterile DuraPrep and draped using sterile  technique.  Leg was exsanguinated and a thigh tourniquet elevated to 365  mm.  Initially through a 15-cm longitudinal incision based over the  patella, initial exposure was made.  The underlying subcutaneous tissues  were incised along with skin incision.  A median arthrotomy was  performed revealing an excessive amount of normal-appearing joint fluid.  The articular surfaces were inspected.  She had grade 4 changes  medially, grade 3 and 4 changes laterally, and grade 3 and 4 changes in  the patellofemoral joint.  Osteophytes removed from the femoral condyles  and tibial plateau.  The medial and lateral meniscal remnants were  removed as well as the anterior cruciate ligament.  An intramedullary  drill was then drilled up the femoral canal for placement of the distal  femoral cutting jig, which was placed in the appropriate manner rotation  and a distal 11-mm cut was made.  The distal femur was incised.  A #3  was found to be the appropriate size.  A #3 cutting jig was placed and  the appropriate manner external rotation and then these cuts were made.  Proximal tibia was then exposed.  The tibial spines were removed with an  oscillating saw.  Intramedullary drill was drilled down the tibial canal  for placement of the proximal tibial cutting jig, which was placed in  the appropriate manner rotation.  A proximal 2-mm cut was made based off  the medial or lower side.  Spacer blocks were then placed in flexion and  extension.  A 10-mm blocks gave excellent balancing, excellent  stability, and excellent correction of her flexion and varus  deformities.  At this point, the proximal tibia was then exposed.  A #3  tibial baseplate trial was placed on the cut tibial surface with an  excellent fit and a keel cut was made.  The PCL box cutter was then  placed on the distal femur and these cuts were made.  At this point, a  #3 femoral trial was placed, #3 tibial baseplate trial with a 10-mm  polyethylene RP tibial spacer.  Knee was reduced, taken through a range  of motion from 0 to 125 degrees with excellent stability, excellent  correction of her flexion and varus deformities, and normal patellar  tracking.  A resurfacing 9-mm cut was then made on the patella and 3  locking holes placed for a 32-mm patellar trial.  This was then placed  and again patellofemoral tracking was evaluated and found to be normal.   At this point, it was felt that all the trial components were of  excellent size, fit, and stability.  They were then removed.  The knee  was then jet lavaged, irrigated with 3 L of saline.  The proximal tibia  was then exposed.  A #3 tibial baseplate with cement backing was  hammered into position with an excellent fit with excess cement being  removed from around the edges.  A #3 femoral component with cement  backing was hammered into position also with an excellent fit with  excess cement being removed from around the edges.  The 10-mm  polyethylene RP tibial spacer was placed on the tibial baseplate.  The  knee reduced, taken through a range of motion from 0 to 125 degrees with  excellent stability and excellent correction of her flexion and varus  deformities.  The 32-mm polyethylene cement backed patella was then  placed in its position and held there with a clamp.  After the cement  hardened, again patellofemoral tracking was evaluated and found to be  normal.  At this point, it was felt that all the components were of  excellent size, fit, and stability.  The knee was further irrigated with  saline.  The tourniquet was then released.  Hemostasis obtained with  cautery.  The arthrotomy was then closed with #1 Ethibond suture over 2  medium Hemovac drains.  Subcutaneous tissues closed with 0 and 2-0  Vicryl, subcuticular layer closed with 4-0 Monocryl.  Sterile dressings  were applied and a long-leg splint.  The patient then awakened,  extubated, and taken to recovery room in stable condition.  Needle and  sponge count correct x2 at the end of the case.  Neurovascular status  normal postoperatively as well.      Robert A. Thurston Hole, M.D.  Electronically Signed     RAW/MEDQ  D:  06/13/2008  T:  06/13/2008  Job:  [id number]

## 2010-11-16 NOTE — Op Note (Signed)
[place]  Patient:    Jacqueline [name], Jacqueline [name]                  MRN: [id number] Proc. Date: 10/21/00 Adm. Date:  [id number] Attending:  Eloise Levels                           Operative Report  PREOPERATIVE DIAGNOSIS:  A 2.2 cm left jowl/chin mass.  POSTOPERATIVE DIAGNOSIS:  A 3.0 cm left jowl/chin mass.  PROCEDURES: 1. Complex closure of 2.7 cm left jowl/chin incision. 2. Excision of 3.0 cm left jowl/chin mass.  SURGEON:  Mary A. Contogiannis, M.D.  ANESTHESIA:  1% lidocaine with epinephrine.  COMPLICATIONS:  None.  INDICATIONS FOR PROCEDURE:  The patient is a 76 year old Caucasian female who was referred by [facility]. Danella Deis, M.D., for evaluation of a left jowl/chin mass. She notes that the mass has been present for over two years and is growing. In particular, it has been growing more over the past year.  She denies any symptoms of infection of the lesion.  She also denies any history of trauma to this area.  Due to the location of the mass, Dr. Danella Deis has referred the patient to me for excision.  The patient requests that we proceed with excision at this time.  DESCRIPTION OF PROCEDURE:  The patient was brought in the procedure room and placed on the OR table in supine position.  The left face was prepped with Betadine and draped in a sterile fashion.  The skin and skin and subcutaneous tissues around the area of the left jowl/chin mass were then infiltrated with 1% lidocaine with epinephrine.  After adequate hemostasis and anesthesia had taken effect, the procedure was begun.  A skin incision was made over the central aspect of the mass.  The incision was made with a knife and carried through the subcutaneous tissues.  Upon entering the subcutaneous tissues, the mass appeared to be a lipoma clinically.  Using sharp dissection, the mass was then dissected free from the surrounding connective tissues.  The mass did appear to be above  the layer of the SMAS.  After the mass was thus excised, meticulous hemostasis was obtained of the area.  The wound was irrigated with saline.  The wound was then closed in complex fashion.  The deeper subcutaneous tissues and fatty tissues were closed using 4-0 Monocryl interrupted sutures.  The skin was then closed using a 4-0 Monocryl in the dermal layer and a 6-0 Prolene running baseball stitch on the skin.  The incision was dressed with bacitracin ointment and a Band-Aid.  There were no complications.  The patient tolerated the procedure well.  She was then discharged home from [facility] in stable condition.  She was given proper wound care instructions, including cleaning the incision with water and/or half-strength hydrogen peroxide as needed to keep any dried blood from collecting over it.  She is then to use bacitracin ointment three times a day on the incision.  She will finish out her perioperative antibiotics. Follow-up appointment will be tomorrow in the office. DD:  10/21/00 TD:  10/22/00 Job: [id number]

## 2010-11-16 NOTE — Op Note (Signed)
NAMEADYLEE, LEONARDO           ACCOUNT NO.:  [id number]   MEDICAL RECORD NO.:  [id number]          PATIENT TYPE:  INP   LOCATION:  2550                         FACILITY:  [facility]   PHYSICIAN:  Elana Alm. Thurston Hole, M.D. DATE OF BIRTH:  1935/02/12   DATE OF PROCEDURE:  09/17/2004  DATE OF DISCHARGE:                                 OPERATIVE REPORT   PREOPERATIVE DIAGNOSIS:  Left knee DJD.   POSTOPERATIVE DIAGNOSIS:  Left knee DJD.   PROCEDURE:  Left total knee replacement using DePuy computer assisted  navigation total knee system with #3 cemented femur, #3 cemented tibia with  10 mm polyethylene rotating platform tibial spacer and 35 mm polyethylene  cemented patella.   SURGEON:  Elana Alm. Thurston Hole, M.D.   ASSISTANT:  Julien Girt, P.A.   ANESTHESIA:  General.   OPERATIVE TIME:  1 hour 45 minutes.   COMPLICATIONS:  None.   DESCRIPTION OF PROCEDURE:  Ms. Sachdev was brought to operating room on  September 17, 2004, placed operative table in supine position. She has received  Ancef 1 gram IV preoperatively for prophylaxis. After being placed under  general anesthesia she had a Foley catheter placed under sterile conditions.  Her left knee was examined. Range of motion from -10 and 130 degrees with  mild varus deformity, knee stable to ligamentous exam with normal patellar  tracking. Left leg was prepped in sterile DuraPrep and draped using sterile  technique. Leg was exsanguinated and tourniquet elevated 350 mm. Initially,  through a 15 cm longitudinal incision based over the patella, initial  exposure was made.  The underlying subcutaneous tissues were incised in line  with the skin incision. A median arthrotomy was performed revealing  excessive amount of normal-appearing joint fluid. The articular surfaces  were inspected. She had grade 3 and 4 changes medially, grade 3 changes  laterally and grade 3 and 4 changes in the patellofemoral joint. Osteophytes  were removed from  the femoral condyles and tibial plateau. The medial and  lateral meniscal remnants were removed as well as anterior cruciate  ligament. At this point then to separate pins were placed in the proximal  tibia and in the distal femur for placement of the computer assisted  navigation components. These were each placed and then the computer  navigation system was activated. Initially the overall alignment showed that  the patient was then approximately 4.5 mm of varus. After this was done then  the navigation system was used in order to prepare the first distal femoral  cut which was a 10 mm distal cut which was made in the appropriate amount of  neutral position and the distal femoral cut was made with the navigation  system. After this was done, then the distal femoral cutting jig which was  placed in 3 degrees of external rotation again with computer assistance and  then the anterior, posterior and chamfer guide was placed and these cuts  were made. After this was done, then the proximal tibia was exposed. The  tibial spines were removed with an oscillating saw. The computer navigation  system was used to set the proximal  tibial cutting jig in neutral position  and using an 8 mm cut off the proximal medial or higher side. After this cut  was made, then the distal femoral PCL box cut cutter guide was placed and  then this box cut was made. After this was done, then the tibial punch was  used with the #3 tibial component which was found to be appropriate size and  the tibial punch was made. At this point then the #3 femoral trial was  placed. A #3 tibial base plate trial was placed withy the 10 mm polyethylene  RP spacer.  The knee was taken to a full range of motion and found to be  stable with excellent correction of her varus deformity to neutral. At this  point then the patella was sized. An 8 mm resurfacing cut was made with the  jig and then three locking holes were placed. At this point,  it was felt  that all the trial components were of excellent size, fit and stability.  They were then removed and the knee was then jet lavage irrigated with 3  liters of saline solution. The proximal tibia was then exposed and a #3  tibial base plate with cement backing was hammered into position with an  excellent fit with excess cement being removed from around the edges. The #3  femoral component with cement backing was hammered into position also with  an excellent fit with excess cement being removed from around the edges. The  10 mm polyethylene RP tibial spacer was then placed on the tibial base  plate. The knee taken through a full range of motion, found to have  excellent correction of her varus deformity with range of motion from zero  130 degrees. The 35 mm polyethylene cement backed patella was then placed  into its locking holes and held in position with a clamp. After the cement  hardened, patellofemoral tracking was evaluated and this was found to be  normal. At this point it was felt that all the components were excellent  size, fit and stability. The arthrotomy was then closed with #1 Ethilon  suture over two medium Hemovac drains, subcutaneous tissues closed with 0  and 2-0 Vicryl. Skin closed with 4-0 Monocryl. Steri-Strips were applied.  Sterile dressings and a long-leg splint applied. Tourniquet was released.  Hemovac injected with 0.25% Marcaine with epinephrine and clamped. The  patient then had a femoral nerve block placed by anesthesia for  postoperative pain control. She was then awakened, extubated, and taken to  recovery room in stable condition.  Needle and sponge counts correct x 2 at  the end of the case.       RAW/MEDQ  D:  09/17/2004  T:  09/17/2004  Job:  [id number]

## 2010-11-16 NOTE — Discharge Summary (Signed)
NAMEKIZZIE, Jacqueline [name]           ACCOUNT NO.:  [id number]   MEDICAL RECORD NO.:  [id number]          PATIENT TYPE:  INP   LOCATION:  5014                         FACILITY:  [facility]   PHYSICIAN:  Elana Alm. Thurston Hole, M.D. DATE OF BIRTH:  11/20/1934   DATE OF ADMISSION:  09/17/2004  DATE OF DISCHARGE:  09/20/2004                                 DISCHARGE SUMMARY   ADMITTING DIAGNOSES:  1.  End-stage degenerative joint disease left knee.  2.  Hypertension.  3.  Gastroesophageal reflux.  4.  Osteopenia.  5.  History of migraines.   DISCHARGE DIAGNOSES:  1.  End-stage degenerative joint disease left knee.  2.  Hypertension.  3.  Gastroesophageal reflux.  4.  Osteopenia.  5.  History of migraines.   HISTORY OF PRESENT ILLNESS:  The patient is a 76 year old white female with  a history of end-stage DJD of both knees.  Left is more painful than right.  She has failed conservative care, including antiinflammatories and articular  cortisone injections and physical therapy.  She understands the risks,  benefits, and possible complications of surgery and is without question.  She has been medically cleared for surgery by Dr. Kelle Darting.   PROCEDURES IN-HOUSE:  On 09/17/2004, the patient underwent a left total knee  replacement by Dr. Thurston Hole.  She tolerated the procedure well.  Postoperatively, she had a femoral nerve block by anesthesia.  This  procedure was also tolerated well.   She was admitted for postop pain control, DVT prophylaxis, and physical  therapy.  Postop day one, the patient had difficulty with nausea overnight,  otherwise is doing well.  Her hemoglobin is 11.2.  Her INR is 1.1.  Her  white cell count is 11.4.  She does have a UA that shows bacteria and  leukocytes.  Therefore, she was started on Cipro 500 mg one p.o. b.i.d. for  seven days.  Her dressing was changed.  Her PCA was discontinued.  She was  started on Percocet for pain.  She is weightbearing as tolerated.   She  progressed with physical therapy.  Postop day two, no nausea.  Continued to  improve with physical therapy.  Her hemoglobin is 10.5, her white cell count  is 10.1.  Her surgical wound is well approximated.  Her INR is 1.6.  Her IV  was Hep-Locked.  Her Foley was discontinued.  She has continued to work with  physical therapy and improve.  Postop day three, the patient was discharged  to home in stable condition, weightbearing as tolerated, on a regular diet.   DISCHARGE MEDICATIONS:  1.  Mepergan Fortis 1-2 q.4-6 h. p.r.n. pain.  2.  Robaxin 1 tablet q.4-6 h. p.r.n. muscle spasm.  3.  Coumadin 2.5 mg 1 tablet daily.  4.  Prilosec 20 mg 1 tablet daily.  5.  Lisinopril 5 mg 1 tablet daily.  6.  Hydrochlorothiazide 25 mg 1 tablet daily.  7.  Seroquel 25 mg 1 tablet daily.  8.  Klor-Con 2 tablets daily.  9.  Colace 100 mg 1 tablet b.i.d.  10. Senokot-S 2 tablets before dinner.   She  is weightbearing as tolerated, on a regular diet.  She has been  instructed to use her CPM 0-90 eight hours a day.  She has been instructed  to elevate her left heel on a folded pillow for 30 minutes every morning, to  do daily dressing changes.  She is to call with increased pain, increased  drainage, increased redness, or a temperature greater than 101.  We will see  her back in the office on October 01, 2004.       KS/MEDQ  D:  11/07/2004  T:  11/07/2004  Job:  [id number]

## 2010-11-16 NOTE — Op Note (Signed)
Jacqueline, [name]           ACCOUNT NO.:  [id number]   MEDICAL RECORD NO.:  [id number]          PATIENT TYPE:  AMB   LOCATION:  DSC                          FACILITY:  [facility]   PHYSICIAN:  [name], M.D.DATE OF BIRTH:  27-Sep-1934   DATE OF PROCEDURE:  05/02/2006  DATE OF DISCHARGE:                                 OPERATIVE REPORT   OFFICE MEDICAL NUMBER:  [id number].   PREOPERATIVE DIAGNOSIS:  Breast cancer, left side upper outer quadrant.   POSTOPERATIVE DIAGNOSIS:  Breast cancer, left side upper outer quadrant.   OPERATION:  Needle-guided left lumpectomy with blue dye injection and  axillary sentinel lymph node biopsy (three nodes).   SURGEON:  [name], M.D.   ANESTHESIA:  General.   CLINICAL HISTORY:  Jacqueline [name] is a 76 year old recently found to have a  left breast cancer at upper outer quadrant.  She elected to proceed to  lumpectomy and sentinel node evaluation, with plans for postop radiation  therapy.   DESCRIPTION OF PROCEDURE:  The patient was seen in the holding area and she  had no further questions.  We confirmed that the left side was the operative  side, and she and I both initialed that.  She already had her guidewire  placed at the time I saw her, and I reviewed those films -- there appeared  to be good placement of the wire in close approximation to the metal clip  that had been left behind at the time of her biopsy.  The patient had  already been injected with one microcurie of technetium 99 filtered sulfur  colloid on the left breast.   The patient was taken to the operating room, and after satisfactory general  anesthesia had been obtained, the left breast was prepped.  I injected  dilute methylene blue (2 cc of methylene blue and 3 cc of and injectable  saline).  This was injected circumareolarly.   The entire breast was prepped and draped.  Time-out occurred prior to the  injection of the methylene blue.   I started by  making a curvilinear incision at the upper outer quadrant of  the breast, about 2 cm inferior to the guidewire entry site.  I used the  needle localizing films to estimate the location of the tumor, as it was  nonpalpable.  I divided about 2 cm of subcutaneous tissue, and then was able  to identify the guidewire as it tracked down towards the tumor.  Using  cautery, I divided the breast tissue down to the chest wall superior to the  guidewire, and then medially to the guidewire.  I then came under the area  of the tumor, taking the fascia off so that I had the breast tissue freed up  on the area to include where I was going to go inferiorly.  I then divided  the breast inferior to the guidewire down to the chest wall, and finally  laterally excising the area of the tumor with what appeared to me to be a  large margin of tissue around the palpable mass.  The tumor was palpable  once  I had it out, and I thought that we had good margins in all directions.   This was sent for specimen mammography.  I injected some Marcaine to help  with postop analgesia.  I used the cautery to make sure everything was  completely dry, and carefully palpated the area to be sure I did not feel  any other abnormal areas.  At that point I placed a moist pack in the  incision, and turned my attention to the axilla.  Using the Neoprobe, I  identified a hot area and made a transverse incision.  As soon as I divided  a little of the subcutaneous tissue and got down towards the axillary fat, I  noticed a couple of blue lymphatics.  I traced those a little bit  posteriorly and encountered a blue lymph node, which was excised.  This had  counts up to 600.  I did not immediately see any other blue lymph nodes, but  using a Neoprobe I found another hot area that was actually a little bit  more proximal and anterior.  Then dissection in that area revealed another  blue lymphatic coming into a very bright blue lymph node, and  this was  likewise excised and had counts up to 500.   Again using the Neoprobe, I found another hot area; but these counts were  only up to about 100, but this was a little bit more inferior and slightly  more superior than the original lymph node that was removed.   The area appeared to be soft lymph node that did not appear to have any blue  dye in it.  This was excised and had counts of about 140.  At this point,  there were no other palpable abnormal lymph nodes in the axilla and no  counts above about 30-40; and no evidence of any other blue lymphatics.   I injected about 10 cc of 0.25% plain Marcaine here, and then closed this  incision with some 3-0 Vicryl followed by 4-0 Monocryl subcuticular.  Attention was turned back to the breast incision and the pack was removed.  This had remained completely dry while I was doing the axillary work.  I  closed this incision with 2 layers of subcutaneous tissue to try to get a  skin to the seroma cavity margin, such that she might be a candidate for  MammoSite.  The skin was closed with 4-0 Monocryl subcuticular.   Radiology reported that the specimen mammogram contained the lesion.  Pathology reported that the touch preps on the margins of the lumpectomy  were negative and that all three sentinel lymph nodes were negative for  metastatic disease.   At this point we applied some Dermabond to the incisions.  Sterile dressings  were placed.  The patient tolerated the procedure well.  There were no  operative complications.  There was minimal blood loss.      [name], M.D.  Electronically Signed     CJS/MEDQ  D:  05/02/2006  T:  05/02/2006  Job:  [id number]   cc:   Tinnie Gens A. Tawanna Cooler, MD

## 2010-11-16 NOTE — Op Note (Signed)
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   OPERATION:  Re-excision inferior margin prior lumpectomy site with  MammoSite placement.   SURGEON:  [name], M.D.   ANESTHESIA:  General.   CLINICAL HISTORY:  Jacqueline [name] is a 76 year old lady who presented with  a very small left breast cancer upper outer quadrant.  After excisional  lumpectomy she had a small invasive cancer but some surrounding DCIS  which extended to the inferior margin.  After consultation with medical  and radiation oncologists, it was decided to re-excise the inferior  margin and place MammoSite for postop radiation therapy.   DESCRIPTION OF PROCEDURE:  The patient was seen in the holding area and  she had no further questions.  She confirmed that MammoSite placement  and re-excision of the lumpectomy site was the planned procedure.  We  initialed the left breast as the operative side.   The patient taken to the operating room and after satisfactory general  anesthesia had been obtained, the left breast was prepped and draped.  The time-out occurred.   I opened the old incision and we had about 2 cm to get through to get  down to the lumpectomy cavity.  Once I entered the lumpectomy cavity, I  used cautery, excised the entire inferior margin.  I tried to get about  1 cm of additional margin.  One area that was a little thin I took  additional tissue so I took two pieces of tissue to get complete  excision of the inferior margin all the way down to and including  fascia.   Once hemostasis was achieved, I put some local in to help with postop  analgesia.   I used a Foley catheter with the 30 mL balloon, inflated to 45 mL to see  what sort of the size cavity I had and I thought that this would  adequately fill the cavity once we had it closed.   This point I made a small incision medial to the lumpectomy incision.  Using the trocar I made an entry into the lumpectomy cavity and placed  the MammoSite device up through this tract.  I inflated to about 35 mL  and I thought we would have excellent filling of the cavity with  approximately 35-45 mL of volume.   The MammoSite was deflated.  Incision was closed with two layers of 3-0  Vicryl in the subcu to reapproximate that so that we would have a nice  skin to balloon distance.  I then placed some 3-0 Monocryl subcuticular  as well.   Now I inflated the balloon to 35 mL and I thought we had pretty good  conformance with a nice symmetric balloon.  I added another 5 mL just to  be sure we had it completely covered.  This did not appear to put any  tension on the external wound.  I had 1.9 cm of skin to balloon  distance.   This point the wound was cleaned off and Steri-Strips applied as well as  sterile dressings.  The patient tolerated procedure well.  There were no  operative complications.  All counts were correct.      [name], M.D.  Electronically Signed     CJS/MEDQ  D:  05/29/2006  T:  05/29/2006  Job:  [id number]   cc:   Artist Pais Kathrynn Running, M.D.  Fax: [phone]

## 2010-12-03 ENCOUNTER — Encounter (INDEPENDENT_AMBULATORY_CARE_PROVIDER_SITE_OTHER): Payer: Self-pay | Admitting: Surgery

## 2010-12-17 ENCOUNTER — Other Ambulatory Visit: Payer: Self-pay | Admitting: Oncology

## 2010-12-17 DIAGNOSIS — N6489 Other specified disorders of breast: Secondary | ICD-10-CM

## 2010-12-24 ENCOUNTER — Other Ambulatory Visit: Payer: Self-pay | Admitting: Oncology

## 2010-12-24 ENCOUNTER — Ambulatory Visit
Admission: RE | Admit: 2010-12-24 | Discharge: 2010-12-24 | Disposition: A | Discharge status: Home / Self Care | Payer: Medicare Other | Source: Ambulatory Visit | Attending: Oncology | Admitting: Oncology

## 2010-12-24 DIAGNOSIS — N6489 Other specified disorders of breast: Secondary | ICD-10-CM

## 2010-12-24 DIAGNOSIS — N63 Unspecified lump in unspecified breast: Secondary | ICD-10-CM

## 2010-12-28 ENCOUNTER — Ambulatory Visit
Admission: RE | Admit: 2010-12-28 | Discharge: 2010-12-28 | Disposition: A | Discharge status: Home / Self Care | Payer: Medicare Other | Source: Ambulatory Visit | Attending: Oncology | Admitting: Oncology

## 2010-12-28 DIAGNOSIS — N63 Unspecified lump in unspecified breast: Secondary | ICD-10-CM

## 2011-01-03 ENCOUNTER — Other Ambulatory Visit: Payer: Self-pay | Admitting: Diagnostic Radiology

## 2011-01-03 ENCOUNTER — Ambulatory Visit: Admission: RE | Admit: 2011-01-03 | Payer: Medicare Other | Source: Ambulatory Visit

## 2011-01-03 ENCOUNTER — Ambulatory Visit
Admission: RE | Admit: 2011-01-03 | Discharge: 2011-01-03 | Disposition: A | Discharge status: Home / Self Care | Payer: Medicare Other | Source: Ambulatory Visit | Attending: Oncology | Admitting: Oncology

## 2011-01-03 ENCOUNTER — Other Ambulatory Visit: Payer: Self-pay | Admitting: Oncology

## 2011-01-03 DIAGNOSIS — N63 Unspecified lump in unspecified breast: Secondary | ICD-10-CM

## 2011-01-07 ENCOUNTER — Telehealth (INDEPENDENT_AMBULATORY_CARE_PROVIDER_SITE_OTHER): Payer: Self-pay

## 2011-01-07 ENCOUNTER — Other Ambulatory Visit: Payer: Self-pay | Admitting: Oncology

## 2011-01-07 DIAGNOSIS — C50911 Malignant neoplasm of unspecified site of right female breast: Secondary | ICD-10-CM

## 2011-01-07 NOTE — Telephone Encounter (Signed)
Lee from the breast center call re: Jacqueline [name]. Nedra Hai wants to know if there is anyway to get [name] in sooner to see Streck. She had history of cancer in left breast and now just found cancer in right breast. Patient not aware yet of the cancer.Please call Nedra Hai back to discuss patient appointment

## 2011-01-14 ENCOUNTER — Ambulatory Visit
Admission: RE | Admit: 2011-01-14 | Discharge: 2011-01-14 | Disposition: A | Discharge status: Home / Self Care | Payer: Medicare Other | Source: Ambulatory Visit | Attending: Oncology | Admitting: Oncology

## 2011-01-14 ENCOUNTER — Other Ambulatory Visit: Payer: Self-pay | Admitting: Oncology

## 2011-01-14 ENCOUNTER — Other Ambulatory Visit: Payer: Self-pay | Admitting: Diagnostic Radiology

## 2011-01-14 DIAGNOSIS — R928 Other abnormal and inconclusive findings on diagnostic imaging of breast: Secondary | ICD-10-CM

## 2011-01-14 DIAGNOSIS — D0510 Intraductal carcinoma in situ of unspecified breast: Secondary | ICD-10-CM | POA: Insufficient documentation

## 2011-01-14 DIAGNOSIS — C50911 Malignant neoplasm of unspecified site of right female breast: Secondary | ICD-10-CM

## 2011-01-14 MED ORDER — GADOBENATE DIMEGLUMINE 529 MG/ML IV SOLN
7.0000 mL | Freq: Once | INTRAVENOUS | Status: AC | PRN
  Administered 2011-01-14: 7 mL via INTRAVENOUS

## 2011-01-29 ENCOUNTER — Other Ambulatory Visit (INDEPENDENT_AMBULATORY_CARE_PROVIDER_SITE_OTHER): Payer: Self-pay | Admitting: Surgery

## 2011-01-29 ENCOUNTER — Encounter (HOSPITAL_COMMUNITY)
Admission: RE | Admit: 2011-01-29 | Discharge: 2011-01-29 | Disposition: A | Discharge status: Home / Self Care | Payer: Medicare Other | Source: Ambulatory Visit | Attending: Surgery | Admitting: Surgery

## 2011-01-29 ENCOUNTER — Ambulatory Visit (INDEPENDENT_AMBULATORY_CARE_PROVIDER_SITE_OTHER): Payer: Medicare Other | Admitting: Surgery

## 2011-01-29 ENCOUNTER — Encounter (INDEPENDENT_AMBULATORY_CARE_PROVIDER_SITE_OTHER): Payer: Self-pay | Admitting: Surgery

## 2011-01-29 VITALS — BP 140/94 | HR 80 | Temp 96.1°F | Ht 63.0 in | Wt 166.4 lb

## 2011-01-29 DIAGNOSIS — D0590 Unspecified type of carcinoma in situ of unspecified breast: Secondary | ICD-10-CM

## 2011-01-29 DIAGNOSIS — D0510 Intraductal carcinoma in situ of unspecified breast: Secondary | ICD-10-CM

## 2011-01-29 DIAGNOSIS — C50911 Malignant neoplasm of unspecified site of right female breast: Secondary | ICD-10-CM

## 2011-01-29 DIAGNOSIS — C50919 Malignant neoplasm of unspecified site of unspecified female breast: Secondary | ICD-10-CM

## 2011-01-29 LAB — CBC
HCT: 36 % (ref 36.0–46.0)
Hemoglobin: 12.5 g/dL (ref 12.0–15.0)
MCH: 30.9 pg (ref 26.0–34.0)
MCHC: 34.7 g/dL (ref 30.0–36.0)
MCV: 89.1 fL (ref 78.0–100.0)
Platelets: 156 10*3/uL (ref 150–400)
RBC: 4.04 MIL/uL (ref 3.87–5.11)
RDW: 12.7 % (ref 11.5–15.5)
WBC: 8.3 10*3/uL (ref 4.0–10.5)

## 2011-01-29 LAB — BASIC METABOLIC PANEL
BUN: 17 mg/dL (ref 6–23)
CO2: 29 mEq/L (ref 19–32)
Calcium: 9.1 mg/dL (ref 8.4–10.5)
Chloride: 108 mEq/L (ref 96–112)
Creatinine, Ser: 1.24 mg/dL — ABNORMAL HIGH (ref 0.50–1.10)
GFR calc Af Amer: 51 mL/min — ABNORMAL LOW (ref >60)
GFR calc non Af Amer: 42 mL/min — ABNORMAL LOW (ref >60)
Glucose, Bld: 102 mg/dL — ABNORMAL HIGH (ref 70–99)
Potassium: 3.6 mEq/L (ref 3.5–5.1)
Sodium: 143 mEq/L (ref 135–145)

## 2011-01-29 LAB — SURGICAL PCR SCREEN
MRSA, PCR: NEGATIVE
Staphylococcus aureus: NEGATIVE

## 2011-01-29 NOTE — Patient Instructions (Signed)
We will schedule you for a lumpectomy. I will check with her radiation oncologist about postoperative radiation options. I will also touch base with Dr. Donnie Coffin about genetic counseling. The consensus at the breast cancer conference was that you should be a candidate for a lumpectomy.

## 2011-01-29 NOTE — Progress Notes (Signed)
Jacqueline [name] is a 76 y.o. female.    Chief Complaint  Patient presents with  . Other    New patient-br ca  . Other    HPI HPI This patient has a history of a small invasive ductal carcinoma of the left breast dating back to 2007 she recently had a mammogram and an abnormality found in the right breast. A biopsy was done showing DCIS. Two adjacent areas were found on mammography. One was biopsied and found to be LCIS, and the other area was a benign hamartoma. She is asymptomatic from this breast cancer. Her MRI showed no lesion on the left side so no evidence of recurrence. She was presented at the breast cancer conference and the recommendation apparently was for lumpectomy and genetic counseling. I'm not sure if the possibility of MammoSite radiation therapy was discussed yet. She is interested in doing that since this is what she had on the left side.  Past Medical History  Diagnosis Date  . Hypertension   . Hyperlipidemia   . Ruptured disc, cervical   . Cancer   . Breast lump     rt    Past Surgical History  Procedure Date  . Cholecystectomy   . Total knee arthroplasty   . Dilation and curettage of uterus   . Kidney stone surgery   . Breast surgery     left    Family History  Problem Relation Age of Onset  . Hypertension Mother   . Stroke Father     Social History History  Substance Use Topics  . Smoking status: Never Smoker   . Smokeless tobacco: Not on file  . Alcohol Use: No    Allergies  Allergen Reactions  . Aspirin     REACTION: stomach upset  . Codeine Sulfate     REACTION: stomach upset    Current Outpatient Prescriptions  Medication Sig Dispense Refill  . aspirin 81 MG tablet Take 81 mg by mouth daily.        . calcium gluconate 500 MG tablet Take 500 mg by mouth 2 (two) times daily.        . Cholecalciferol (VITAMIN D3) 2000 UNITS TABS Take by mouth daily.        Marland Kitchen ezetimibe (ZETIA) 10 MG tablet Take 10 mg by mouth daily.        .  hydrochlorothiazide 25 MG tablet Take 12.5 mg by mouth daily.        Marland Kitchen LUMIGAN 0.03 % ophthalmic solution Daily.      . mirtazapine (REMERON) 15 MG tablet Daily. 1/2 pill      . Multiple Vitamins-Minerals (OCUVITE PO) Take 2 tablets by mouth daily.        Marland Kitchen omeprazole (PRILOSEC OTC) 20 MG tablet Take 20 mg by mouth daily.        . QUEtiapine (SEROQUEL) 25 MG tablet Take 25 mg by mouth at bedtime.        . tamoxifen (NOLVADEX) 20 MG tablet Daily.      Marland Kitchen TAZORAC 0.05 % cream Daily.      . timolol (TIMOPTIC-XR) 0.5 % ophthalmic gel-forming Daily.        Review of Systems Review of Systems  Constitutional: Negative.   HENT: Negative.   Eyes: Negative.   Respiratory: Negative.   Cardiovascular:       Hypertension  Gastrointestinal: Negative.   Musculoskeletal: Negative.   Skin: Negative.   Neurological: Negative.   Endo/Heme/Allergies: Negative.   Psychiatric/Behavioral:  Negative.     Physical Exam Physical Exam  Constitutional: She is oriented to person, place, and time. She appears well-developed and well-nourished.  HENT:  Head: Normocephalic.  Eyes: Conjunctivae are normal. Pupils are equal, round, and reactive to light.  Neck: Normal range of motion. Neck supple. No tracheal deviation present. No thyromegaly present.  Cardiovascular: Normal rate, regular rhythm and intact distal pulses.  Exam reveals friction rub. Exam reveals no gallop.   No murmur heard. Respiratory: Effort normal and breath sounds normal.    GI: Soft. She exhibits no distension and no mass. There is no tenderness. There is no rebound and no guarding.  Musculoskeletal: Normal range of motion. She exhibits no edema.  Neurological: She is alert and oriented to person, place, and time.  Skin: Skin is warm and dry.  Psychiatric: She has a normal mood and affect. Her behavior is normal. Judgment and thought content normal.     Blood pressure 140/94, pulse 80, temperature 96.1 F (35.6 C), height 5\' 3"  (1.6  m), weight 166 lb 6.4 oz (75.479 kg).   Data reviewed: I have reviewed all office notes. I've also gone over the mammogram reports MRI reports and pathology reports.   Plan: I then she should be a candidate for lumpectomy with attempt to excise both the area of DCIS and LCIS. She will likely need radiation therapy afterwards. I will check with Dr. Kathrynn Running about the potential for MammoSite radiation. I will also check with Dr. Donnie Coffin about the need for genetic counseling. However her family history is relatively negative. Assessment/Plan Impression: New area of DCIS right breast upper outer quadrant was apparently near by LCIS and associated hamartoma.  History of invasive ductal carcinoma left breast stage I receptor positive  Plan: I then she should be a candidate for lumpectomy with attempt to excise both the area of DCIS and LCIS. She will likely need radiation therapy afterwards. I will check with Dr. Kathrynn Running about the potential for MammoSite radiation. I will also check with Dr. Donnie Coffin about the need for genetic counseling. However her family history is relatively negative.  Arren Laminack J 01/29/2011, 10:30 AM

## 2011-01-30 ENCOUNTER — Ambulatory Visit
Admission: RE | Admit: 2011-01-30 | Discharge: 2011-01-30 | Disposition: A | Discharge status: Home / Self Care | Payer: Medicare Other | Source: Ambulatory Visit | Attending: Surgery | Admitting: Surgery

## 2011-01-30 ENCOUNTER — Ambulatory Visit (HOSPITAL_COMMUNITY)
Admission: RE | Admit: 2011-01-30 | Discharge: 2011-01-30 | Disposition: A | Discharge status: Home / Self Care | Payer: Medicare Other | Source: Ambulatory Visit | Attending: Surgery | Admitting: Surgery

## 2011-01-30 ENCOUNTER — Other Ambulatory Visit (INDEPENDENT_AMBULATORY_CARE_PROVIDER_SITE_OTHER): Payer: Self-pay | Admitting: Surgery

## 2011-01-30 DIAGNOSIS — E669 Obesity, unspecified: Secondary | ICD-10-CM | POA: Insufficient documentation

## 2011-01-30 DIAGNOSIS — C50911 Malignant neoplasm of unspecified site of right female breast: Secondary | ICD-10-CM

## 2011-01-30 DIAGNOSIS — D0590 Unspecified type of carcinoma in situ of unspecified breast: Secondary | ICD-10-CM

## 2011-01-30 DIAGNOSIS — Q859 Phakomatosis, unspecified: Secondary | ICD-10-CM | POA: Insufficient documentation

## 2011-01-30 DIAGNOSIS — Z79899 Other long term (current) drug therapy: Secondary | ICD-10-CM | POA: Insufficient documentation

## 2011-01-30 DIAGNOSIS — Z01810 Encounter for preprocedural cardiovascular examination: Secondary | ICD-10-CM | POA: Insufficient documentation

## 2011-01-30 DIAGNOSIS — Z01812 Encounter for preprocedural laboratory examination: Secondary | ICD-10-CM | POA: Insufficient documentation

## 2011-01-30 DIAGNOSIS — Z01818 Encounter for other preprocedural examination: Secondary | ICD-10-CM | POA: Insufficient documentation

## 2011-01-30 DIAGNOSIS — K219 Gastro-esophageal reflux disease without esophagitis: Secondary | ICD-10-CM | POA: Insufficient documentation

## 2011-01-30 DIAGNOSIS — I10 Essential (primary) hypertension: Secondary | ICD-10-CM | POA: Insufficient documentation

## 2011-01-31 ENCOUNTER — Ambulatory Visit
Admit: 2011-01-31 | Discharge: 2011-01-31 | Disposition: A | Discharge status: Home / Self Care | Payer: Medicare Other | Attending: Radiation Oncology | Admitting: Radiation Oncology

## 2011-01-31 DIAGNOSIS — Z9089 Acquired absence of other organs: Secondary | ICD-10-CM | POA: Insufficient documentation

## 2011-01-31 DIAGNOSIS — Z96659 Presence of unspecified artificial knee joint: Secondary | ICD-10-CM | POA: Insufficient documentation

## 2011-01-31 DIAGNOSIS — Z79899 Other long term (current) drug therapy: Secondary | ICD-10-CM | POA: Insufficient documentation

## 2011-01-31 DIAGNOSIS — I10 Essential (primary) hypertension: Secondary | ICD-10-CM | POA: Insufficient documentation

## 2011-01-31 DIAGNOSIS — D0590 Unspecified type of carcinoma in situ of unspecified breast: Secondary | ICD-10-CM | POA: Insufficient documentation

## 2011-01-31 DIAGNOSIS — Z510 Encounter for antineoplastic radiation therapy: Secondary | ICD-10-CM | POA: Insufficient documentation

## 2011-01-31 DIAGNOSIS — Z7982 Long term (current) use of aspirin: Secondary | ICD-10-CM | POA: Insufficient documentation

## 2011-01-31 DIAGNOSIS — E785 Hyperlipidemia, unspecified: Secondary | ICD-10-CM | POA: Insufficient documentation

## 2011-01-31 NOTE — Op Note (Addendum)
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  PROCEDURE:  Needle-guided excision of right breast cancer.  SURGEON:  [name], MD  ANESTHESIA:  General.  CLINICAL HISTORY:  This patient recently had a mammogram showing abnormality in the upper inner right breast and a biopsy was done showing DCIS.  Two other adjacent areas were biopsied one of which was a hamartoma, the other which had some atypical hyperplasia but benign. All three areas were closed together and all had clips placed for localization.  After discussion with the patient and consultation by me with Dr. Kathrynn Running, we elected to proceed with a needle-guided lumpectomy with plans for a possible MammoSite postop radiation since she had already had that on the other side and had done well.  DESCRIPTION OF PROCEDURE:  I saw the patient in the holding area and she had no further questions.  I reviewed the plans and initialed the right breast as the operative side.  I reviewed the localizing films and 2 guidewires were placed one of which entered about a fingerbreadth above the areolar margin in the 12 o'clock position.  This was localized with 2 clips associated with the hamartoma and the atypical hyperplasia.  A second clip was about a fingerbreadth superior and a fingerbreadth medial to the first and localized the area of DCIS.  The patient was taken to the  operating room and after satisfactory general anesthesia had been obtained the time-out was done and the breast prepped and draped.  I made a curvilinear incision between the two guidewires in the 12 o'clock position.  I went about a centimeter deep into the breast tissue and then raised a superior flap until I was above the more superior of the two guidewires and since the guidewire was traveling from superior to inferior.  Once I was about a centimeter above the guidewire, I went ahead and divided the breast tissue down to the chest wall.  I then came around medial to that down to the chest wall.  I then raised a skin flap inferiorly and mobilized the more inferior of the two wires into the wound and began dividing the breast tissue along the inferior margin down to the chest wall.  Here, we had some fairly thickened material which I thought was fibrocystic and subareolar tissue.  I did not see anything that looked like  carcinoma.  Finally, the lateral aspect was removed and divided.  The more inferior clip had flipped into the muscle and as I was trying to remove the specimen snag on the muscle and was pulled through and out of the specimen but I had been around it completely.  I spent several minutes making  sure everything was dry.  I put 20 mL of 0.25% plain Marcaine in.  Specimen mammogram showed what looked like the clip localizing the cancer and fairly close to the center of the specimen with two other clips near 1 edge but this was the edge by the areola and subareolar tissue and I did not think I will be taking any more tissue there without taking the areola.  I went ahead and made a stab wound from the MammoSite and placed it into the cavity.  I closed the incision in layers with 3-0 Vicryl and 4-0 Monocryl subcuticular.  I inflated the MammoSite to 50 mL which she tolerated well, did not appear to be too tight.  Sterile dressings were applied.  The patient was awakened  and will be taken to the recovery room.  There were no operative complications.  All counts were correct.     [name], M.D.     CJS/MEDQ  D:  01/30/2011  T:  01/30/2011  Job:  [id number]  cc:   Dr. Everette Rank, M.D.  Electronically Signed by Cyndia Bent M.D. on 02/12/2011 07:52:32 AM

## 2011-02-01 ENCOUNTER — Encounter (INDEPENDENT_AMBULATORY_CARE_PROVIDER_SITE_OTHER): Payer: Medicare Other | Admitting: Surgery

## 2011-02-04 ENCOUNTER — Encounter (INDEPENDENT_AMBULATORY_CARE_PROVIDER_SITE_OTHER): Payer: Medicare Other

## 2011-02-04 ENCOUNTER — Ambulatory Visit (INDEPENDENT_AMBULATORY_CARE_PROVIDER_SITE_OTHER): Payer: Medicare Other | Admitting: Surgery

## 2011-02-04 DIAGNOSIS — D0510 Intraductal carcinoma in situ of unspecified breast: Secondary | ICD-10-CM

## 2011-02-04 DIAGNOSIS — D0590 Unspecified type of carcinoma in situ of unspecified breast: Secondary | ICD-10-CM

## 2011-02-04 NOTE — Patient Instructions (Signed)
Call the office with any problems or questions

## 2011-02-04 NOTE — Progress Notes (Signed)
The patient presents for Mammosite placement. She has a CED in place. Her path shows low grade DCIS snd margins OK. Reviewed with Dr Kathrynn Running and OK to proceed.  Plans were discussed with the patient and she wanted to proceed as planned.  The time out was done. The area of the breast was prepped with alcohol and draped. The Mammosite was tested and was symmetric. The CED was deflated and removed and the mammosite placed easily. In was inflated to 50cc. Sono showed no problems and good skin to Mammosite distance.  Sterile dressing was applied.  She will be seen tomorrow by radiation therapy.  She will come back here in about three weeks.

## 2011-02-07 ENCOUNTER — Other Ambulatory Visit: Payer: Self-pay | Admitting: Oncology

## 2011-02-07 ENCOUNTER — Encounter (HOSPITAL_BASED_OUTPATIENT_CLINIC_OR_DEPARTMENT_OTHER): Payer: Medicare Other | Admitting: Oncology

## 2011-02-07 DIAGNOSIS — M949 Disorder of cartilage, unspecified: Secondary | ICD-10-CM

## 2011-02-07 DIAGNOSIS — C50419 Malignant neoplasm of upper-outer quadrant of unspecified female breast: Secondary | ICD-10-CM

## 2011-02-07 DIAGNOSIS — M899 Disorder of bone, unspecified: Secondary | ICD-10-CM

## 2011-02-07 LAB — COMPREHENSIVE METABOLIC PANEL
ALT: 13 U/L (ref 0–35)
AST: 15 U/L (ref 0–37)
Albumin: 3.8 g/dL (ref 3.5–5.2)
Alkaline Phosphatase: 71 U/L (ref 39–117)
BUN: 22 mg/dL (ref 6–23)
CO2: 25 mEq/L (ref 19–32)
Calcium: 9 mg/dL (ref 8.4–10.5)
Chloride: 108 mEq/L (ref 96–112)
Creatinine, Ser: 1.19 mg/dL — ABNORMAL HIGH (ref 0.50–1.10)
Glucose, Bld: 118 mg/dL — ABNORMAL HIGH (ref 70–99)
Potassium: 3.7 mEq/L (ref 3.5–5.3)
Sodium: 140 mEq/L (ref 135–145)
Total Bilirubin: 0.3 mg/dL (ref 0.3–1.2)
Total Protein: 6.3 g/dL (ref 6.0–8.3)

## 2011-02-07 LAB — CBC WITH DIFFERENTIAL/PLATELET
BASO%: 0.2 % (ref 0.0–2.0)
Basophils Absolute: 0 10*3/uL (ref 0.0–0.1)
EOS%: 3.3 % (ref 0.0–7.0)
Eosinophils Absolute: 0.3 10*3/uL (ref 0.0–0.5)
HCT: 37 % (ref 34.8–46.6)
HGB: 12.7 g/dL (ref 11.6–15.9)
LYMPH%: 25.5 % (ref 14.0–49.7)
MCH: 31.2 pg (ref 25.1–34.0)
MCHC: 34.4 g/dL (ref 31.5–36.0)
MCV: 90.6 fL (ref 79.5–101.0)
MONO#: 0.4 10*3/uL (ref 0.1–0.9)
MONO%: 5 % (ref 0.0–14.0)
NEUT#: 5.4 10*3/uL (ref 1.5–6.5)
NEUT%: 66 % (ref 38.4–76.8)
Platelets: 170 10*3/uL (ref 145–400)
RBC: 4.09 10*6/uL (ref 3.70–5.45)
RDW: 13.1 % (ref 11.2–14.5)
WBC: 8.2 10*3/uL (ref 3.9–10.3)
lymph#: 2.1 10*3/uL (ref 0.9–3.3)

## 2011-02-07 LAB — VITAMIN D 25 HYDROXY (VIT D DEFICIENCY, FRACTURES): Vit D, 25-Hydroxy: 47 ng/mL (ref 30–89)

## 2011-02-07 LAB — CANCER ANTIGEN 27.29: CA 27.29: 13 U/mL (ref 0–39)

## 2011-02-14 ENCOUNTER — Encounter: Payer: Medicare Other | Admitting: Oncology

## 2011-02-18 ENCOUNTER — Other Ambulatory Visit: Payer: Self-pay | Admitting: Oncology

## 2011-02-21 ENCOUNTER — Encounter (INDEPENDENT_AMBULATORY_CARE_PROVIDER_SITE_OTHER): Payer: Self-pay | Admitting: Surgery

## 2011-03-08 ENCOUNTER — Ambulatory Visit (INDEPENDENT_AMBULATORY_CARE_PROVIDER_SITE_OTHER): Payer: Medicare Other | Admitting: Surgery

## 2011-03-08 ENCOUNTER — Encounter (INDEPENDENT_AMBULATORY_CARE_PROVIDER_SITE_OTHER): Payer: Self-pay | Admitting: Surgery

## 2011-03-08 VITALS — BP 152/98 | HR 72

## 2011-03-08 DIAGNOSIS — Z9889 Other specified postprocedural states: Secondary | ICD-10-CM

## 2011-03-08 NOTE — Progress Notes (Signed)
NAME: Jacqueline [name]                                            DOB: 1935-05-08 DATE: 03/08/2011                                                  MRN: [id number]  CC: Post op  HPI: This patient comes in for post op follow-up.Sheunderwent Right lumpectomy and mammosite on 7/31. She feels that she is doing well. She is concerned that there is still a little swelling and redness at the lumpectomy site  PE: General: The patient appears to be healthy, NAD  Breast: The incision is nicely healed and no infection. I suspect there is a small seroma. There is some edema and redness of the top half of the areola, typical for radiation changes  DATA REVIEWED: Oncology notes  IMPRESSION: The patient is doing well S/P Lumpecomy.    PLAN: Will see pack in six months

## 2011-03-14 ENCOUNTER — Ambulatory Visit
Admission: RE | Admit: 2011-03-14 | Discharge: 2011-03-14 | Disposition: A | Discharge status: Home / Self Care | Payer: Medicare Other | Source: Ambulatory Visit | Attending: Radiation Oncology | Admitting: Radiation Oncology

## 2011-03-25 ENCOUNTER — Telehealth: Payer: Self-pay | Admitting: Family Medicine

## 2011-03-25 MED ORDER — QUETIAPINE FUMARATE 25 MG PO TABS: 25.0000 mg | ORAL_TABLET | Freq: Every day | ORAL | Status: DC

## 2011-03-25 MED ORDER — MIRTAZAPINE 15 MG PO TABS: 15.0000 mg | ORAL_TABLET | Freq: Every day | ORAL | Status: DC

## 2011-03-25 NOTE — Telephone Encounter (Signed)
Patient had no success in weaning off both the Mirtazapine and Quetiapine. However her rx only calls for every other night. But she needs to be back on them every other night until her appt on 05-16-2011. Please call in new rxs for 1 nightly each to [facility].

## 2011-04-05 LAB — URINALYSIS, ROUTINE W REFLEX MICROSCOPIC
Bilirubin Urine: NEGATIVE
Glucose, UA: NEGATIVE mg/dL
Hgb urine dipstick: NEGATIVE
Ketones, ur: NEGATIVE mg/dL
Nitrite: NEGATIVE
Protein, ur: NEGATIVE mg/dL
Specific Gravity, Urine: 1.018 (ref 1.005–1.030)
Urobilinogen, UA: 0.2 mg/dL (ref 0.0–1.0)
pH: 5.5 (ref 5.0–8.0)

## 2011-04-05 LAB — COMPREHENSIVE METABOLIC PANEL
ALT: 25 U/L (ref 0–35)
AST: 29 U/L (ref 0–37)
Albumin: 3.7 g/dL (ref 3.5–5.2)
Alkaline Phosphatase: 65 U/L (ref 39–117)
BUN: 15 mg/dL (ref 6–23)
CO2: 27 mEq/L (ref 19–32)
Calcium: 9.3 mg/dL (ref 8.4–10.5)
Chloride: 111 mEq/L (ref 96–112)
Creatinine, Ser: 1.15 mg/dL (ref 0.4–1.2)
GFR calc Af Amer: 56 mL/min — ABNORMAL LOW (ref >60)
GFR calc non Af Amer: 47 mL/min — ABNORMAL LOW (ref >60)
Glucose, Bld: 105 mg/dL — ABNORMAL HIGH (ref 70–99)
Potassium: 3.6 mEq/L (ref 3.5–5.1)
Sodium: 141 mEq/L (ref 135–145)
Total Bilirubin: 0.3 mg/dL (ref 0.3–1.2)
Total Protein: 6.5 g/dL (ref 6.0–8.3)

## 2011-04-05 LAB — URINE CULTURE
Colony Count: 30000
Colony Count: NO GROWTH
Culture: NO GROWTH

## 2011-04-05 LAB — CBC
HCT: 27.8 % — ABNORMAL LOW (ref 36.0–46.0)
HCT: 29.5 % — ABNORMAL LOW (ref 36.0–46.0)
HCT: 37.7 % (ref 36.0–46.0)
Hemoglobin: 10 g/dL — ABNORMAL LOW (ref 12.0–15.0)
Hemoglobin: 13 g/dL (ref 12.0–15.0)
Hemoglobin: 9.5 g/dL — ABNORMAL LOW (ref 12.0–15.0)
MCHC: 33.8 g/dL (ref 30.0–36.0)
MCHC: 34.2 g/dL (ref 30.0–36.0)
MCHC: 34.6 g/dL (ref 30.0–36.0)
MCV: 92 fL (ref 78.0–100.0)
MCV: 93.5 fL (ref 78.0–100.0)
MCV: 94 fL (ref 78.0–100.0)
Platelets: 126 10*3/uL — ABNORMAL LOW (ref 150–400)
Platelets: 143 10*3/uL — ABNORMAL LOW (ref 150–400)
Platelets: 204 10*3/uL (ref 150–400)
RBC: 2.97 MIL/uL — ABNORMAL LOW (ref 3.87–5.11)
RBC: 3.14 MIL/uL — ABNORMAL LOW (ref 3.87–5.11)
RBC: 4.1 MIL/uL (ref 3.87–5.11)
RDW: 12.3 % (ref 11.5–15.5)
RDW: 12.5 % (ref 11.5–15.5)
RDW: 12.5 % (ref 11.5–15.5)
WBC: 11.4 10*3/uL — ABNORMAL HIGH (ref 4.0–10.5)
WBC: 12.9 10*3/uL — ABNORMAL HIGH (ref 4.0–10.5)
WBC: 9 10*3/uL (ref 4.0–10.5)

## 2011-04-05 LAB — URINALYSIS, MICROSCOPIC ONLY
Bilirubin Urine: NEGATIVE
Glucose, UA: NEGATIVE mg/dL
Hgb urine dipstick: NEGATIVE
Ketones, ur: NEGATIVE mg/dL
Leukocytes, UA: NEGATIVE
Nitrite: NEGATIVE
Protein, ur: NEGATIVE mg/dL
Specific Gravity, Urine: 1.026 (ref 1.005–1.030)
Urobilinogen, UA: 0.2 mg/dL (ref 0.0–1.0)
pH: 5.5 (ref 5.0–8.0)

## 2011-04-05 LAB — BASIC METABOLIC PANEL
BUN: 10 mg/dL (ref 6–23)
BUN: 11 mg/dL (ref 6–23)
CO2: 24 mEq/L (ref 19–32)
CO2: 28 mEq/L (ref 19–32)
Calcium: 8.5 mg/dL (ref 8.4–10.5)
Calcium: 9 mg/dL (ref 8.4–10.5)
Chloride: 101 mEq/L (ref 96–112)
Chloride: 110 mEq/L (ref 96–112)
Creatinine, Ser: 1.02 mg/dL (ref 0.4–1.2)
Creatinine, Ser: 1.21 mg/dL — ABNORMAL HIGH (ref 0.4–1.2)
GFR calc Af Amer: 53 mL/min — ABNORMAL LOW (ref >60)
GFR calc Af Amer: >60 mL/min (ref >60)
GFR calc non Af Amer: 44 mL/min — ABNORMAL LOW (ref >60)
GFR calc non Af Amer: 53 mL/min — ABNORMAL LOW (ref >60)
Glucose, Bld: 114 mg/dL — ABNORMAL HIGH (ref 70–99)
Glucose, Bld: 117 mg/dL — ABNORMAL HIGH (ref 70–99)
Potassium: 3.6 mEq/L (ref 3.5–5.1)
Potassium: 4.1 mEq/L (ref 3.5–5.1)
Sodium: 134 mEq/L — ABNORMAL LOW (ref 135–145)
Sodium: 144 mEq/L (ref 135–145)

## 2011-04-05 LAB — DIFFERENTIAL
Basophils Absolute: 0 10*3/uL (ref 0.0–0.1)
Basophils Relative: 1 % (ref 0–1)
Eosinophils Absolute: 0.1 10*3/uL (ref 0.0–0.7)
Eosinophils Relative: 1 % (ref 0–5)
Lymphocytes Relative: 30 % (ref 12–46)
Lymphs Abs: 2.7 10*3/uL (ref 0.7–4.0)
Monocytes Absolute: 0.5 10*3/uL (ref 0.1–1.0)
Monocytes Relative: 6 % (ref 3–12)
Neutro Abs: 5.6 10*3/uL (ref 1.7–7.7)
Neutrophils Relative %: 62 % (ref 43–77)

## 2011-04-05 LAB — URINE MICROSCOPIC-ADD ON

## 2011-04-05 LAB — PROTIME-INR
INR: 0.9 (ref 0.00–1.49)
INR: 1.1 (ref 0.00–1.49)
INR: 1.9 — ABNORMAL HIGH (ref 0.00–1.49)
Prothrombin Time: 12.7 seconds (ref 11.6–15.2)
Prothrombin Time: 14.9 seconds (ref 11.6–15.2)
Prothrombin Time: 23.3 seconds — ABNORMAL HIGH (ref 11.6–15.2)

## 2011-04-05 LAB — ABO/RH: ABO/RH(D): O POS

## 2011-04-05 LAB — APTT: aPTT: 25 seconds (ref 24–37)

## 2011-04-05 LAB — TYPE AND SCREEN
ABO/RH(D): O POS
Antibody Screen: NEGATIVE

## 2011-05-01 ENCOUNTER — Other Ambulatory Visit (INDEPENDENT_AMBULATORY_CARE_PROVIDER_SITE_OTHER): Payer: Medicare Other

## 2011-05-01 DIAGNOSIS — Z0000 Encounter for general adult medical examination without abnormal findings: Secondary | ICD-10-CM

## 2011-05-01 LAB — CBC WITH DIFFERENTIAL/PLATELET
Basophils Absolute: 0 10*3/uL (ref 0.0–0.1)
Basophils Relative: 0.3 % (ref 0.0–3.0)
Eosinophils Absolute: 0.1 10*3/uL (ref 0.0–0.7)
Eosinophils Relative: 1.2 % (ref 0.0–5.0)
HCT: 39.1 % (ref 36.0–46.0)
Hemoglobin: 13.2 g/dL (ref 12.0–15.0)
Lymphocytes Relative: 26.8 % (ref 12.0–46.0)
Lymphs Abs: 2.3 10*3/uL (ref 0.7–4.0)
MCHC: 33.6 g/dL (ref 30.0–36.0)
MCV: 92.5 fl (ref 78.0–100.0)
Monocytes Absolute: 0.6 10*3/uL (ref 0.1–1.0)
Monocytes Relative: 6.5 % (ref 3.0–12.0)
Neutro Abs: 5.6 10*3/uL (ref 1.4–7.7)
Neutrophils Relative %: 65.2 % (ref 43.0–77.0)
Platelets: 193 10*3/uL (ref 150.0–400.0)
RBC: 4.23 Mil/uL (ref 3.87–5.11)
RDW: 13.1 % (ref 11.5–14.6)
WBC: 8.6 10*3/uL (ref 4.5–10.5)

## 2011-05-01 LAB — POCT URINALYSIS DIPSTICK
Bilirubin, UA: NEGATIVE
Blood, UA: NEGATIVE
Glucose, UA: NEGATIVE
Ketones, UA: NEGATIVE
Nitrite, UA: POSITIVE
Spec Grav, UA: 1.025
Urobilinogen, UA: 0.2
pH, UA: 6.5

## 2011-05-01 LAB — TSH: TSH: 1.71 u[IU]/mL (ref 0.35–5.50)

## 2011-05-01 LAB — BASIC METABOLIC PANEL
BUN: 18 mg/dL (ref 6–23)
CO2: 23 mEq/L (ref 19–32)
Calcium: 9.1 mg/dL (ref 8.4–10.5)
Chloride: 106 mEq/L (ref 96–112)
Creatinine, Ser: 1.4 mg/dL — ABNORMAL HIGH (ref 0.4–1.2)
GFR: 37.78 mL/min — ABNORMAL LOW (ref >60.00)
Glucose, Bld: 128 mg/dL — ABNORMAL HIGH (ref 70–99)
Potassium: 3.5 mEq/L (ref 3.5–5.1)
Sodium: 139 mEq/L (ref 135–145)

## 2011-05-01 LAB — HEPATIC FUNCTION PANEL
ALT: 21 U/L (ref 0–35)
AST: 19 U/L (ref 0–37)
Albumin: 3.7 g/dL (ref 3.5–5.2)
Alkaline Phosphatase: 89 U/L (ref 39–117)
Bilirubin, Direct: 0.1 mg/dL (ref 0.0–0.3)
Total Bilirubin: 0.4 mg/dL (ref 0.3–1.2)
Total Protein: 7.4 g/dL (ref 6.0–8.3)

## 2011-05-01 LAB — LIPID PANEL
Cholesterol: 190 mg/dL (ref 0–200)
HDL: 46.6 mg/dL (ref >39.00)
LDL Cholesterol: 111 mg/dL — ABNORMAL HIGH (ref 0–99)
Total CHOL/HDL Ratio: 4
Triglycerides: 161 mg/dL — ABNORMAL HIGH (ref 0.0–149.0)
VLDL: 32.2 mg/dL (ref 0.0–40.0)

## 2011-05-16 ENCOUNTER — Encounter: Payer: Medicare Other | Admitting: Family Medicine

## 2011-05-20 ENCOUNTER — Other Ambulatory Visit: Payer: Self-pay | Admitting: Family Medicine

## 2011-05-20 MED ORDER — TRIAMTERENE-HCTZ 37.5-25 MG PO TABS: 1.0000 | ORAL_TABLET | Freq: Every day | ORAL | Status: DC

## 2011-05-20 NOTE — Telephone Encounter (Signed)
Pt is req to get refill of Triamt/HCTZ 25 mg to [facility] [place] [phone]

## 2011-06-06 ENCOUNTER — Encounter: Payer: Self-pay | Admitting: Family Medicine

## 2011-06-06 ENCOUNTER — Ambulatory Visit (INDEPENDENT_AMBULATORY_CARE_PROVIDER_SITE_OTHER): Payer: Medicare Other | Admitting: Family Medicine

## 2011-06-06 VITALS — BP 118/74 | Temp 98.0°F | Ht 62.75 in | Wt 168.0 lb

## 2011-06-06 DIAGNOSIS — Z0000 Encounter for general adult medical examination without abnormal findings: Secondary | ICD-10-CM

## 2011-06-06 DIAGNOSIS — Z23 Encounter for immunization: Secondary | ICD-10-CM

## 2011-06-06 DIAGNOSIS — E785 Hyperlipidemia, unspecified: Secondary | ICD-10-CM

## 2011-06-06 DIAGNOSIS — M899 Disorder of bone, unspecified: Secondary | ICD-10-CM

## 2011-06-06 DIAGNOSIS — M949 Disorder of cartilage, unspecified: Secondary | ICD-10-CM

## 2011-06-06 DIAGNOSIS — IMO0002 Reserved for concepts with insufficient information to code with codable children: Secondary | ICD-10-CM

## 2011-06-06 DIAGNOSIS — I10 Essential (primary) hypertension: Secondary | ICD-10-CM

## 2011-06-06 DIAGNOSIS — Z853 Personal history of malignant neoplasm of breast: Secondary | ICD-10-CM

## 2011-06-06 DIAGNOSIS — K219 Gastro-esophageal reflux disease without esophagitis: Secondary | ICD-10-CM

## 2011-06-06 DIAGNOSIS — D0510 Intraductal carcinoma in situ of unspecified breast: Secondary | ICD-10-CM

## 2011-06-06 MED ORDER — QUETIAPINE FUMARATE 25 MG PO TABS: 25.0000 mg | ORAL_TABLET | Freq: Every day | ORAL | Status: DC

## 2011-06-06 MED ORDER — EZETIMIBE 10 MG PO TABS: 10.0000 mg | ORAL_TABLET | Freq: Every day | ORAL | Status: DC

## 2011-06-06 MED ORDER — MIRTAZAPINE 15 MG PO TABS: 15.0000 mg | ORAL_TABLET | Freq: Every day | ORAL | Status: DC

## 2011-06-06 NOTE — Progress Notes (Signed)
  Subjective:    Patient ID: Jacqueline [name], female    DOB: Feb 08, 1935, 76 y.o.   MRN: [id number]  HPI Jacqueline [name] is a 76 year old, married female, nonsmoker, who comes in today for a Medicare wellness examination because of a history of osteopenia, hyperlipidemia, hypertension, reflux, esophagitis, sleep dysfunction, recent breast cancer, glaucoma.  Her medical history reviewed in detail, including her medications, see the med list.  She gets routine eye care, hearing normal, regular dental care, BSE monthly, and a mammography, colonoscopy, normal, tetanus, 2004, Pneumovax, x 2, shingles 2005,  Her cognitive function is normal.  She and her husband both manage their financial affairs together.  She walks on a daily basis.  Home health safety reviewed.  No issues identified, no guns in the house, she does have a healthcare power of attorney and living will   Review of Systems  Constitutional: Negative.   HENT: Negative.   Eyes: Negative.   Respiratory: Negative.   Cardiovascular: Negative.   Gastrointestinal: Negative.   Genitourinary: Negative.   Musculoskeletal: Negative.   Neurological: Negative.   Hematological: Negative.   Psychiatric/Behavioral: Negative.        Objective:   Physical Exam  Constitutional: She appears well-developed and well-nourished.  HENT:  Head: Normocephalic and atraumatic.  Right Ear: External ear normal.  Left Ear: External ear normal.  Nose: Nose normal.  Mouth/Throat: Oropharynx is clear and moist.  Eyes: EOM are normal. Pupils are equal, round, and reactive to light.  Neck: Normal range of motion. Neck supple. No thyromegaly present.  Cardiovascular: Normal rate, regular rhythm, normal heart sounds and intact distal pulses.  Exam reveals no gallop and no friction rub.   No murmur heard. Pulmonary/Chest: Effort normal and breath sounds normal.  Abdominal: Soft. Bowel sounds are normal. She exhibits no distension and no mass. There is no  tenderness. There is no rebound.  Genitourinary:       Bilateral breast exam shows scar in the right breast 12 o'clock position from previous lumpectomy and postop radiation with the seroma.  Left breast also scar from previous lumpectomy and scar in the axillary area from previous lymph node extraction.  Musculoskeletal: Normal range of motion.  Lymphadenopathy:    She has no cervical adenopathy.  Neurological: She is alert. She has normal reflexes. No cranial nerve deficit. She exhibits normal muscle tone. Coordination normal.  Skin: Skin is warm and dry.  Psychiatric: She has a normal mood and affect. Her behavior is normal. Judgment and thought content normal.          Assessment & Plan:  Healthy female.  Osteopenia.  Continue calcium, vitamin D, and walking.  Hyperlipidemia.  Continue Zetia.  Her heart size distal .5 mg daily, however, BP is dropped to 117/72.  Therefore, stopped, blood pressure medication.  Sleep dysfunction.  Continue Remeron and Seroquel nightly  Reflux esophagitis.  Continue Prilosec 20 mg daily.  Glaucoma followed by ophthalmologist.  Return in one year, sooner if any problems.  Recent history of bilateral breast cancer with bilateral lumpectomies and postop radiation followed by Dr. Donnie Coffin and oncology and Dr. Jamey Ripa  general surgeon

## 2011-06-06 NOTE — Patient Instructions (Signed)
Stop the hydrochlorothiazide, and check your blood pressure daily in the morning for one month.  Goal 135/85 or less.  Continue your other medications.  Follow-up in one year sooner if any problems

## 2011-07-20 ENCOUNTER — Telehealth: Payer: Self-pay | Admitting: Oncology

## 2011-07-20 NOTE — Telephone Encounter (Signed)
Converted 2/12 appt to 3/8. lmonvm informing pt. Schedule mailed.

## 2011-07-30 ENCOUNTER — Ambulatory Visit (INDEPENDENT_AMBULATORY_CARE_PROVIDER_SITE_OTHER): Payer: Medicare Other | Admitting: Surgery

## 2011-07-30 ENCOUNTER — Encounter (INDEPENDENT_AMBULATORY_CARE_PROVIDER_SITE_OTHER): Payer: Self-pay | Admitting: Surgery

## 2011-07-30 ENCOUNTER — Other Ambulatory Visit (INDEPENDENT_AMBULATORY_CARE_PROVIDER_SITE_OTHER): Payer: Self-pay | Admitting: Surgery

## 2011-07-30 VITALS — BP 130/76 | HR 68 | Temp 97.6°F | Resp 16 | Ht 63.0 in | Wt 165.0 lb

## 2011-07-30 DIAGNOSIS — N61 Mastitis without abscess: Secondary | ICD-10-CM

## 2011-07-30 DIAGNOSIS — N611 Abscess of the breast and nipple: Secondary | ICD-10-CM | POA: Insufficient documentation

## 2011-07-30 MED ORDER — CEPHALEXIN 500 MG PO CAPS: 500.0000 mg | ORAL_CAPSULE | Freq: Four times a day (QID) | ORAL | Status: DC

## 2011-07-30 NOTE — Progress Notes (Signed)
Addended byLiliana Cline on: 07/30/2011 03:36 PM   Modules accepted: Orders

## 2011-07-30 NOTE — Patient Instructions (Signed)
Change the outer dressing if needed. Do not shower. We will have you come tomorrow for the nurse to change the dressing

## 2011-07-30 NOTE — Progress Notes (Signed)
Chief complaint: Breast red and draining at surgical site  History of present illness: This patient underwent bilateral lumpectomies with bilateral MammoSite radiation several months ago. She has been doing fine until a few days ago. She developed, basically overnight, swelling of the right breast with some discomfort. This developed a "knot" there. She's had a little bit of drainage but hasn't seen an open area either at the nipple or along the scar which is in the 12:00 position.  Past history, family history, and review of systems are noted in the electronic medical record.  PE: Vital signs: Gen.: The patient is alert awake and in no distress. Breasts: Left breast looks normal. Polypectomy site is soft and well healed. The right breast shows a large area of erythema and swelling which feels fluctuant. It extends from the 12:00 position scar down to the areolar margin 12:00 position. It is mildly tender. The remainder of the breast is normal.  Impression: Apparent breast abscess developing late after MammoSite radiation for breast cancer.  Plan: I recommended that we open the area and obtain a culture and start her on antibiotics. She is willing to proceed.  Procedure note: I anesthetized the area of the abscess with 1% Xylocaine with epinephrine. I had excellent anesthesia. The area was then prepped and opened the old scar. I got into the cavity which contained thin cloudy yellow fluid. Culture was taken. I drain all the fluid out as best I could and packed with a 4 x 4. She tolerated this well with minimal discomfort.  We will start her on Keflex 500 q.i.d. She'll come back tomorrow for a dressing change.

## 2011-07-31 ENCOUNTER — Ambulatory Visit (INDEPENDENT_AMBULATORY_CARE_PROVIDER_SITE_OTHER): Payer: Medicare Other | Admitting: General Surgery

## 2011-07-31 DIAGNOSIS — Z4800 Encounter for change or removal of nonsurgical wound dressing: Secondary | ICD-10-CM

## 2011-07-31 DIAGNOSIS — N611 Abscess of the breast and nipple: Secondary | ICD-10-CM

## 2011-07-31 DIAGNOSIS — N61 Mastitis without abscess: Secondary | ICD-10-CM

## 2011-07-31 MED ORDER — CEPHALEXIN 500 MG PO CAPS: 500.0000 mg | ORAL_CAPSULE | Freq: Four times a day (QID) | ORAL | Status: AC

## 2011-07-31 NOTE — Progress Notes (Signed)
Patient comes in status post drainage of right breast abscess, status post mammosite radiation. Patient's wound was unpacked. Wound looks good. No redness or warmth. Wound was re-packed with sterile moist 4x4 gauze and covered with a dry gauze. Patient used her bra to hold in place, because she states tape pulls at her skin. I instructed her husband on dressing changes. He believes he in unable to do this. I will order home health for patient. She will call us if she doesn't hear from home health care tomorrow and follow up with Dr Jamey Ripa next week.

## 2011-07-31 NOTE — Patient Instructions (Signed)
Continue dressing changes daily. Call with any worsening symptoms and return to see Dr Jamey Ripa next week.

## 2011-08-01 DIAGNOSIS — Z853 Personal history of malignant neoplasm of breast: Secondary | ICD-10-CM | POA: Diagnosis not present

## 2011-08-01 DIAGNOSIS — T8140XA Infection following a procedure, unspecified, initial encounter: Secondary | ICD-10-CM | POA: Diagnosis not present

## 2011-08-02 DIAGNOSIS — T8140XA Infection following a procedure, unspecified, initial encounter: Secondary | ICD-10-CM | POA: Diagnosis not present

## 2011-08-02 DIAGNOSIS — Z853 Personal history of malignant neoplasm of breast: Secondary | ICD-10-CM | POA: Diagnosis not present

## 2011-08-02 LAB — WOUND CULTURE: Gram Stain: NONE SEEN

## 2011-08-07 ENCOUNTER — Ambulatory Visit (INDEPENDENT_AMBULATORY_CARE_PROVIDER_SITE_OTHER): Payer: Medicare Other | Admitting: Surgery

## 2011-08-07 ENCOUNTER — Encounter (INDEPENDENT_AMBULATORY_CARE_PROVIDER_SITE_OTHER): Payer: Self-pay | Admitting: Surgery

## 2011-08-07 VITALS — BP 124/82 | HR 88 | Temp 97.8°F | Resp 18 | Ht 63.0 in | Wt 165.0 lb

## 2011-08-07 DIAGNOSIS — N61 Mastitis without abscess: Secondary | ICD-10-CM

## 2011-08-07 DIAGNOSIS — N611 Abscess of the breast and nipple: Secondary | ICD-10-CM

## 2011-08-07 NOTE — Patient Instructions (Signed)
Continue packing the wound. Be sure that packing gets all the way to the bottom of the abscess cavity. Come back next week for my nurse to check the incision. I will see you in about two weeks.

## 2011-08-07 NOTE — Progress Notes (Signed)
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 She had a prior lumpectomy and was treated with a MammoSite for radiation. I thought she was developing infection and the seroma cavity and opened it under local anesthesia. The fluid was clear and the culture showed only a few colonies of coagulase-negative staph.  She's been having home health care and/or her husband due to packing. She feels like she is much better.  She has developed a loop of nausea and diarrhea which he thinks are from the antibiotics. She's not had any fever.  Exam: Vital signs: Breast: The wound is smaller by about 50%. There is a little bit of purulent drainage. It could be readily packed still.  Data review: Culture as noted above  Impression: Resolving early infection of seroma cavity.  Plan: Continue local wound care. She'll come back for a nurse check in a week and see me in two weeks. We'll stop the antibiotics. If the diarrhea and nausea continue she will call me.

## 2011-08-09 DIAGNOSIS — Z853 Personal history of malignant neoplasm of breast: Secondary | ICD-10-CM | POA: Diagnosis not present

## 2011-08-09 DIAGNOSIS — T8140XA Infection following a procedure, unspecified, initial encounter: Secondary | ICD-10-CM | POA: Diagnosis not present

## 2011-08-10 ENCOUNTER — Other Ambulatory Visit: Payer: Self-pay | Admitting: Family Medicine

## 2011-08-12 ENCOUNTER — Telehealth (INDEPENDENT_AMBULATORY_CARE_PROVIDER_SITE_OTHER): Payer: Self-pay | Admitting: General Surgery

## 2011-08-12 DIAGNOSIS — T8140XA Infection following a procedure, unspecified, initial encounter: Secondary | ICD-10-CM | POA: Diagnosis not present

## 2011-08-12 DIAGNOSIS — Z853 Personal history of malignant neoplasm of breast: Secondary | ICD-10-CM | POA: Diagnosis not present

## 2011-08-12 NOTE — Telephone Encounter (Signed)
Just continue local wet to dry dressing changes.

## 2011-08-12 NOTE — Telephone Encounter (Signed)
Called patient back, let her know to continue dressing wound like we have been. She states it has some firm tissue around wound, I told her that was normal as well. I spoke with Annice Pih, the home care nurse, and approved continued visits to the home. She will call with any other problems.

## 2011-08-12 NOTE — Telephone Encounter (Signed)
Patient called re: right breast wound. Patient states she is now having bright green drainage from wound x 2 days. No other changes to wound or symptoms. Please advise.

## 2011-08-14 ENCOUNTER — Ambulatory Visit (INDEPENDENT_AMBULATORY_CARE_PROVIDER_SITE_OTHER): Payer: Medicare Other | Admitting: General Surgery

## 2011-08-14 DIAGNOSIS — Z4800 Encounter for change or removal of nonsurgical wound dressing: Secondary | ICD-10-CM

## 2011-08-15 NOTE — Progress Notes (Signed)
Packing removed, area appears to be healing, pt admits that area is requiring less packing, states her husband is packing it daily. Area repacked with guaze packing with saline.

## 2011-08-20 DIAGNOSIS — Z853 Personal history of malignant neoplasm of breast: Secondary | ICD-10-CM | POA: Diagnosis not present

## 2011-08-20 DIAGNOSIS — T8140XA Infection following a procedure, unspecified, initial encounter: Secondary | ICD-10-CM | POA: Diagnosis not present

## 2011-08-23 ENCOUNTER — Encounter (INDEPENDENT_AMBULATORY_CARE_PROVIDER_SITE_OTHER): Payer: Self-pay | Admitting: Surgery

## 2011-08-23 ENCOUNTER — Ambulatory Visit (INDEPENDENT_AMBULATORY_CARE_PROVIDER_SITE_OTHER): Payer: Medicare Other | Admitting: Surgery

## 2011-08-23 VITALS — BP 148/100 | HR 76 | Temp 97.6°F | Resp 16 | Ht 63.0 in | Wt 165.0 lb

## 2011-08-23 DIAGNOSIS — N611 Abscess of the breast and nipple: Secondary | ICD-10-CM

## 2011-08-23 DIAGNOSIS — N61 Mastitis without abscess: Secondary | ICD-10-CM

## 2011-08-23 NOTE — Progress Notes (Signed)
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 She had a prior lumpectomy and was treated with a MammoSite for radiation. I thought she was developing infection and the seroma cavity and opened it under local anesthesia. The fluid was clear and the culture showed only a few colonies of coagulase-negative staph.  She's been having home health care and/or her husband due to packing. She feels like she is much better.    Exam: Vital signs: Breast: The wound is smaller by about75% There is a little bit of purulent drainage. It could be readily packed still.  Data review: Culture as noted above  Impression: Resolving early infection of seroma cavity.  Plan: Continue local wound care

## 2011-08-23 NOTE — Patient Instructions (Signed)
Continue to pack the cavity daily. See me in two weeks

## 2011-08-26 DIAGNOSIS — T8140XA Infection following a procedure, unspecified, initial encounter: Secondary | ICD-10-CM | POA: Diagnosis not present

## 2011-08-26 DIAGNOSIS — Z853 Personal history of malignant neoplasm of breast: Secondary | ICD-10-CM | POA: Diagnosis not present

## 2011-08-28 ENCOUNTER — Telehealth (INDEPENDENT_AMBULATORY_CARE_PROVIDER_SITE_OTHER): Payer: Self-pay

## 2011-08-28 NOTE — Telephone Encounter (Signed)
Patient has been packing right breast since 08/23/2011.  She has been using wet to dry dressing- dressing will  not stay in place falls out .  No signs of infection . Next appointment is 09/06/2011. Should this patient come in for nurse only to make sure incision has not closed prematurely. Or just proceed with dry dressing. Patient awaits call back for guidance.  (Note to Dr. Jamey Ripa and Lesly Rubenstein who is the office today).

## 2011-08-28 NOTE — Telephone Encounter (Addendum)
Left message for patient to call back. Per Dr Jamey Ripa patient does not need to be seen. It is not unexpected for this area to be closing. To keep clean and covered and we will see at her next appt. Patient made aware of the above and will call with any problems prior to her next visit.

## 2011-08-30 ENCOUNTER — Encounter: Payer: Self-pay | Admitting: *Deleted

## 2011-09-03 ENCOUNTER — Encounter: Payer: Self-pay | Admitting: Family Medicine

## 2011-09-03 ENCOUNTER — Ambulatory Visit (INDEPENDENT_AMBULATORY_CARE_PROVIDER_SITE_OTHER): Payer: Medicare Other | Admitting: Family Medicine

## 2011-09-03 DIAGNOSIS — J45901 Unspecified asthma with (acute) exacerbation: Secondary | ICD-10-CM | POA: Diagnosis not present

## 2011-09-03 DIAGNOSIS — G43909 Migraine, unspecified, not intractable, without status migrainosus: Secondary | ICD-10-CM | POA: Diagnosis not present

## 2011-09-03 DIAGNOSIS — J069 Acute upper respiratory infection, unspecified: Secondary | ICD-10-CM

## 2011-09-03 MED ORDER — QUETIAPINE FUMARATE 25 MG PO TABS: 25.0000 mg | ORAL_TABLET | Freq: Every day | ORAL | Status: DC

## 2011-09-03 MED ORDER — MIRTAZAPINE 15 MG PO TABS: 15.0000 mg | ORAL_TABLET | Freq: Every day | ORAL | Status: DC

## 2011-09-03 MED ORDER — PREDNISONE 20 MG PO TABS: ORAL_TABLET | ORAL | Status: DC

## 2011-09-03 MED ORDER — HYDROCODONE-HOMATROPINE 5-1.5 MG/5ML PO SYRP: ORAL_SOLUTION | ORAL | Status: DC

## 2011-09-03 NOTE — Patient Instructions (Signed)
Drink lots of water   vaporizer or humidifier in her bedroom  Prednisone as directed  Hydromet 1/2 teaspoon 2-3 times daily when necessary for cough  For the migraine headaches restart the Remeron and Seroquel  Return the first week in April for followup

## 2011-09-03 NOTE — Progress Notes (Signed)
  Subjective:    Patient ID: Jacqueline [name], female    DOB: Apr 06, 1935, 77 y.o.   MRN: [id number]  HPI Jacqueline [name] is a 77 year old married female who comes in today for evaluation of 2 problems  Last Thursday she developed a sore throat the next day cough now the cough is worse. No fever no sputum production. No history of lung disease she is a nonsmoker.  She was seen at the headache clinic for many years and they finally had her on a combination of Remeron and Seroquel which stopped her migraine headaches  Recently she tapered off her medicines and now her migraines her back. These to be in the left side of her head and the other in the top of her head. They typically start in the morning and get worse by 10 or 11:00. She has some photophobia but no nausea vomiting. Neurologic review of systems negative she states are exactly like her previous migraines except for the different location   Review of Systems    general and neurologic and pulmonary review of systems otherwise negative Objective:   Physical Exam Well-developed well-nourished female in acute distress HEENT negative neck was supple no adenopathy lungs are clear except for some mild late symmetrical and expiratory wheezing       Assessment & Plan:  Viral syndrome with secondary asthma plan prednisone burst and taper  Migraine headaches restart Remeron and Seroquel followup in 3 weeks

## 2011-09-05 ENCOUNTER — Encounter: Payer: Self-pay | Admitting: Radiation Oncology

## 2011-09-05 ENCOUNTER — Ambulatory Visit: Payer: Medicare Other | Admitting: Radiation Oncology

## 2011-09-05 ENCOUNTER — Ambulatory Visit
Admission: RE | Admit: 2011-09-05 | Discharge: 2011-09-05 | Disposition: A | Discharge status: Home / Self Care | Payer: Medicare Other | Source: Ambulatory Visit | Attending: Radiation Oncology | Admitting: Radiation Oncology

## 2011-09-05 DIAGNOSIS — D0510 Intraductal carcinoma in situ of unspecified breast: Secondary | ICD-10-CM

## 2011-09-05 NOTE — Progress Notes (Signed)
Pt ambulatory steady gait, alert oriented x 3, follow up breast  Ca radt tx, pt right breast, has slight rtythema on breast one spot open in fold 2-3cm, no more drainage stated patient, Dr. Lorriane Shire opened incision 08/23/11, , pt saw Dr. Tawanna Cooler 09/03/11 cough, on prednisone tapered, pt says her wheezing is better, no c/o pian, Sees Dr. Donnie Coffin and DR. Strecht tomorrow 2:55 PM

## 2011-09-05 NOTE — Progress Notes (Signed)
  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  Interval Since Last Radiation:  7 months  Narrative:  The patient returns today for routine follow-up.  The patient recently developed an abscess of the MammoSite seromatous cavity requiring incision and drainage with Dr. Cicero [name]. She is recovering uneventfully from this.                              ALLERGIES:  is allergic to aspirin and codeine sulfate.  Meds: Current Outpatient Prescriptions  Medication Sig Dispense Refill  . calcium gluconate 500 MG tablet Take 500 mg by mouth 2 (two) times daily.        . Cholecalciferol (VITAMIN D3) 2000 UNITS TABS Take by mouth daily.        Marland Kitchen ezetimibe (ZETIA) 10 MG tablet Take 1 tablet (10 mg total) by mouth daily.  100 tablet  3  . HYDROcodone-homatropine (HYCODAN) 5-1.5 MG/5ML syrup One half to 1 teaspoon twice a day when necessary for cough and cold  120 mL  1  . LUMIGAN 0.03 % ophthalmic solution Daily.      . mirtazapine (REMERON) 15 MG tablet Take 1 tablet (15 mg total) by mouth at bedtime.  100 tablet  2  . omeprazole (PRILOSEC OTC) 20 MG tablet Take 20 mg by mouth daily.        . predniSONE (DELTASONE) 20 MG tablet 2 tabs x3 days, 1 tab x3 days, a half a tab x3 days, then a half a tablet Monday Wednesday Friday for a 2 week taper  30 tablet  1  . QUEtiapine (SEROQUEL) 25 MG tablet Take 1 tablet (25 mg total) by mouth at bedtime.  100 tablet  3  . timolol (TIMOPTIC-XR) 0.5 % ophthalmic gel-forming Daily.        Physical Findings: The patient is in no acute distress. Patient is alert and oriented.  weight is  162 lb 3.2 oz (73.573 kg). Her temperature is 97.3 F (36.3 C). Her blood pressure is 139/74 and her pulse is 106. Her respiration is 20 and oxygen saturation is 95%. .  Focused examination of the right breast reveals a well-healing sinus tract superior to the nipple reflecting the drainage site with no active drainage at present. The breast is soft with no tenderness or edema. There is no palpable seroma cavity at this time  Lab Findings: Lab Results  Component Value Date   WBC 8.6 05/01/2011   HGB 13.2 05/01/2011   HCT 39.1 05/01/2011   MCV 92.5 05/01/2011   PLT 193.0 05/01/2011    @LASTCHEM @  Radiographic Findings: No results found.  Impression:  The patient currently exhibits no evidence recurrence. She has a good cosmetic result following breast conservation. She did develop a complication following MammoSite treatment in the form of seroma infection.  Plan:  The patient will continue to follow with Dr. Donnie [name] and Dr. Jamey [name]. She is almost due for baseline followup imaging once she has healed from her seroma infection.  _____________________________________  Jacqueline [name], M.D.

## 2011-09-06 ENCOUNTER — Ambulatory Visit (HOSPITAL_BASED_OUTPATIENT_CLINIC_OR_DEPARTMENT_OTHER): Payer: Medicare Other | Admitting: Oncology

## 2011-09-06 ENCOUNTER — Telehealth: Payer: Self-pay | Admitting: *Deleted

## 2011-09-06 ENCOUNTER — Ambulatory Visit (INDEPENDENT_AMBULATORY_CARE_PROVIDER_SITE_OTHER): Payer: Medicare Other | Admitting: Surgery

## 2011-09-06 ENCOUNTER — Encounter (INDEPENDENT_AMBULATORY_CARE_PROVIDER_SITE_OTHER): Payer: Self-pay | Admitting: Surgery

## 2011-09-06 ENCOUNTER — Other Ambulatory Visit (HOSPITAL_BASED_OUTPATIENT_CLINIC_OR_DEPARTMENT_OTHER): Payer: Medicare Other | Admitting: Lab

## 2011-09-06 ENCOUNTER — Other Ambulatory Visit: Payer: Self-pay | Admitting: *Deleted

## 2011-09-06 VITALS — BP 126/77 | HR 96 | Temp 98.4°F | Ht 63.0 in | Wt 163.5 lb

## 2011-09-06 DIAGNOSIS — N611 Abscess of the breast and nipple: Secondary | ICD-10-CM

## 2011-09-06 DIAGNOSIS — E559 Vitamin D deficiency, unspecified: Secondary | ICD-10-CM

## 2011-09-06 DIAGNOSIS — C50919 Malignant neoplasm of unspecified site of unspecified female breast: Secondary | ICD-10-CM

## 2011-09-06 DIAGNOSIS — M899 Disorder of bone, unspecified: Secondary | ICD-10-CM | POA: Diagnosis not present

## 2011-09-06 DIAGNOSIS — Z170 Estrogen receptor positive status [ER+]: Secondary | ICD-10-CM | POA: Diagnosis not present

## 2011-09-06 DIAGNOSIS — N61 Mastitis without abscess: Secondary | ICD-10-CM

## 2011-09-06 LAB — COMPREHENSIVE METABOLIC PANEL
ALT: 44 U/L — ABNORMAL HIGH (ref 0–35)
AST: 34 U/L (ref 0–37)
Albumin: 3.9 g/dL (ref 3.5–5.2)
Alkaline Phosphatase: 112 U/L (ref 39–117)
BUN: 26 mg/dL — ABNORMAL HIGH (ref 6–23)
CO2: 28 mEq/L (ref 19–32)
Calcium: 10.2 mg/dL (ref 8.4–10.5)
Chloride: 106 mEq/L (ref 96–112)
Creatinine, Ser: 1.25 mg/dL — ABNORMAL HIGH (ref 0.50–1.10)
Glucose, Bld: 111 mg/dL — ABNORMAL HIGH (ref 70–99)
Potassium: 2.8 mEq/L — ABNORMAL LOW (ref 3.5–5.3)
Sodium: 143 mEq/L (ref 135–145)
Total Bilirubin: 0.3 mg/dL (ref 0.3–1.2)
Total Protein: 8 g/dL (ref 6.0–8.3)

## 2011-09-06 LAB — CBC & DIFF AND RETIC
BASO%: 0.1 % (ref 0.0–2.0)
Basophils Absolute: 0 10*3/uL (ref 0.0–0.1)
EOS%: 0 % (ref 0.0–7.0)
Eosinophils Absolute: 0 10*3/uL (ref 0.0–0.5)
HCT: 39.2 % (ref 34.8–46.6)
HGB: 13.1 g/dL (ref 11.6–15.9)
Immature Retic Fract: 9.6 % (ref 1.60–10.00)
LYMPH%: 22.1 % (ref 14.0–49.7)
MCH: 29 pg (ref 25.1–34.0)
MCHC: 33.4 g/dL (ref 31.5–36.0)
MCV: 86.9 fL (ref 79.5–101.0)
MONO#: 0.7 10*3/uL (ref 0.1–0.9)
MONO%: 4.5 % (ref 0.0–14.0)
NEUT#: 11 10*3/uL — ABNORMAL HIGH (ref 1.5–6.5)
NEUT%: 73.3 % (ref 38.4–76.8)
Platelets: 222 10*3/uL (ref 145–400)
RBC: 4.51 10*6/uL (ref 3.70–5.45)
RDW: 13.2 % (ref 11.2–14.5)
Retic %: 1.19 % (ref 0.70–2.10)
Retic Ct Abs: 53.67 10*3/uL (ref 33.70–90.70)
WBC: 15 10*3/uL — ABNORMAL HIGH (ref 3.9–10.3)
lymph#: 3.3 10*3/uL (ref 0.9–3.3)

## 2011-09-06 NOTE — Telephone Encounter (Signed)
gave patient appointment [phone] printed out calendar and gave to the patient made patient appointment for mammogram and bone density breast center 02-07-2012 at 10:30am

## 2011-09-06 NOTE — Progress Notes (Signed)
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 She had a prior lumpectomy and was treated with a MammoSite for radiation. I thought she was developing infection and the seroma cavity and opened it under local anesthesia. The fluid was clear and the culture showed only a few colonies of coagulase-negative staph.  She's been doing dressing changes and the area is healing    Exam: Vital signs: Breast: The wound is now only the size of a cotton tipped applicator. The nipple is now tethered towards the lumpectomy site as a result, I think, of the cavity closing Data review: Culture as noted above  Impression: Resolving early infection of seroma cavity.  Plan: Continue local wound care RTC 10 days

## 2011-09-06 NOTE — Progress Notes (Signed)
Hematology and Oncology Follow Up Visit  Jacqueline [name] [id number] August 01, 1934 77 y.o. 09/06/2011 4:23 PM PCP Jenel Lucks  Principle Diagnosis:  PROBLEM:  T1b N0 lt breast cancer status post lumpectomy, 04/07/06,  sentinel lymph node evaluation followed by MammoSite and tamoxifen therapy since January 2008. Rt breast cancer, DCIS, er/pr+ , s/p lumpectomy, 8/12, s/p partial breast xrt., complicated by abscess  Interim History:  There have been no intercurrent illness; she has had her mammosite area drained, and has been on antibiotics. She is arimidex and is doing well with this.  Medications: I have reviewed the patient's current medications.  Allergies:  Allergies  Allergen Reactions  . Aspirin     REACTION: stomach upset  . Codeine Sulfate     REACTION: stomach upset    Past Medical History, Surgical history, Social history, and Family History were reviewed and updated.  Review of Systems: Constitutional:  Negative for fever, chills, night sweats, anorexia, weight loss, pain. Cardiovascular: negative Respiratory: negative Neurological: negative Dermatological: negative ENT: negative Skin Gastrointestinal: no abdominal pain, change in bowel habits, or black or bloody stools Genito-Urinary: negative Hematological and Lymphatic: negative Breast: negative Musculoskeletal: negative Remaining ROS negative.  Physical Exam: Blood pressure 126/77, pulse 96, temperature 98.4 F (36.9 C), temperature source Oral, height 5\' 3"  (1.6 m), weight 163 lb 8 oz (74.163 kg). ECOG: 0 HEENT:  Sclerae anicteric, conjunctivae pink.  Oropharynx clear.  No mucositis or candidiasis.  Nodes:  No cervical, supraclavicular, or axillary lymphadenopathy palpated.  Breast Exam:  Right breast is s/p lumpectomy and PBI, area is packed with sl eryhtema.  Left breast is benign.  No masses, discharge, skin change, or nipple inversion..  Lungs:  Clear to auscultation bilaterally.  No crackles, rhonchi,  or wheezes.  Heart:  Regular rate and rhythm.  Abdomen:  Soft, nontender.  Positive bowel sounds.  No organomegaly or masses palpated.  Musculoskeletal:  No focal spinal tenderness to palpation.  Extremities:  Benign.  No peripheral edema or cyanosis.  Skin:  Benign.  Neuro:  Nonfocal.   Lab Results: Lab Results  Component Value Date   WBC 15.0* 09/06/2011   HGB 13.1 09/06/2011   HCT 39.2 09/06/2011   MCV 86.9 09/06/2011   PLT 222 09/06/2011     Chemistry      Component Value Date/Time   NA 139 05/01/2011 1159   K 3.5 05/01/2011 1159   CL 106 05/01/2011 1159   CO2 23 05/01/2011 1159   BUN 18 05/01/2011 1159   CREATININE 1.4* 05/01/2011 1159      Component Value Date/Time   CALCIUM 9.1 05/01/2011 1159   ALKPHOS 89 05/01/2011 1159   AST 19 05/01/2011 1159   ALT 21 05/01/2011 1159   BILITOT 0.4 05/01/2011 1159      . Impression and Plan: 77 yo woman with hx of IDC 2008 and DCIS 2012. She is currently on arimidex and dealing with residual abscess formation form seroma cavity. F/u 6 months with imaging studies and blood work.  More than 50% of the visit was spent in patient-related counselling   Pierce Crane, MD 3/8/20134:23 PM

## 2011-09-07 LAB — VITAMIN D 25 HYDROXY (VIT D DEFICIENCY, FRACTURES): Vit D, 25-Hydroxy: 43 ng/mL (ref 30–89)

## 2011-09-09 ENCOUNTER — Other Ambulatory Visit: Payer: Self-pay | Admitting: *Deleted

## 2011-09-09 MED ORDER — POTASSIUM CHLORIDE CRYS ER 20 MEQ PO TBCR: 20.0000 meq | EXTENDED_RELEASE_TABLET | Freq: Two times a day (BID) | ORAL | Status: DC

## 2011-09-09 NOTE — Telephone Encounter (Signed)
Per MD, KDUR 20 MEQ BID for 2 weeks has been called in to pharmacy  Pt has been instructed to call PCP to follow up

## 2011-09-18 ENCOUNTER — Ambulatory Visit (INDEPENDENT_AMBULATORY_CARE_PROVIDER_SITE_OTHER): Payer: Medicare Other | Admitting: Surgery

## 2011-09-18 ENCOUNTER — Encounter (INDEPENDENT_AMBULATORY_CARE_PROVIDER_SITE_OTHER): Payer: Self-pay | Admitting: Surgery

## 2011-09-18 VITALS — BP 146/84 | HR 88 | Temp 97.4°F | Resp 12 | Ht 63.0 in | Wt 168.6 lb

## 2011-09-18 DIAGNOSIS — N61 Mastitis without abscess: Secondary | ICD-10-CM

## 2011-09-18 DIAGNOSIS — Z9889 Other specified postprocedural states: Secondary | ICD-10-CM

## 2011-09-18 DIAGNOSIS — N611 Abscess of the breast and nipple: Secondary | ICD-10-CM

## 2011-09-18 NOTE — Patient Instructions (Signed)
Call for another appointment if any flare up of the infection. Otherwise plan to see me for a long term follow up in August

## 2011-09-18 NOTE — Progress Notes (Signed)
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 She had a prior lumpectomy and was treated with a MammoSite for radiation. I thought she was developing infection and the seroma cavity and opened it under local anesthesia. The fluid was clear and the culture showed only a few colonies of coagulase-negative staph.  She's been doing dressing changes and the area is now closed    Exam: Vital signs: Breast: The wound is now oclosed. There is an indentation at the old incision The nipple is now tethered towards the lumpectomy site as a result, I think, of the cavity closing Data review: no new Impression: Resolvedearly infection of seroma cavity.  Plan: RTC PRN for this and in August for long term breast cancer follow up

## 2011-09-26 ENCOUNTER — Encounter: Payer: Self-pay | Admitting: Family Medicine

## 2011-09-26 ENCOUNTER — Ambulatory Visit (INDEPENDENT_AMBULATORY_CARE_PROVIDER_SITE_OTHER): Payer: Medicare Other | Admitting: Family Medicine

## 2011-09-26 VITALS — BP 124/80 | Temp 97.7°F | Wt 170.0 lb

## 2011-09-26 DIAGNOSIS — G43909 Migraine, unspecified, not intractable, without status migrainosus: Secondary | ICD-10-CM | POA: Diagnosis not present

## 2011-09-26 DIAGNOSIS — J45901 Unspecified asthma with (acute) exacerbation: Secondary | ICD-10-CM | POA: Diagnosis not present

## 2011-09-26 DIAGNOSIS — E876 Hypokalemia: Secondary | ICD-10-CM | POA: Diagnosis not present

## 2011-09-26 DIAGNOSIS — F411 Generalized anxiety disorder: Secondary | ICD-10-CM

## 2011-09-26 DIAGNOSIS — C50919 Malignant neoplasm of unspecified site of unspecified female breast: Secondary | ICD-10-CM

## 2011-09-26 LAB — BASIC METABOLIC PANEL
BUN: 25 mg/dL — ABNORMAL HIGH (ref 6–23)
CO2: 27 mEq/L (ref 19–32)
Calcium: 9.3 mg/dL (ref 8.4–10.5)
Chloride: 108 mEq/L (ref 96–112)
Creatinine, Ser: 1.2 mg/dL (ref 0.4–1.2)
GFR: 47.96 mL/min — ABNORMAL LOW (ref >60.00)
Glucose, Bld: 95 mg/dL (ref 70–99)
Potassium: 4.1 mEq/L (ref 3.5–5.1)
Sodium: 142 mEq/L (ref 135–145)

## 2011-09-26 NOTE — Patient Instructions (Signed)
Continue current medications  We've turned the first week in December for your annual exam  Nonfasting labs one week prior

## 2011-09-26 NOTE — Progress Notes (Signed)
  Subjective:    Patient ID: Jacqueline [name], female    DOB: 1934-11-14, 77 y.o.   MRN: [id number]  HPI Jacqueline [name] is a 77 year old married female nonsmoker who comes in today for followup of 2 problems  We saw her a couple weeks ago with a viral syndrome the triggered her asthma. We put on a short course of prednisone. She tapered off 2 weeks ago and feels well except for daily allergic rhinitis.  We also restarted her Remeron and Seroquel because of a history of migraine headaches. Her migraine headaches not completely disappeared   Review of Systems Pulmonary and psychiatric review of systems otherwise negative    Objective:   Physical Exam  Well-developed well-nourished female in no acute distress lungs are clear to auscultation cardiac exam normal      Assessment & Plan:  Migraine headache continue current meds  Asthma resolve with prednisone  Return December 2013 annual exam

## 2011-10-16 DIAGNOSIS — H409 Unspecified glaucoma: Secondary | ICD-10-CM | POA: Diagnosis not present

## 2011-10-16 DIAGNOSIS — H4011X0 Primary open-angle glaucoma, stage unspecified: Secondary | ICD-10-CM | POA: Diagnosis not present

## 2011-10-16 DIAGNOSIS — Z961 Presence of intraocular lens: Secondary | ICD-10-CM | POA: Diagnosis not present

## 2012-01-17 ENCOUNTER — Encounter (INDEPENDENT_AMBULATORY_CARE_PROVIDER_SITE_OTHER): Payer: Medicare Other | Admitting: Surgery

## 2012-01-20 ENCOUNTER — Other Ambulatory Visit: Payer: Medicare Other

## 2012-01-21 ENCOUNTER — Encounter (INDEPENDENT_AMBULATORY_CARE_PROVIDER_SITE_OTHER): Payer: Self-pay | Admitting: Surgery

## 2012-01-21 ENCOUNTER — Ambulatory Visit (INDEPENDENT_AMBULATORY_CARE_PROVIDER_SITE_OTHER): Payer: Medicare Other | Admitting: Surgery

## 2012-01-21 VITALS — BP 138/86 | HR 70 | Temp 97.8°F | Resp 14 | Ht 63.0 in | Wt 167.0 lb

## 2012-01-21 DIAGNOSIS — Z853 Personal history of malignant neoplasm of breast: Secondary | ICD-10-CM | POA: Diagnosis not present

## 2012-01-21 NOTE — Progress Notes (Signed)
NAME: [name]       DOB: August 10, 1934           DATE: 01/21/2012       MRN: [id number]   Jacqueline [name] is a 77 y.o.Marland Kitchenfemale who presents for routine followup of her Bilateral breast cancers diagnosed in 2012 rigth side and 2007 Left side and treated with Lumpectomies and MammoSite. She has no problems or concerns on either side.  PFSH: She has had no significant changes since the last visit here.  ROS: There have been no significant changes since the last visit here  EXAM:  VS: BP 138/86  Pulse 70  Temp 97.8 F (36.6 C) (Temporal)  Resp 14  Ht 5\' 3"  (1.6 m)  Wt 167 lb (75.751 kg)  BMI 29.58 kg/m2   General: The patient is alert, oriented, generally healthy appearing, NAD. Mood and affect are normal.  Breasts:  Right is s/p lumpectomy and mammosite. There is some loss of tissue at the lumpectomy site and mild tenderness.otherwise breast is unremarkable. The left breast is normal status post lumpectomy radiation.no evidence of local recurrence on either side. Lymphatics: She has no axillary or supraclavicular adenopathy on either side.  Extremities: Full ROM of the surgical side with no lymphedema noted.  Data Reviewed: No new data. Mammogram is scheduled in August.  Impression: Doing well, with no evidence of recurrent cancer or new cancer  Plan: Will continue to follow up on an annual basis here.

## 2012-01-21 NOTE — Patient Instructions (Signed)
Continue annual mammograms and followup here once a year

## 2012-01-30 DIAGNOSIS — M1710 Unilateral primary osteoarthritis, unspecified knee: Secondary | ICD-10-CM | POA: Diagnosis not present

## 2012-01-30 DIAGNOSIS — M722 Plantar fascial fibromatosis: Secondary | ICD-10-CM | POA: Diagnosis not present

## 2012-02-07 ENCOUNTER — Ambulatory Visit
Admission: RE | Admit: 2012-02-07 | Discharge: 2012-02-07 | Disposition: A | Discharge status: Home / Self Care | Payer: Medicare Other | Source: Ambulatory Visit | Attending: Oncology | Admitting: Oncology

## 2012-02-07 DIAGNOSIS — N6459 Other signs and symptoms in breast: Secondary | ICD-10-CM | POA: Diagnosis not present

## 2012-02-07 DIAGNOSIS — E559 Vitamin D deficiency, unspecified: Secondary | ICD-10-CM

## 2012-02-07 DIAGNOSIS — C50919 Malignant neoplasm of unspecified site of unspecified female breast: Secondary | ICD-10-CM

## 2012-02-07 DIAGNOSIS — Z780 Asymptomatic menopausal state: Secondary | ICD-10-CM | POA: Diagnosis not present

## 2012-02-07 DIAGNOSIS — M810 Age-related osteoporosis without current pathological fracture: Secondary | ICD-10-CM | POA: Diagnosis not present

## 2012-03-01 DEATH — deceased

## 2012-03-13 ENCOUNTER — Telehealth: Payer: Self-pay | Admitting: *Deleted

## 2012-03-13 ENCOUNTER — Ambulatory Visit (HOSPITAL_BASED_OUTPATIENT_CLINIC_OR_DEPARTMENT_OTHER): Payer: Medicare Other | Admitting: Oncology

## 2012-03-13 ENCOUNTER — Other Ambulatory Visit (HOSPITAL_BASED_OUTPATIENT_CLINIC_OR_DEPARTMENT_OTHER): Payer: Medicare Other | Admitting: Lab

## 2012-03-13 VITALS — BP 145/86 | HR 76 | Temp 98.8°F | Resp 20 | Ht 63.0 in | Wt 168.0 lb

## 2012-03-13 DIAGNOSIS — Z170 Estrogen receptor positive status [ER+]: Secondary | ICD-10-CM

## 2012-03-13 DIAGNOSIS — E559 Vitamin D deficiency, unspecified: Secondary | ICD-10-CM

## 2012-03-13 DIAGNOSIS — C50419 Malignant neoplasm of upper-outer quadrant of unspecified female breast: Secondary | ICD-10-CM | POA: Diagnosis not present

## 2012-03-13 DIAGNOSIS — C50919 Malignant neoplasm of unspecified site of unspecified female breast: Secondary | ICD-10-CM

## 2012-03-13 LAB — COMPREHENSIVE METABOLIC PANEL (CC13)
ALT: 19 U/L (ref 0–55)
AST: 17 U/L (ref 5–34)
Albumin: 3.4 g/dL — ABNORMAL LOW (ref 3.5–5.0)
Alkaline Phosphatase: 109 U/L (ref 40–150)
BUN: 16 mg/dL (ref 7.0–26.0)
CO2: 25 mEq/L (ref 22–29)
Calcium: 8.8 mg/dL (ref 8.4–10.4)
Chloride: 110 mEq/L — ABNORMAL HIGH (ref 98–107)
Creatinine: 1.1 mg/dL (ref 0.6–1.1)
Glucose: 100 mg/dl — ABNORMAL HIGH (ref 70–99)
Potassium: 3.8 mEq/L (ref 3.5–5.1)
Sodium: 143 mEq/L (ref 136–145)
Total Bilirubin: 0.3 mg/dL (ref 0.20–1.20)
Total Protein: 6.7 g/dL (ref 6.4–8.3)

## 2012-03-13 LAB — CBC WITH DIFFERENTIAL/PLATELET
BASO%: 0.4 % (ref 0.0–2.0)
Basophils Absolute: 0 10*3/uL (ref 0.0–0.1)
EOS%: 1.6 % (ref 0.0–7.0)
Eosinophils Absolute: 0.1 10*3/uL (ref 0.0–0.5)
HCT: 37.9 % (ref 34.8–46.6)
HGB: 12.8 g/dL (ref 11.6–15.9)
LYMPH%: 28.3 % (ref 14.0–49.7)
MCH: 29.8 pg (ref 25.1–34.0)
MCHC: 33.7 g/dL (ref 31.5–36.0)
MCV: 88.5 fL (ref 79.5–101.0)
MONO#: 0.6 10*3/uL (ref 0.1–0.9)
MONO%: 9.1 % (ref 0.0–14.0)
NEUT#: 3.9 10*3/uL (ref 1.5–6.5)
NEUT%: 60.6 % (ref 38.4–76.8)
Platelets: 161 10*3/uL (ref 145–400)
RBC: 4.28 10*6/uL (ref 3.70–5.45)
RDW: 13.3 % (ref 11.2–14.5)
WBC: 6.5 10*3/uL (ref 3.9–10.3)
lymph#: 1.8 10*3/uL (ref 0.9–3.3)

## 2012-03-13 NOTE — Telephone Encounter (Signed)
Gave patient appointment for six months

## 2012-03-13 NOTE — Progress Notes (Signed)
Hematology and Oncology Follow Up Visit  Jacqueline [name] [id number] [birth year] 77 y.o. 03/13/2012 11:10 AM PCP Jenel Lucks Principle Diagnosis:  :  T1b N0 breast cancer status post lt  lumpectomy, sentinel lymph node evaluation followed by MammoSite and tamoxifen therapy since January 2008.  INTERVAL HISTORY:  Since being seen last, Ms. Hubble underwent a mammogram in January 2012.  This showed slight abnormality in the medial portion of the right breast.  This was followed up.  She ultimately had a 30-month follow-up which showed some distortion with some hypoechoic features.  Biopsy on 01/03/2011 showed DCIS which was ER/PR positive.  MRI, July 16, showed a regular enhancing mass measuring 1.9 cm.  She underwent a core biopsy with some additional features which showed atypical lobular hyperplasia.  The patient was felt to be a good candidate for excision followed by partial breast radiation.  She subsequently underwent excision by Dr. Jamey Ripa on 01/30/2011.  The final pathology showed a 1-cm focus of DCIS.  Resection margins were all clear.  Final ER and PR status was 89% and 75%, respectively.  Of note is that she was continued on tamoxifen to this time.  She completed high-dose partial breast radiation on 02/12/2011.  She tolerated this well.   We had a discussion today regarding follow-up plans.  Clearly, she has progressed and developed DCIS which is ER/PR positive on tamoxifen.  She has almost completed 5 years of tamoxifen.  My recommendation was that she should consider AI therapy.  She will go ahead and repeat a bone density test as her most recent were fairly normal, but she would need one in 2013.  In addition, I recommended she take additional vitamin D and ca  Interim History:  There have been no intercurrent illness, hospitalizations or medication changes.  Medications: I have reviewed the patient's current medications.  Allergies:  Allergies  Allergen Reactions  . Aspirin       REACTION: stomach upset  . Codeine Sulfate     REACTION: stomach upset    Past Medical History, Surgical history, Social history, and Family History were reviewed and updated.  Review of Systems: Constitutional:  Negative for fever, chills, night sweats, anorexia, weight loss, pain. Cardiovascular: negative Respiratory: negative Neurological: negative Dermatological: negative ENT: negative Skin Gastrointestinal: negative Genito-Urinary: negative Hematological and Lymphatic: negative Breast: negative Musculoskeletal: negative Remaining ROS negative.  Physical Exam: Blood pressure 145/86, pulse 76, temperature 98.8 F (37.1 C), temperature source Oral, resp. rate 20, height 5\' 3"  (1.6 m), weight 168 lb (76.204 kg). ECOG: 0 HEENT:  Sclerae anicteric, conjunctivae pink.  Oropharynx clear.  No mucositis or candidiasis.  Nodes:  No cervical, supraclavicular, or axillary lymphadenopathy palpated.  Breast Exam:  She status post lumpectomy with well-healed surgical scar. There is no evidence of local recurrence..  Lungs:  Clear to auscultation bilaterally.  No crackles, rhonchi, or wheezes.  Heart:  Regular rate and rhythm.  Abdomen:  Soft, nontender.  Positive bowel sounds.  No organomegaly or masses palpated.  Musculoskeletal:  No focal spinal tenderness to palpation.  Extremities:  Benign.  No peripheral edema or cyanosis.  Skin:  Benign.  Neuro:  Nonfocal.   Lab Results: Lab Results  Component Value Date   WBC 6.5 03/13/2012   HGB 12.8 03/13/2012   HCT 37.9 03/13/2012   MCV 88.5 03/13/2012   PLT 161 03/13/2012     Chemistry      Component Value Date/Time   NA 142 09/26/2011 1103   K 4.1  09/26/2011 1103   CL 108 09/26/2011 1103   CO2 27 09/26/2011 1103   BUN 25* 09/26/2011 1103   CREATININE 1.2 09/26/2011 1103      Component Value Date/Time   CALCIUM 9.3 09/26/2011 1103   ALKPHOS 112 09/06/2011 1528   AST 34 09/06/2011 1528   ALT 44* 09/06/2011 1528   BILITOT 0.3 09/06/2011 1528       Impression and Plan: 77 year old woman with history of bilateral breast cancer with new onset of DCIS in the right breast. I recommended AI. I discussed side effects. She is little reluctant to go ahead with this. I will plan serial followup in and spent 6 months time.  More than 50% of the visit was spent in patient-related counselling   Pierce Crane, MD 9/13/201311:10 AM

## 2012-03-14 LAB — VITAMIN D 25 HYDROXY (VIT D DEFICIENCY, FRACTURES): Vit D, 25-Hydroxy: 37 ng/mL (ref 30–89)

## 2012-04-06 DIAGNOSIS — L940 Localized scleroderma [morphea]: Secondary | ICD-10-CM | POA: Diagnosis not present

## 2012-04-06 DIAGNOSIS — C44319 Basal cell carcinoma of skin of other parts of face: Secondary | ICD-10-CM | POA: Diagnosis not present

## 2012-04-06 DIAGNOSIS — Z85828 Personal history of other malignant neoplasm of skin: Secondary | ICD-10-CM | POA: Diagnosis not present

## 2012-04-15 ENCOUNTER — Ambulatory Visit (INDEPENDENT_AMBULATORY_CARE_PROVIDER_SITE_OTHER): Payer: Medicare Other

## 2012-04-15 DIAGNOSIS — Z23 Encounter for immunization: Secondary | ICD-10-CM

## 2012-04-16 ENCOUNTER — Encounter: Payer: Self-pay | Admitting: Gastroenterology

## 2012-04-24 ENCOUNTER — Encounter: Payer: Self-pay | Admitting: Gastroenterology

## 2012-05-13 ENCOUNTER — Ambulatory Visit (AMBULATORY_SURGERY_CENTER): Payer: Medicare Other | Admitting: *Deleted

## 2012-05-13 VITALS — Ht 63.0 in | Wt 168.3 lb

## 2012-05-13 DIAGNOSIS — Z1211 Encounter for screening for malignant neoplasm of colon: Secondary | ICD-10-CM

## 2012-05-13 MED ORDER — NA SULFATE-K SULFATE-MG SULF 17.5-3.13-1.6 GM/177ML PO SOLN: ORAL | Status: DC

## 2012-06-03 ENCOUNTER — Encounter: Payer: Self-pay | Admitting: Gastroenterology

## 2012-06-03 ENCOUNTER — Ambulatory Visit (AMBULATORY_SURGERY_CENTER): Payer: Medicare Other | Admitting: Gastroenterology

## 2012-06-03 VITALS — BP 120/66 | HR 67 | Temp 97.9°F | Resp 17 | Ht 63.0 in | Wt 168.0 lb

## 2012-06-03 DIAGNOSIS — Z1211 Encounter for screening for malignant neoplasm of colon: Secondary | ICD-10-CM

## 2012-06-03 DIAGNOSIS — J45909 Unspecified asthma, uncomplicated: Secondary | ICD-10-CM | POA: Diagnosis not present

## 2012-06-03 DIAGNOSIS — D126 Benign neoplasm of colon, unspecified: Secondary | ICD-10-CM

## 2012-06-03 DIAGNOSIS — K5730 Diverticulosis of large intestine without perforation or abscess without bleeding: Secondary | ICD-10-CM

## 2012-06-03 MED ORDER — SODIUM CHLORIDE 0.9 % IV SOLN: 500.0000 mL | INTRAVENOUS | Status: DC

## 2012-06-03 NOTE — Progress Notes (Signed)
Patient did not experience any of the following events: a burn prior to discharge; a fall within the facility; wrong site/side/patient/procedure/implant event; or a hospital transfer or hospital admission upon discharge from the facility. (G8907) Patient did not have preoperative order for IV antibiotic SSI prophylaxis. (G8918)  

## 2012-06-03 NOTE — Patient Instructions (Addendum)
Impressions/recommendations:  Polyp (handout given) Diverticulosis (handout given)  Repeat colonoscopy pending pathology results of polyp removed.  YOU HAD AN ENDOSCOPIC PROCEDURE TODAY AT THE [facility]: Refer to the procedure report that was given to you for any specific questions about what was found during the examination.  If the procedure report does not answer your questions, please call your gastroenterologist to clarify.  If you requested that your care partner not be given the details of your procedure findings, then the procedure report has been included in a sealed envelope for you to review at your convenience later.  YOU SHOULD EXPECT: Some feelings of bloating in the abdomen. Passage of more gas than usual.  Walking can help get rid of the air that was put into your GI tract during the procedure and reduce the bloating. If you had a lower endoscopy (such as a colonoscopy or flexible sigmoidoscopy) you may notice spotting of blood in your stool or on the toilet paper. If you underwent a bowel prep for your procedure, then you may not have a normal bowel movement for a few days.  DIET: Your first meal following the procedure should be a light meal and then it is ok to progress to your normal diet.  A half-sandwich or bowl of soup is an example of a good first meal.  Heavy or fried foods are harder to digest and may make you feel nauseous or bloated.  Likewise meals heavy in dairy and vegetables can cause extra gas to form and this can also increase the bloating.  Drink plenty of fluids but you should avoid alcoholic beverages for 24 hours.  ACTIVITY: Your care partner should take you home directly after the procedure.  You should plan to take it easy, moving slowly for the rest of the day.  You can resume normal activity the day after the procedure however you should NOT DRIVE or use heavy machinery for 24 hours (because of the sedation medicines used during the test).     SYMPTOMS TO REPORT IMMEDIATELY: A gastroenterologist can be reached at any hour.  During normal business hours, 8:30 AM to 5:00 PM Monday through Friday, call [phone].  After hours and on weekends, please call the GI answering service at [phone] who will take a message and have the physician on call contact you.   Following lower endoscopy (colonoscopy or flexible sigmoidoscopy):  Excessive amounts of blood in the stool  Significant tenderness or worsening of abdominal pains  Swelling of the abdomen that is new, acute  Fever of 100F or higher  FOLLOW UP: If any biopsies were taken you will be contacted by phone or by letter within the next 1-3 weeks.  Call your gastroenterologist if you have not heard about the biopsies in 3 weeks.  Our staff will call the home number listed on your records the next business day following your procedure to check on you and address any questions or concerns that you may have at that time regarding the information given to you following your procedure. This is a courtesy call and so if there is no answer at the home number and we have not heard from you through the emergency physician on call, we will assume that you have returned to your regular daily activities without incident.  SIGNATURES/CONFIDENTIALITY: You and/or your care partner have signed paperwork which will be entered into your electronic medical record.  These signatures attest to the fact that that the information above on  your After Visit Summary has been reviewed and is understood.  Full responsibility of the confidentiality of this discharge information lies with you and/or your care-partner.

## 2012-06-03 NOTE — Progress Notes (Signed)
PATIENT HAS HAD BILATERAL LUMPECTOMIES FOR BREAST CANCER. STATES SHE HAS BEEN TOLD HER LEFT ARM CAN BE USED FOR IV STICKS. BP CUFF ON RIGHT CALF.

## 2012-06-03 NOTE — Op Note (Signed)
[facility] [address] [place], [address]   COLONOSCOPY PROCEDURE REPORT  PATIENT: Jacqueline [name], Jacqueline [name]  MR#: [id number] BIRTHDATE: 21-Jan-1935 , 77  yrs. old GENDER: Female ENDOSCOPIST: Louis Meckel, MD REFERRED MW:[id number] Shawnie Dapper, M.D. PROCEDURE DATE:  06/03/2012 PROCEDURE:   Colonoscopy with snare polypectomy ASA CLASS:   Class II INDICATIONS:average risk screening. MEDICATIONS: MAC sedation, administered by CRNA and propofol (Diprivan) 100mg  IV  DESCRIPTION OF PROCEDURE:   After the risks benefits and alternatives of the procedure were thoroughly explained, informed consent was obtained.  A digital rectal exam revealed no abnormalities of the rectum.   The LB GIF-H180 [id number] and LB CF-[id number] [id number]  endoscope was introduced through the anus and advanced to the cecum, which was identified by both the appendix and ileocecal valve. No adverse events experienced.   The quality of the prep was Suprep excellent  The instrument was then slowly withdrawn as the colon was fully examined.      COLON FINDINGS: A sessile polyp measuring 6 mm in size was found in the transverse colon.  A polypectomy was performed with a cold snare.  The resection was complete and the polyp tissue was completely retrieved.   Moderate diverticulosis was noted in the sigmoid colon.   The colon mucosa was otherwise normal. Retroflexed views revealed no abnormalities. The time to cecum=4 minutes 03 seconds.  Withdrawal time=7 minutes 43 seconds.  The scope was withdrawn and the procedure completed. COMPLICATIONS: There were no complications.  ENDOSCOPIC IMPRESSION: 1.   Sessile polyp measuring 6 mm in size was found in the transverse colon; polypectomy was performed with a cold snare 2.   Moderate diverticulosis was noted in the sigmoid colon 3.   The colon mucosa was otherwise normal  RECOMMENDATIONS: If the polyp(s) removed today are proven to be  adenomatous (pre-cancerous) polyps, you will need a repeat colonoscopy in 5 years.  Otherwise you should continue to follow colorectal cancer screening guidelines for "routine risk" patients with colonoscopy in 10 years.  You will receive a letter within 1-2 weeks with the results of your biopsy as well as final recommendations.  Please call my office if you have not received a letter after 3 weeks.   eSigned:  Louis Meckel, MD 06/03/2012 11:16 AM   cc:

## 2012-06-04 ENCOUNTER — Telehealth: Payer: Self-pay | Admitting: *Deleted

## 2012-06-04 NOTE — Telephone Encounter (Signed)
  Follow up Call-  Call back number 06/03/2012  Post procedure Call Back phone  # [phone]  Permission to leave phone message Yes     Patient questions:  Do you have a fever, pain , or abdominal swelling? no Pain Score  0 *  Have you tolerated food without any problems? yes  Have you been able to return to your normal activities? yes  Do you have any questions about your discharge instructions: Diet   no Medications  no Follow up visit  no  Do you have questions or concerns about your Care? no  Actions: * If pain score is 4 or above: No action needed, pain <4.

## 2012-06-08 ENCOUNTER — Ambulatory Visit (INDEPENDENT_AMBULATORY_CARE_PROVIDER_SITE_OTHER): Payer: Medicare Other | Admitting: Family Medicine

## 2012-06-08 ENCOUNTER — Encounter: Payer: Self-pay | Admitting: Family Medicine

## 2012-06-08 VITALS — BP 120/80 | Temp 98.2°F | Ht 61.75 in | Wt 164.0 lb

## 2012-06-08 DIAGNOSIS — E785 Hyperlipidemia, unspecified: Secondary | ICD-10-CM

## 2012-06-08 DIAGNOSIS — K219 Gastro-esophageal reflux disease without esophagitis: Secondary | ICD-10-CM

## 2012-06-08 DIAGNOSIS — IMO0002 Reserved for concepts with insufficient information to code with codable children: Secondary | ICD-10-CM

## 2012-06-08 DIAGNOSIS — Z23 Encounter for immunization: Secondary | ICD-10-CM

## 2012-06-08 DIAGNOSIS — D0510 Intraductal carcinoma in situ of unspecified breast: Secondary | ICD-10-CM

## 2012-06-08 DIAGNOSIS — G43909 Migraine, unspecified, not intractable, without status migrainosus: Secondary | ICD-10-CM

## 2012-06-08 DIAGNOSIS — Z853 Personal history of malignant neoplasm of breast: Secondary | ICD-10-CM

## 2012-06-08 LAB — HEPATIC FUNCTION PANEL
ALT: 23 U/L (ref 0–35)
AST: 23 U/L (ref 0–37)
Albumin: 3.8 g/dL (ref 3.5–5.2)
Alkaline Phosphatase: 94 U/L (ref 39–117)
Bilirubin, Direct: 0 mg/dL (ref 0.0–0.3)
Total Bilirubin: 0.5 mg/dL (ref 0.3–1.2)
Total Protein: 7.1 g/dL (ref 6.0–8.3)

## 2012-06-08 LAB — CBC WITH DIFFERENTIAL/PLATELET
Basophils Absolute: 0 10*3/uL (ref 0.0–0.1)
Basophils Relative: 0.5 % (ref 0.0–3.0)
Eosinophils Absolute: 0.1 10*3/uL (ref 0.0–0.7)
Eosinophils Relative: 1.3 % (ref 0.0–5.0)
HCT: 39.3 % (ref 36.0–46.0)
Hemoglobin: 13.1 g/dL (ref 12.0–15.0)
Lymphocytes Relative: 28.4 % (ref 12.0–46.0)
Lymphs Abs: 1.9 10*3/uL (ref 0.7–4.0)
MCHC: 33.3 g/dL (ref 30.0–36.0)
MCV: 89.3 fl (ref 78.0–100.0)
Monocytes Absolute: 0.5 10*3/uL (ref 0.1–1.0)
Monocytes Relative: 7.7 % (ref 3.0–12.0)
Neutro Abs: 4.1 10*3/uL (ref 1.4–7.7)
Neutrophils Relative %: 62.1 % (ref 43.0–77.0)
Platelets: 170 10*3/uL (ref 150.0–400.0)
RBC: 4.4 Mil/uL (ref 3.87–5.11)
RDW: 13.1 % (ref 11.5–14.6)
WBC: 6.7 10*3/uL (ref 4.5–10.5)

## 2012-06-08 LAB — BASIC METABOLIC PANEL
BUN: 15 mg/dL (ref 6–23)
CO2: 27 mEq/L (ref 19–32)
Calcium: 8.8 mg/dL (ref 8.4–10.5)
Chloride: 107 mEq/L (ref 96–112)
Creatinine, Ser: 1.2 mg/dL (ref 0.4–1.2)
GFR: 47.4 mL/min — ABNORMAL LOW (ref >60.00)
Glucose, Bld: 97 mg/dL (ref 70–99)
Potassium: 3.9 mEq/L (ref 3.5–5.1)
Sodium: 139 mEq/L (ref 135–145)

## 2012-06-08 LAB — POCT URINALYSIS DIPSTICK
Bilirubin, UA: NEGATIVE
Glucose, UA: NEGATIVE
Ketones, UA: NEGATIVE
Leukocytes, UA: NEGATIVE
Nitrite, UA: NEGATIVE
Protein, UA: NEGATIVE
Spec Grav, UA: 1.02
Urobilinogen, UA: 0.2
pH, UA: 7

## 2012-06-08 LAB — LIPID PANEL
Cholesterol: 181 mg/dL (ref 0–200)
HDL: 40.6 mg/dL (ref >39.00)
LDL Cholesterol: 107 mg/dL — ABNORMAL HIGH (ref 0–99)
Total CHOL/HDL Ratio: 4
Triglycerides: 166 mg/dL — ABNORMAL HIGH (ref 0.0–149.0)
VLDL: 33.2 mg/dL (ref 0.0–40.0)

## 2012-06-08 MED ORDER — EZETIMIBE 10 MG PO TABS: 10.0000 mg | ORAL_TABLET | Freq: Every day | ORAL | Status: DC

## 2012-06-08 MED ORDER — MIRTAZAPINE 15 MG PO TABS: 15.0000 mg | ORAL_TABLET | Freq: Every day | ORAL | Status: DC

## 2012-06-08 MED ORDER — QUETIAPINE FUMARATE 25 MG PO TABS: 25.0000 mg | ORAL_TABLET | Freq: Every day | ORAL | Status: DC

## 2012-06-08 NOTE — Patient Instructions (Signed)
Continue your current medications  Followup in 1 year sooner if any problems  Stop the Zetia,,,,,,,,,,,,,,, continue diet and exercise program

## 2012-06-08 NOTE — Progress Notes (Signed)
  Subjective:    Patient ID: Jacqueline [name], female    DOB: 1935/05/27, 77 y.o.   MRN: [id number]  HPI [name] is a 77 year old married female nonsmoker who comes in today for a Medicare wellness examination  She takes Prilosec OTC 20 mg daily because of a history of chronic reflux esophagitis  She takes a combination of Remeron 15 mg and Seroquel 25 mg each bedtime for sleep dysfunction. This was originally given to her by her psychiatrist however she and she's clinically well she would like [place] to just refill her medication which we have done in the past.  She had breast cancer left-sided 5 years ago right-sided 2 years ago. She is followed every 6 months by Dr. PRN oncology N. yearly by Dr. Jillyn Hidden her general surgeon.  She gets routine eye care, dental care, BSE monthly, and you mammography, recent colonoscopy showed one polyp  Tetanus 2004, Pneumovax x2, shingles 2005, seasonal flu shot 2013.  She does have some mild hearing loss otherwise review of systems negative   Review of Systems  Constitutional: Negative.   HENT: Negative.   Eyes: Negative.   Respiratory: Negative.   Cardiovascular: Negative.   Gastrointestinal: Negative.   Genitourinary: Negative.   Musculoskeletal: Negative.   Neurological: Negative.   Hematological: Negative.   Psychiatric/Behavioral: Negative.        Objective:   Physical Exam  Constitutional: She appears well-developed and well-nourished.  HENT:  Head: Normocephalic and atraumatic.  Right Ear: External ear normal.  Left Ear: External ear normal.  Nose: Nose normal.  Mouth/Throat: Oropharynx is clear and moist.  Eyes: EOM are normal. Pupils are equal, round, and reactive to light.  Neck: Normal range of motion. Neck supple. No thyromegaly present.  Cardiovascular: Normal rate, regular rhythm, normal heart sounds and intact distal pulses.  Exam reveals no gallop and no friction rub.   No murmur heard. Pulmonary/Chest: Effort normal and  breath sounds normal.  Abdominal: Soft. Bowel sounds are normal. She exhibits no distension and no mass. There is no tenderness. There is no rebound.  Genitourinary:       Bilateral breast exam shows scars from previous surgery otherwise exam normal  Menopause at age 10 D&C a couple years ago for postmenopausal vaginal bleeding which was negative therefore pelvic examination not done since she is asymptomatic  Musculoskeletal: Normal range of motion.  Lymphadenopathy:    She has no cervical adenopathy.  Neurological: She is alert. She has normal reflexes. No cranial nerve deficit. She exhibits normal muscle tone. Coordination normal.  Skin: Skin is warm and dry.  Psychiatric: She has a normal mood and affect. Her behavior is normal. Judgment and thought content normal.          Assessment & Plan:  Healthy female  Sleep dysfunction continue Remeron insert well  Reflux esophagitis continue Prilosec 20 mg daily  Glaucoma continue followup eyedrops with ophthalmologist  History of breast cancer x2 followup with Dr. by mouth every 6 months.  Recent colonoscopy showed one polyp benign

## 2012-06-10 ENCOUNTER — Encounter: Payer: Self-pay | Admitting: Gastroenterology

## 2012-06-12 ENCOUNTER — Telehealth: Payer: Self-pay | Admitting: Family Medicine

## 2012-06-12 DIAGNOSIS — IMO0002 Reserved for concepts with insufficient information to code with codable children: Secondary | ICD-10-CM

## 2012-06-12 NOTE — Telephone Encounter (Signed)
Patient called stating that her refills where called into her local pharmacy and they should have been sent to [facility] also her quetiatine 25mg  1po at bedtime was not called in. The zetia should not have been called in as the MD discontinued it. Mirtazapine 15mg  1po at bedtime should go to [facility]. Please assist.

## 2012-06-15 MED ORDER — OMEPRAZOLE MAGNESIUM 20 MG PO TBEC: 20.0000 mg | DELAYED_RELEASE_TABLET | Freq: Every day | ORAL | Status: AC

## 2012-06-15 MED ORDER — MIRTAZAPINE 15 MG PO TABS: 15.0000 mg | ORAL_TABLET | Freq: Every day | ORAL | Status: DC

## 2012-06-15 MED ORDER — QUETIAPINE FUMARATE 25 MG PO TABS: 25.0000 mg | ORAL_TABLET | Freq: Every day | ORAL | Status: DC

## 2012-06-15 NOTE — Telephone Encounter (Signed)
Rxs sent to [facility]

## 2012-07-03 DIAGNOSIS — H409 Unspecified glaucoma: Secondary | ICD-10-CM | POA: Diagnosis not present

## 2012-07-03 DIAGNOSIS — H52209 Unspecified astigmatism, unspecified eye: Secondary | ICD-10-CM | POA: Diagnosis not present

## 2012-07-03 DIAGNOSIS — H4011X0 Primary open-angle glaucoma, stage unspecified: Secondary | ICD-10-CM | POA: Diagnosis not present

## 2012-08-01 DIAGNOSIS — M75101 Unspecified rotator cuff tear or rupture of right shoulder, not specified as traumatic: Secondary | ICD-10-CM

## 2012-08-01 HISTORY — DX: Unspecified rotator cuff tear or rupture of right shoulder, not specified as traumatic: M75.101

## 2012-08-03 ENCOUNTER — Telehealth: Payer: Self-pay | Admitting: *Deleted

## 2012-08-03 NOTE — Telephone Encounter (Signed)
Jacqueline [name] left a message for the pt on 08/01/12 to return our call so we can reschedule her med onc appt. 

## 2012-08-05 ENCOUNTER — Telehealth: Payer: Self-pay | Admitting: *Deleted

## 2012-08-05 NOTE — Telephone Encounter (Signed)
Pt called and left me a message and I called and left her a message to call me back. 

## 2012-08-06 ENCOUNTER — Telehealth: Payer: Self-pay | Admitting: *Deleted

## 2012-08-06 NOTE — Telephone Encounter (Signed)
Jacqueline [name] called and spoke with patient to reschedule her appt.  Confirmed appt. On 08/13/12 at 2pm. Patient requests Dr. Darnelle Catalan

## 2012-08-07 ENCOUNTER — Encounter: Payer: Self-pay | Admitting: Oncology

## 2012-08-26 DIAGNOSIS — S63509A Unspecified sprain of unspecified wrist, initial encounter: Secondary | ICD-10-CM | POA: Diagnosis not present

## 2012-08-26 DIAGNOSIS — M25579 Pain in unspecified ankle and joints of unspecified foot: Secondary | ICD-10-CM | POA: Diagnosis not present

## 2012-08-26 DIAGNOSIS — M25569 Pain in unspecified knee: Secondary | ICD-10-CM | POA: Diagnosis not present

## 2012-08-26 DIAGNOSIS — M25519 Pain in unspecified shoulder: Secondary | ICD-10-CM | POA: Diagnosis not present

## 2012-08-29 DIAGNOSIS — M19019 Primary osteoarthritis, unspecified shoulder: Secondary | ICD-10-CM | POA: Diagnosis not present

## 2012-08-31 DIAGNOSIS — M719 Bursopathy, unspecified: Secondary | ICD-10-CM | POA: Diagnosis not present

## 2012-08-31 DIAGNOSIS — M67919 Unspecified disorder of synovium and tendon, unspecified shoulder: Secondary | ICD-10-CM | POA: Diagnosis not present

## 2012-09-03 ENCOUNTER — Other Ambulatory Visit: Payer: Medicare Other | Admitting: Lab

## 2012-09-10 ENCOUNTER — Ambulatory Visit: Payer: Medicare Other | Admitting: Oncology

## 2012-09-10 ENCOUNTER — Ambulatory Visit: Payer: Medicare Other | Admitting: Family

## 2012-09-10 ENCOUNTER — Telehealth: Payer: Self-pay | Admitting: *Deleted

## 2012-09-10 NOTE — Telephone Encounter (Signed)
lm appt d/t was given for 09/21/2012@ 10am.

## 2012-09-17 NOTE — Telephone Encounter (Signed)
Patient called asking why she needs to be seen here and see a specialist on March 24th.  Expressed that there is only one appointment with an Advanced Practice Professional, no specialist.  Patient is referring to her new oncologist and would like to wait to see new oncologist because the last time she was here it was a short visit because she is about three years out from surgery.  Expressed that the protocol and continuity of care she needs to be followed as she may be graduating to annual follow up.

## 2012-09-21 ENCOUNTER — Encounter: Payer: Self-pay | Admitting: Family

## 2012-09-21 ENCOUNTER — Ambulatory Visit (HOSPITAL_BASED_OUTPATIENT_CLINIC_OR_DEPARTMENT_OTHER): Payer: Medicare Other | Admitting: Family

## 2012-09-21 ENCOUNTER — Telehealth: Payer: Self-pay | Admitting: *Deleted

## 2012-09-21 VITALS — BP 133/83 | HR 79 | Temp 98.3°F | Resp 20 | Ht 61.0 in | Wt 167.4 lb

## 2012-09-21 DIAGNOSIS — D0511 Intraductal carcinoma in situ of right breast: Secondary | ICD-10-CM

## 2012-09-21 DIAGNOSIS — M949 Disorder of cartilage, unspecified: Secondary | ICD-10-CM

## 2012-09-21 DIAGNOSIS — Z853 Personal history of malignant neoplasm of breast: Secondary | ICD-10-CM

## 2012-09-21 DIAGNOSIS — D0590 Unspecified type of carcinoma in situ of unspecified breast: Secondary | ICD-10-CM

## 2012-09-21 DIAGNOSIS — S43429A Sprain of unspecified rotator cuff capsule, initial encounter: Secondary | ICD-10-CM | POA: Diagnosis not present

## 2012-09-21 DIAGNOSIS — C50419 Malignant neoplasm of upper-outer quadrant of unspecified female breast: Secondary | ICD-10-CM | POA: Diagnosis not present

## 2012-09-21 DIAGNOSIS — Z170 Estrogen receptor positive status [ER+]: Secondary | ICD-10-CM

## 2012-09-21 DIAGNOSIS — M899 Disorder of bone, unspecified: Secondary | ICD-10-CM | POA: Diagnosis not present

## 2012-09-21 NOTE — Patient Instructions (Addendum)
Please contact us at [phone] if you have any questions or concerns. 

## 2012-09-21 NOTE — Progress Notes (Signed)
[facility]  [phone] [phone]  OFFICE PROGRESS NOTE   ID: Jacqueline [name]   DOB: 1935/04/18  MR#: [id number]  JYN#:[id number]   PCP: Evette Georges, MD SU: [name], MD RAD ONCOneita Hurt, MD   HISTORY OF PRESENT ILLNESS: From Dr. Guadalupe Maple New Patient Evaluation Dated 05/28/2006: "This is a pleasant 78 year old woman referred by Dr. Jamey Ripa for evaluation and treatment of breast cancer.  This woman undergoes routine mammography on an annual basis.  She has been in otherwise good health.  An annual screening mammogram was done on 03/31/2006.  This essentially showed a possible mass in the left breast.  Spot compression views and ultrasound was recommended.  This was performed on 04/03/2006.  A palpable mass was not identified in this location.  Essentially spot compression views at the upper quadrant of the left breast showed a small spiculated mass.  Under ultrasound evaluation, there appeared to be a hypoechoic area measuring 8 mm in dimension at 3:30 approximately 5 cm from the nipple.  A biopsy performed on the same day showed this to be an invasive ductal cancer with grade 1/3, a little tubular formation was seen with associated DCIS.  The tumor was ER/PR positive 100% respectively, proliferative index 3%, HER-2 was 1+.  FISH testing was not performed.  An MRI scan was performed subsequently on 04/21/2006.  Essentially this showed a solitary abnormality at the upper outer quadrant of the left breast consistent with no malignancy.  A benign fibroadenoma was seen in the medial portion of the right breast.  The patient elected to undergo lumpectomy with sentinel lymph node evaluation.  Final pathology showed this to be a 0.8 cm grade 2/3 invasive ductal cancer and associated DCIS was seen.  Two sentinel lymph nodes were identified all of which were negative for malignancy.  Surgical margins were clear.  Lymphvascular invasion was  not noted.  I should note that DCIS was present at the inferior margin.  The patient has had an unremarkable postoperative course." Her subsequent history is as detailed below.  INTERVAL HISTORY: Dr. Darnelle Catalan and I saw [name] for follow up of DCIS of the right breast.  Since her last office visit with Dr. Donnie Coffin on 03/13/2012, the patient states that she has been doing well.   The patient is establishing herself with Dr. Darrall Dears service today.  REVIEW OF SYSTEMS: A 10 point review of systems was completed and is negative except for hot flashes and discomfort related to a recent fall and subsequent right rotator cuff tear.  The patient denies any other symptomatology.   PAST MEDICAL HISTORY: Past Medical History  Diagnosis Date  . Hypertension   . Hyperlipidemia   . Ruptured disc, cervical   . Breast lump     rt  . Cancer 2007, 2012    breast- bilateral  . Osteopenia   . Osteoporosis   . Sleep initiation dysfunction   . Failed total left knee replacement   . GERD (gastroesophageal reflux disease)   . Breast cancer hx 2007    stage T1b grade 2 ER/positive,ductal ca left breast  . Breast abscess 07/30/2011    Developed in the mammosite cavity and treated with I&D, packed and healed   . Torn rotator cuff 08/2012     PAST SURGICAL HISTORY: Past Surgical History  Procedure Laterality Date  . Cholecystectomy    . Total knee arthroplasty  05,07  . Dilation and curettage of uterus    .  Kidney stone surgery    . Mastectomy partial / lumpectomy  2007    Left - Dr Jamey Ripa  . Mastectomy partial / lumpectomy  2009    Right Dr Jamey Ripa  . Cataract extraction    . Incise and drain abcess      breast     FAMILY HISTORY Family History  Problem Relation Age of Onset  . Hypertension Mother   . Colon polyps Mother   . Stroke Father   . Cancer Brother     throat  . Colon cancer Neg Hx   . Stomach cancer Neg Hx      GYNECOLOGIC HISTORY: She is gravida 2, para 2,  menarche age 65, postmenopausal age 71.  Hormone replacement therapy x several years, stopped hormone replacement therapy in 2002.  SOCIAL HISTORY: The patient is now retired.  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   They have 2 children, an adult son and an adult daughter; one living in [place] and one in [place].  They also have two grandchildren. The patient likes to read, complete crossword puzzles, and attend church in her spare time.     ADVANCED DIRECTIVES: Not on file   HEALTH MAINTENANCE: History  Substance Use Topics  . Smoking status: Never Smoker   . Smokeless tobacco: Never Used  . Alcohol Use: No    Colonoscopy:  06/03/2012  Repeat in 5 years PAP:  04/23/2010 Bone density: 02/07/2012 showed a T score of -1.4 (osteopenia). Lipid panel:  06/08/2012  Allergies  Allergen Reactions  . Aspirin     REACTION: stomach upset  . Codeine Sulfate     REACTION: stomach upset    Current Outpatient Prescriptions  Medication Sig Dispense Refill  . Calcium Carbonate (CALTRATE 600 PO) Take 600 mg by mouth 2 (two) times daily.      . Cholecalciferol (VITAMIN D3) 2000 UNITS TABS Take by mouth daily.        Marland Kitchen LUMIGAN 0.03 % ophthalmic solution Daily.      . mirtazapine (REMERON) 15 MG tablet Take 1 tablet (15 mg total) by mouth at bedtime.  90 tablet  4  . Multiple Vitamins-Minerals (OCUVITE PO) Take 1 tablet by mouth daily.      Marland Kitchen omeprazole (PRILOSEC OTC) 20 MG tablet Take 1 tablet (20 mg total) by mouth daily.  90 tablet  4  . QUEtiapine (SEROQUEL) 25 MG tablet Take 1 tablet (25 mg total) by mouth at bedtime.  90 tablet  4   No current facility-administered medications for this visit.    OBJECTIVE: Filed Vitals:   09/21/12 1013  BP: 133/83  Pulse: 79  Temp: 98.3 F (36.8 C)  Resp: 20     Body mass index is 31.65 kg/(m^2).      ECOG FS:  Grade 1 - Symptomatic, but  fully ambulatory                 General appearance: Alert, cooperative, well nourished, no apparent distress Head: Normocephalic, without obvious abnormality, atraumatic Eyes: Arcus senilis, PERRLA, EOMI Nose: Nares, septum and mucosa are normal, no drainage or sinus tenderness Neck: No adenopathy, supple, symmetrical, trachea midline, thyroid not enlarged, no tenderness Resp: Clear to auscultation bilaterally Cardio: Regular rate and rhythm, S1, S2 normal, no murmur, click, rub or gallop Breasts: Soft bilaterally, bilateral well-healed surgical scars, no lymphadenopathy, right breast retracted nipple,  no axilla fullness,  radiation changes  GI: Soft, distended, non-tender, hypoactive bowel sounds, no organomegaly Skin: Bilateral upper extremity scattered hyperpigmentation Extremities: Limited ROM in RUE, otherwise atraumatic, no cyanosis or edema Lymph nodes: Cervical, supraclavicular, and axillary nodes normal Neurologic: Grossly normal   LAB RESULTS: Lab Results  Component Value Date   WBC 6.7 06/08/2012   NEUTROABS 4.1 06/08/2012   HGB 13.1 06/08/2012   HCT 39.3 06/08/2012   MCV 89.3 06/08/2012   PLT 170.0 06/08/2012      Chemistry      Component Value Date/Time   NA 139 06/08/2012 1118   NA 143 03/13/2012 1029   K 3.9 06/08/2012 1118   K 3.8 03/13/2012 1029   CL 107 06/08/2012 1118   CL 110* 03/13/2012 1029   CO2 27 06/08/2012 1118   CO2 25 03/13/2012 1029   BUN 15 06/08/2012 1118   BUN 16.0 03/13/2012 1029   CREATININE 1.2 06/08/2012 1118   CREATININE 1.1 03/13/2012 1029      Component Value Date/Time   CALCIUM 8.8 06/08/2012 1118   CALCIUM 8.8 03/13/2012 1029   ALKPHOS 94 06/08/2012 1118   ALKPHOS 109 03/13/2012 1029   AST 23 06/08/2012 1118   AST 17 03/13/2012 1029   ALT 23 06/08/2012 1118   ALT 19 03/13/2012 1029   BILITOT 0.5 06/08/2012 1118   BILITOT 0.30 03/13/2012 1029       Lab Results  Component Value Date   LABCA2 13 02/07/2011    Urinalysis    Component Value  Date/Time   COLORURINE yellow 04/23/2010 0916   APPEARANCEUR Clear 04/23/2010 0916   LABSPEC 1.020 04/23/2010 0916   PHURINE 5.5 04/23/2010 0916   GLUCOSEU NEGATIVE 01/19/2010 1621   HGBUR trace-intact 04/23/2010 0916   HGBUR NEGATIVE 01/19/2010 1621   BILIRUBINUR n 06/08/2012 1235   BILIRUBINUR negative 04/23/2010 0916   KETONESUR NEGATIVE 01/19/2010 1621   PROTEINUR NEGATIVE 01/19/2010 1621   UROBILINOGEN 0.2 06/08/2012 1235   UROBILINOGEN 0.2 04/23/2010 0916   NITRITE n 06/08/2012 1235   NITRITE negative 04/23/2010 0916   LEUKOCYTESUR Negative 06/08/2012 1235    STUDIES: No results found.  ASSESSMENT: 78 y.o. [place], [place] woman:  1. History of prior breast cancer. Status post left breast lumpectomy with sentinel node biopsy on 05/02/2006 for a stage I, T1b N0 (i), invasive ductal carcinoma with DCIS of the left breast, grade 2, ER 100%, PR 100%, Ki-67 3%, HER-2/neu negative, 0/3 positive lymph nodes, status post radiation therapy with MammoSite completed on 06/03/2006, status post 5 years of antiestrogen therapy with Tamoxifen completed in 2012.  2. Status post right breast lumpectomy on 01/30/2011 for a stage 0, Tis, NX MX, right breast DCIS spanning 1.0 cm, low-grade, ER 89%, PR 75%, with clear resection margins, status post radiation therapy with MammoSite from 02/06/2011 through 02/12/2011, the patient declined antiestrogen therapy with DCIS diagnosis.  3. Mammogram on 02/07/2012 showed no mammographic evidence of local recurrence.  4. Bone density scan on 02/07/2012 showed a T score of -1.4 (osteopenia).  5. Hot flashes  6. Rotator cuff tear   PLAN: 1. The patient will be due for another bilateral digital diagnostic mammogram in 01/2013. We plan to see her again in one year (08/2013), and plan to follow her at least annually until the year of 2017.  Dr. Darnelle Catalan will forego collecting laboratories at this time.  2. The patient was encouraged to continue taking  calcium 1200 mg by mouth daily and vitamin D3 2000  IUs by mouth daily for her osteopenia. The patient will be due for another bone density scan in 01/2014.  3. The patient states her hot flashes are manageable and does not want any medication for her occasional hot flashes.  4. The patient is being seen by Orthopedist, Dr. Thurston Hole for her rotator cuff tear.  All questions were answered.  The patient was encouraged to contact us in the interim with any problems, questions or concerns.   Larina Bras, NP-C 09/21/2012, 1:39 PM

## 2012-09-21 NOTE — Telephone Encounter (Signed)
appts made and printed 

## 2012-09-23 ENCOUNTER — Telehealth: Payer: Self-pay | Admitting: *Deleted

## 2012-09-23 NOTE — Telephone Encounter (Signed)
Left message for pt to return my call so I can verify if she is wanting to come in on 09/21/12 to see Dr. Darnelle Catalan.

## 2012-09-24 ENCOUNTER — Telehealth: Payer: Self-pay | Admitting: *Deleted

## 2012-09-24 NOTE — Telephone Encounter (Signed)
Pt called and left me a message.  I called her and left her a message to return my call.

## 2012-11-02 DIAGNOSIS — S43429A Sprain of unspecified rotator cuff capsule, initial encounter: Secondary | ICD-10-CM | POA: Diagnosis not present

## 2013-01-04 ENCOUNTER — Encounter (INDEPENDENT_AMBULATORY_CARE_PROVIDER_SITE_OTHER): Payer: Self-pay | Admitting: Surgery

## 2013-01-04 ENCOUNTER — Ambulatory Visit (INDEPENDENT_AMBULATORY_CARE_PROVIDER_SITE_OTHER): Payer: Medicare Other | Admitting: Surgery

## 2013-01-04 VITALS — BP 146/84 | HR 76 | Temp 98.3°F | Resp 16 | Ht 63.0 in | Wt 166.4 lb

## 2013-01-04 DIAGNOSIS — Z853 Personal history of malignant neoplasm of breast: Secondary | ICD-10-CM | POA: Diagnosis not present

## 2013-01-04 NOTE — Progress Notes (Signed)
NAME: Jacqueline [name]       DOB: 11/11/1934           DATE: 01/04/2013       MRN: [id number]   Jacqueline [name] is a 78 y.o.Marland Kitchenfemale who presents for routine followup of her Bilateral breast cancers diagnosed in 2012 right side and 2007 Left side and treated with Lumpectomies and MammoSite. She has no problems or concerns on either side.she does notice some asymmetry which makes wearing a bra little bit difficult at times.  PFSH: She has had no significant changes since the last visit here.  ROS: There have been no significant changes since the last visit here  EXAM:  VS: BP 146/84  Pulse 76  Temp(Src) 98.3 F (36.8 C) (Temporal)  Resp 16  Ht 5\' 3"  (1.6 m)  Wt 166 lb 6.4 oz (75.479 kg)  BMI 29.48 kg/m2   General: The patient is alert, oriented, generally healthy appearing, NAD. Mood and affect are normal.  Breasts:  Right is s/p lumpectomy and mammosite. There is some loss of tissue at the lumpectomy site and mild tenderness.otherwise breast is unremarkable. The left breast is normal status post lumpectomy radiation.no evidence of local recurrence on either side. Lymphatics: She has no axillary or supraclavicular adenopathy on either side.  Extremities: Full ROM of the surgical side with no lymphedema noted.  Data Reviewed: Mammogram 8/13 Clinical Data: Right lumpectomy, 2012. Left lumpectomy 2007.  Asymptomatic.  DIGITAL DIAGNOSTIC BILATERAL MAMMOGRAM WITH CAD  Comparison: Multiple priors  Findings: There is a fibroglandular pattern. The patient has had  bilateral lumpectomies. No new mass or suspicious calcifications  are seen in either breast. Compared to priors.  Mammographic images were processed with CAD.  IMPRESSION:  No mammographic evidence of local recurrence.  RECOMMENDATION:  Recommend diagnostic mammography in 1 year.  BI-RADS CATEGORY 1: Negative.  Original Report Authenticated By: Hiram Gash, M.D.   Impression: Doing well, with no  evidence of recurrent cancer or new cancer  Plan: We'll see back here on a when necessary basis. I gave her some information on occasions for potential breast prosthesis and bras to help with her asymmetry issue.

## 2013-01-04 NOTE — Patient Instructions (Signed)
We will see you again on an as needed basis. Please call the office at [phone] if you have any questions or concerns. Thank you for allowing us to take care of you.  

## 2013-01-05 DIAGNOSIS — H4011X0 Primary open-angle glaucoma, stage unspecified: Secondary | ICD-10-CM | POA: Diagnosis not present

## 2013-01-05 DIAGNOSIS — H409 Unspecified glaucoma: Secondary | ICD-10-CM | POA: Diagnosis not present

## 2013-01-11 ENCOUNTER — Telehealth (INDEPENDENT_AMBULATORY_CARE_PROVIDER_SITE_OTHER): Payer: Self-pay | Admitting: General Surgery

## 2013-01-11 NOTE — Telephone Encounter (Signed)
Jacqueline [name] with [employer] called for breast prosthesis/bra order form to be faxed. Form complete and in Dr Tenna Child box for signature. Please fax once complete.

## 2013-02-18 ENCOUNTER — Other Ambulatory Visit (INDEPENDENT_AMBULATORY_CARE_PROVIDER_SITE_OTHER): Payer: Self-pay | Admitting: Surgery

## 2013-02-18 DIAGNOSIS — Z853 Personal history of malignant neoplasm of breast: Secondary | ICD-10-CM

## 2013-03-10 ENCOUNTER — Ambulatory Visit
Admission: RE | Admit: 2013-03-10 | Discharge: 2013-03-10 | Disposition: A | Discharge status: Home / Self Care | Payer: Medicare Other | Source: Ambulatory Visit | Attending: Surgery | Admitting: Surgery

## 2013-03-10 DIAGNOSIS — Z853 Personal history of malignant neoplasm of breast: Secondary | ICD-10-CM | POA: Diagnosis not present

## 2013-03-11 ENCOUNTER — Ambulatory Visit: Payer: Medicare Other | Admitting: Oncology

## 2013-04-06 DIAGNOSIS — I781 Nevus, non-neoplastic: Secondary | ICD-10-CM | POA: Diagnosis not present

## 2013-04-06 DIAGNOSIS — L940 Localized scleroderma [morphea]: Secondary | ICD-10-CM | POA: Diagnosis not present

## 2013-04-06 DIAGNOSIS — Z85828 Personal history of other malignant neoplasm of skin: Secondary | ICD-10-CM | POA: Diagnosis not present

## 2013-04-07 DIAGNOSIS — Z23 Encounter for immunization: Secondary | ICD-10-CM | POA: Diagnosis not present

## 2013-04-14 DIAGNOSIS — H43819 Vitreous degeneration, unspecified eye: Secondary | ICD-10-CM | POA: Diagnosis not present

## 2013-06-20 ENCOUNTER — Other Ambulatory Visit: Payer: Self-pay | Admitting: Family Medicine

## 2013-06-30 ENCOUNTER — Encounter: Payer: Self-pay | Admitting: Family Medicine

## 2013-06-30 ENCOUNTER — Ambulatory Visit (INDEPENDENT_AMBULATORY_CARE_PROVIDER_SITE_OTHER): Payer: Medicare Other | Admitting: Family Medicine

## 2013-06-30 VITALS — BP 140/96 | Temp 98.2°F | Ht 62.0 in | Wt 166.0 lb

## 2013-06-30 DIAGNOSIS — Z0000 Encounter for general adult medical examination without abnormal findings: Secondary | ICD-10-CM

## 2013-06-30 DIAGNOSIS — Z853 Personal history of malignant neoplasm of breast: Secondary | ICD-10-CM | POA: Diagnosis not present

## 2013-06-30 DIAGNOSIS — E785 Hyperlipidemia, unspecified: Secondary | ICD-10-CM

## 2013-06-30 DIAGNOSIS — K219 Gastro-esophageal reflux disease without esophagitis: Secondary | ICD-10-CM | POA: Diagnosis not present

## 2013-06-30 DIAGNOSIS — Z23 Encounter for immunization: Secondary | ICD-10-CM | POA: Diagnosis not present

## 2013-06-30 DIAGNOSIS — G43909 Migraine, unspecified, not intractable, without status migrainosus: Secondary | ICD-10-CM

## 2013-06-30 DIAGNOSIS — IMO0002 Reserved for concepts with insufficient information to code with codable children: Secondary | ICD-10-CM

## 2013-06-30 DIAGNOSIS — D0511 Intraductal carcinoma in situ of right breast: Secondary | ICD-10-CM

## 2013-06-30 DIAGNOSIS — I10 Essential (primary) hypertension: Secondary | ICD-10-CM

## 2013-06-30 DIAGNOSIS — D0590 Unspecified type of carcinoma in situ of unspecified breast: Secondary | ICD-10-CM

## 2013-06-30 LAB — CBC WITH DIFFERENTIAL/PLATELET
Basophils Absolute: 0 10*3/uL (ref 0.0–0.1)
Basophils Relative: 0.4 % (ref 0.0–3.0)
Eosinophils Absolute: 0.1 10*3/uL (ref 0.0–0.7)
Eosinophils Relative: 1.7 % (ref 0.0–5.0)
HCT: 41.3 % (ref 36.0–46.0)
Hemoglobin: 13.8 g/dL (ref 12.0–15.0)
Lymphocytes Relative: 33.5 % (ref 12.0–46.0)
Lymphs Abs: 2.7 10*3/uL (ref 0.7–4.0)
MCHC: 33.4 g/dL (ref 30.0–36.0)
MCV: 90.3 fl (ref 78.0–100.0)
Monocytes Absolute: 0.6 10*3/uL (ref 0.1–1.0)
Monocytes Relative: 8.1 % (ref 3.0–12.0)
Neutro Abs: 4.5 10*3/uL (ref 1.4–7.7)
Neutrophils Relative %: 56.3 % (ref 43.0–77.0)
Platelets: 172 10*3/uL (ref 150.0–400.0)
RBC: 4.58 Mil/uL (ref 3.87–5.11)
RDW: 13.5 % (ref 11.5–14.6)
WBC: 7.9 10*3/uL (ref 4.5–10.5)

## 2013-06-30 LAB — BASIC METABOLIC PANEL
BUN: 18 mg/dL (ref 6–23)
CO2: 31 mEq/L (ref 19–32)
Calcium: 9.2 mg/dL (ref 8.4–10.5)
Chloride: 106 mEq/L (ref 96–112)
Creatinine, Ser: 1 mg/dL (ref 0.4–1.2)
GFR: 56.56 mL/min — ABNORMAL LOW (ref >60.00)
Glucose, Bld: 94 mg/dL (ref 70–99)
Potassium: 4.3 mEq/L (ref 3.5–5.1)
Sodium: 141 mEq/L (ref 135–145)

## 2013-06-30 LAB — POCT URINALYSIS DIPSTICK
Bilirubin, UA: NEGATIVE
Blood, UA: NEGATIVE
Glucose, UA: NEGATIVE
Ketones, UA: NEGATIVE
Nitrite, UA: NEGATIVE
Spec Grav, UA: 1.02
Urobilinogen, UA: 0.2
pH, UA: 6

## 2013-06-30 LAB — HEPATIC FUNCTION PANEL
ALT: 22 U/L (ref 0–35)
AST: 17 U/L (ref 0–37)
Albumin: 3.9 g/dL (ref 3.5–5.2)
Alkaline Phosphatase: 91 U/L (ref 39–117)
Bilirubin, Direct: 0 mg/dL (ref 0.0–0.3)
Total Bilirubin: 0.7 mg/dL (ref 0.3–1.2)
Total Protein: 7.2 g/dL (ref 6.0–8.3)

## 2013-06-30 MED ORDER — QUETIAPINE FUMARATE 25 MG PO TABS: ORAL_TABLET | ORAL | Status: DC

## 2013-06-30 MED ORDER — MIRTAZAPINE 15 MG PO TABS: 15.0000 mg | ORAL_TABLET | Freq: Every day | ORAL | Status: DC

## 2013-06-30 NOTE — Progress Notes (Signed)
Pre visit review using our clinic review tool, if applicable. No additional management support is needed unless otherwise documented below in the visit note. 
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
## 2013-06-30 NOTE — Patient Instructions (Signed)
Continue your current medications  Remember to get outside and walk 30 minutes daily  Return in one year sooner if any problems  From my no neck or back pain I would recommend Motrin for her milligrams twice a day with food......... if however he your having symptoms of disc disease contact Dr. Danielle Dess for followup

## 2013-07-28 DIAGNOSIS — H4011X0 Primary open-angle glaucoma, stage unspecified: Secondary | ICD-10-CM | POA: Diagnosis not present

## 2013-07-28 DIAGNOSIS — H409 Unspecified glaucoma: Secondary | ICD-10-CM | POA: Diagnosis not present

## 2013-07-28 DIAGNOSIS — Z961 Presence of intraocular lens: Secondary | ICD-10-CM | POA: Diagnosis not present

## 2013-08-25 DIAGNOSIS — H409 Unspecified glaucoma: Secondary | ICD-10-CM | POA: Diagnosis not present

## 2013-08-25 DIAGNOSIS — H4011X0 Primary open-angle glaucoma, stage unspecified: Secondary | ICD-10-CM | POA: Diagnosis not present

## 2013-09-21 ENCOUNTER — Ambulatory Visit (HOSPITAL_BASED_OUTPATIENT_CLINIC_OR_DEPARTMENT_OTHER): Payer: Medicare Other | Admitting: Oncology

## 2013-09-21 VITALS — BP 148/89 | HR 74 | Temp 97.8°F | Resp 18 | Ht 62.0 in | Wt 168.2 lb

## 2013-09-21 DIAGNOSIS — M899 Disorder of bone, unspecified: Secondary | ICD-10-CM | POA: Diagnosis not present

## 2013-09-21 DIAGNOSIS — Z170 Estrogen receptor positive status [ER+]: Secondary | ICD-10-CM

## 2013-09-21 DIAGNOSIS — Z853 Personal history of malignant neoplasm of breast: Secondary | ICD-10-CM | POA: Diagnosis not present

## 2013-09-21 DIAGNOSIS — D0590 Unspecified type of carcinoma in situ of unspecified breast: Secondary | ICD-10-CM

## 2013-09-21 DIAGNOSIS — R61 Generalized hyperhidrosis: Secondary | ICD-10-CM | POA: Diagnosis not present

## 2013-09-21 DIAGNOSIS — IMO0002 Reserved for concepts with insufficient information to code with codable children: Secondary | ICD-10-CM

## 2013-09-21 DIAGNOSIS — M949 Disorder of cartilage, unspecified: Secondary | ICD-10-CM

## 2013-09-21 DIAGNOSIS — D0510 Intraductal carcinoma in situ of unspecified breast: Secondary | ICD-10-CM

## 2013-09-21 NOTE — Progress Notes (Signed)
[name]  [phone] [phone]  OFFICE PROGRESS NOTE   ID: Jacqueline [name]   DOB: Oct 06, 1934  MR#: [id number]  NOB#:[id number]   PCP: Jacqueline Man, MD SU: Jacqueline Lasso, MD RAD ONCLora Paula, MD   HISTORY OF PRESENT ILLNESS: From Jacqueline [name] New Patient Evaluation Dated 05/28/2006: "This is a pleasant 79 year old woman referred by Jacqueline [name] for evaluation and treatment of breast cancer.  This woman undergoes routine mammography on an annual basis.  She has been in otherwise good health.  An annual screening mammogram was done on 03/31/2006.  This essentially showed a possible mass in the left breast.  Spot compression views and ultrasound was recommended.  This was performed on 04/03/2006.  A palpable mass was not identified in this location.  Essentially spot compression views at the upper quadrant of the left breast showed a small spiculated mass.  Under ultrasound evaluation, there appeared to be a hypoechoic area measuring 8 mm in dimension at 3:30 approximately 5 cm from the nipple.  A biopsy performed on the same day showed this to be an invasive ductal cancer with grade 1/3, a little tubular formation was seen with associated DCIS.  The tumor was ER/PR positive 100% respectively, proliferative index 3%, HER-2 was 1+.  FISH testing was not performed.  An MRI scan was performed subsequently on 04/21/2006.  Essentially this showed a solitary abnormality at the upper outer quadrant of the left breast consistent with no malignancy.  A benign fibroadenoma was seen in the medial portion of the right breast.  The patient elected to undergo lumpectomy with sentinel lymph node evaluation.  Final pathology showed this to be a 0.8 cm grade 2/3 invasive ductal cancer and associated DCIS was seen.  Two sentinel lymph nodes were identified all of which were negative for malignancy.  Surgical margins were clear.  Lymphvascular invasion was  not noted.  I should note that DCIS was present at the inferior margin.  The patient has had an unremarkable postoperative course." Her subsequent history is as detailed below.  INTERVAL HISTORY: Jacqueline [name] returns today for followup of her breast cancer. Interval history is unremarkable. 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 She does not belong to the gym or otherwise take walks.   REVIEW OF SYSTEMS: SHE HAS MILD HEARTBURN SYMPTOMS, FOR WHICH HE TAKES PRILOSEC. SHE BRUISES EASILY. THERE HAVE BEEN NO NEW SYMPTOMS DEVELOPING SINCE THE LAST VISIT HERE. A DETAILED REVIEW OF SYSTEMS TODAY WAS NONCONTRIBUTORY   PAST MEDICAL HISTORY: Past Medical History  Diagnosis Date  . Hypertension   . Hyperlipidemia   . Ruptured disc, cervical   . Breast lump     rt  . Cancer 2007, 2012    breast- bilateral  . Osteopenia   . Osteoporosis   . Sleep initiation dysfunction   . Failed total left knee replacement   . GERD (gastroesophageal reflux disease)   . Breast cancer hx 2007    stage T1b grade 2 ER/positive,ductal ca left breast  . Breast abscess 07/30/2011    Developed in the mammosite cavity and treated with I&D, packed and healed   . Torn rotator cuff 08/2012     PAST SURGICAL HISTORY: Past Surgical History  Procedure Laterality Date  . Cholecystectomy    . Total knee arthroplasty  05,07  . Dilation and curettage of uterus    . Kidney stone surgery    . Mastectomy  partial / lumpectomy  2007    Left - Dr Jacqueline [name]  . Mastectomy partial / lumpectomy  2009    Right Dr Jacqueline [name]  . Cataract extraction    . Incise and drain abcess      breast     FAMILY HISTORY Family History  Problem Relation Age of Onset  . Hypertension Mother   . Colon polyps Mother   . Stroke Father   . Cancer Brother     throat  . Colon cancer Neg Hx   . Stomach cancer Neg Hx      GYNECOLOGIC HISTORY: She is gravida 2, para 2, menarche age 60, postmenopausal age 4.   Hormone replacement therapy x several years, stopped hormone replacement therapy in 2002.  SOCIAL HISTORY: The patient is now retired.  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  She and her husband of 59 years, "Jacqueline Lister" [name], run three antique booths.   They have 2 children, an adult son and an adult daughter; one living in [place] and one in [place].  They also have two grandchildren. The patient likes to read, complete crossword puzzles, and attend church in her spare time.     ADVANCED DIRECTIVES: Not on file   HEALTH MAINTENANCE: History  Substance Use Topics  . Smoking status: Never Smoker   . Smokeless tobacco: Never Used  . Alcohol Use: No    Colonoscopy:  06/03/2012  Repeat in 5 years PAP:  04/23/2010 Bone density: 02/07/2012 showed a T score of -1.4 (osteopenia). Lipid panel:  06/08/2012  Allergies  Allergen Reactions  . Aspirin     REACTION: stomach upset  . Codeine Sulfate     REACTION: stomach upset    Current Outpatient Prescriptions  Medication Sig Dispense Refill  . Calcium Carbonate (CALTRATE 600 PO) Take 600 mg by mouth 2 (two) times daily.      . Cholecalciferol (VITAMIN D3) 2000 UNITS TABS Take by mouth daily.        Marland Kitchen LUMIGAN 0.03 % ophthalmic solution Daily.      . mirtazapine (REMERON) 15 MG tablet Take 1 tablet (15 mg total) by mouth at bedtime.  90 tablet  3  . omeprazole (PRILOSEC OTC) 20 MG tablet Take 1 tablet (20 mg total) by mouth daily.  90 tablet  4  . QUEtiapine (SEROQUEL) 25 MG tablet TAKE 1 TABLET AT BEDTIME  90 tablet  3   No current facility-administered medications for this visit.    OBJECTIVE: middle-aged white woman who appears well  Filed Vitals:   09/21/13 1023  BP: 148/89  Pulse: 74  Temp: 97.8 F (36.6 C)  Resp: 18     Body mass index is 30.76 kg/(m^2).      ECOG FS:  Grade 0but fully ambulatory                 Sclerae unicteric, pupils equal and reactive Oropharynx clear and moist-- no  thrush No cervical or supraclavicular adenopathy Lungs no rales or rhonchi Heart regular rate and rhythm Abd soft, nontender, positive bowel sounds MSK no focal spinal tenderness, no upper extremity lymphedema Neuro: nonfocal, well oriented, appropriate affect Breasts: Status post bilateral lumpectomies and radiation. There is no evidence of local recurrence. Both axillae are benign.    LAB RESULTS: Lab Results  Component Value Date   WBC 7.9 06/30/2013   NEUTROABS 4.5 06/30/2013   HGB 13.8 06/30/2013   HCT 41.3 06/30/2013   MCV 90.3  06/30/2013   PLT 172.0 06/30/2013      Chemistry      Component Value Date/Time   NA 141 06/30/2013 0952   NA 143 03/13/2012 1029   K 4.3 06/30/2013 0952   K 3.8 03/13/2012 1029   CL 106 06/30/2013 0952   CL 110* 03/13/2012 1029   CO2 31 06/30/2013 0952   CO2 25 03/13/2012 1029   BUN 18 06/30/2013 0952   BUN 16.0 03/13/2012 1029   CREATININE 1.0 06/30/2013 0952   CREATININE 1.1 03/13/2012 1029      Component Value Date/Time   CALCIUM 9.2 06/30/2013 0952   CALCIUM 8.8 03/13/2012 1029   ALKPHOS 91 06/30/2013 0952   ALKPHOS 109 03/13/2012 1029   AST 17 06/30/2013 0952   AST 17 03/13/2012 1029   ALT 22 06/30/2013 0952   ALT 19 03/13/2012 1029   BILITOT 0.7 06/30/2013 0952   BILITOT 0.30 03/13/2012 1029       Lab Results  Component Value Date   LABCA2 13 02/07/2011    Urinalysis    Component Value Date/Time   COLORURINE yellow 04/23/2010 0916   APPEARANCEUR Clear 04/23/2010 0916   LABSPEC 1.020 04/23/2010 0916   PHURINE 5.5 04/23/2010 0916   GLUCOSEU NEGATIVE 01/19/2010 1621   HGBUR trace-intact 04/23/2010 0916   HGBUR NEGATIVE 01/19/2010 1621   BILIRUBINUR neg 06/30/2013 1014   BILIRUBINUR negative 04/23/2010 0916   KETONESUR NEGATIVE 01/19/2010 1621   PROTEINUR NEGATIVE 01/19/2010 1621   UROBILINOGEN 0.2 06/30/2013 1014   UROBILINOGEN 0.2 04/23/2010 0916   NITRITE neg 06/30/2013 1014   NITRITE negative 04/23/2010 0916    LEUKOCYTESUR small (1+) 06/30/2013 1014    STUDIES: Mammography August 2014 was unremarkable   ASSESSMENT: 79 y.o. [place], [place] woman:  1. History of prior breast cancer. Status post left breast lumpectomy with sentinel node biopsy on 05/02/2006 for a stage I, T1b N0 (i), invasive ductal carcinoma with DCIS of the left breast, grade 2, ER 100%, PR 100%, Ki-67 3%, HER-2/neu negative, 0/3 positive lymph nodes, status post radiation therapy with MammoSite completed on 06/03/2006, status post 5 years of antiestrogen therapy with Tamoxifen completed in 2012.  2. Status post right breast lumpectomy on 01/30/2011 for a stage 0, Tis, NX MX, right breast DCIS spanning 1.0 cm, low-grade, ER 89%, PR 75%, with clear resection margins, status post radiation therapy with MammoSite from 02/06/2011 through 02/12/2011, the patient declined antiestrogen therapy with DCIS diagnosis.  3. Mammogram on 02/07/2012 showed no mammographic evidence of local recurrence.  4. Bone density scan on 02/07/2012 showed a T score of -1.4 (osteopenia).  5. Hot flashes  6. Rotator cuff tear   PLAN: Daliya is doing just fine as far as her noninvasive breast cancer is concerned. She had a question regarding calcium and currently the standard as I understand it is that vitamin D supplementation is more important than calcium supplementation. If she is having constipation or just difficulty swallowing the calcium pills she is welcome to discontinue those. She does eat yogurt daily.  I have encouraged her to exercise on a regular basis, although she is actually very active in her job.  At this point uncomfortable releasing marker at her primary care physician. She understands all she will need is a yearly mammogram and yearly physician breast exam. I will be glad to see her at any point in the future if the need arises, but as of now no more routine appointments are being made for her here. MAGRINAT,GUSTAV  C, MD    09/21/2013, 10:47 AM

## 2013-10-06 DIAGNOSIS — Z85828 Personal history of other malignant neoplasm of skin: Secondary | ICD-10-CM | POA: Diagnosis not present

## 2013-10-06 DIAGNOSIS — L940 Localized scleroderma [morphea]: Secondary | ICD-10-CM | POA: Diagnosis not present

## 2013-10-06 DIAGNOSIS — L821 Other seborrheic keratosis: Secondary | ICD-10-CM | POA: Diagnosis not present

## 2014-01-05 ENCOUNTER — Other Ambulatory Visit: Payer: Self-pay | Admitting: Dermatology

## 2014-01-05 DIAGNOSIS — C4401 Basal cell carcinoma of skin of lip: Secondary | ICD-10-CM | POA: Diagnosis not present

## 2014-01-05 DIAGNOSIS — D485 Neoplasm of uncertain behavior of skin: Secondary | ICD-10-CM | POA: Diagnosis not present

## 2014-01-05 DIAGNOSIS — L940 Localized scleroderma [morphea]: Secondary | ICD-10-CM | POA: Diagnosis not present

## 2014-01-11 DIAGNOSIS — H409 Unspecified glaucoma: Secondary | ICD-10-CM | POA: Diagnosis not present

## 2014-01-11 DIAGNOSIS — H4011X0 Primary open-angle glaucoma, stage unspecified: Secondary | ICD-10-CM | POA: Diagnosis not present

## 2014-02-08 DIAGNOSIS — C4401 Basal cell carcinoma of skin of lip: Secondary | ICD-10-CM | POA: Diagnosis not present

## 2014-03-29 ENCOUNTER — Other Ambulatory Visit: Payer: Self-pay | Admitting: Family Medicine

## 2014-03-29 DIAGNOSIS — Z853 Personal history of malignant neoplasm of breast: Secondary | ICD-10-CM

## 2014-04-06 ENCOUNTER — Ambulatory Visit
Admission: RE | Admit: 2014-04-06 | Discharge: 2014-04-06 | Disposition: A | Discharge status: Home / Self Care | Payer: Medicare Other | Source: Ambulatory Visit | Attending: Family Medicine | Admitting: Family Medicine

## 2014-04-06 DIAGNOSIS — Z853 Personal history of malignant neoplasm of breast: Secondary | ICD-10-CM

## 2014-04-06 DIAGNOSIS — N6489 Other specified disorders of breast: Secondary | ICD-10-CM | POA: Diagnosis not present

## 2014-04-15 ENCOUNTER — Other Ambulatory Visit: Payer: Self-pay

## 2014-04-20 DIAGNOSIS — Z23 Encounter for immunization: Secondary | ICD-10-CM | POA: Diagnosis not present

## 2014-05-03 DIAGNOSIS — C4401 Basal cell carcinoma of skin of lip: Secondary | ICD-10-CM | POA: Diagnosis not present

## 2014-05-03 DIAGNOSIS — L900 Lichen sclerosus et atrophicus: Secondary | ICD-10-CM | POA: Diagnosis not present

## 2014-06-03 ENCOUNTER — Other Ambulatory Visit: Payer: Self-pay | Admitting: Family Medicine

## 2014-07-12 DIAGNOSIS — Z961 Presence of intraocular lens: Secondary | ICD-10-CM | POA: Diagnosis not present

## 2014-07-12 DIAGNOSIS — H52203 Unspecified astigmatism, bilateral: Secondary | ICD-10-CM | POA: Diagnosis not present

## 2014-07-12 DIAGNOSIS — H4011X2 Primary open-angle glaucoma, moderate stage: Secondary | ICD-10-CM | POA: Diagnosis not present

## 2014-07-27 ENCOUNTER — Ambulatory Visit (INDEPENDENT_AMBULATORY_CARE_PROVIDER_SITE_OTHER): Payer: Medicare Other | Admitting: Family Medicine

## 2014-07-27 ENCOUNTER — Encounter: Payer: Self-pay | Admitting: Family Medicine

## 2014-07-27 VITALS — BP 120/80 | Temp 97.9°F | Ht 62.75 in | Wt 168.0 lb

## 2014-07-27 DIAGNOSIS — Z853 Personal history of malignant neoplasm of breast: Secondary | ICD-10-CM

## 2014-07-27 DIAGNOSIS — G43709 Chronic migraine without aura, not intractable, without status migrainosus: Secondary | ICD-10-CM | POA: Diagnosis not present

## 2014-07-27 DIAGNOSIS — K219 Gastro-esophageal reflux disease without esophagitis: Secondary | ICD-10-CM | POA: Diagnosis not present

## 2014-07-27 LAB — CBC WITH DIFFERENTIAL/PLATELET
Basophils Absolute: 0 10*3/uL (ref 0.0–0.1)
Basophils Relative: 0.5 % (ref 0.0–3.0)
Eosinophils Absolute: 0.1 10*3/uL (ref 0.0–0.7)
Eosinophils Relative: 1 % (ref 0.0–5.0)
HCT: 40.6 % (ref 36.0–46.0)
Hemoglobin: 13.6 g/dL (ref 12.0–15.0)
Lymphocytes Relative: 31.7 % (ref 12.0–46.0)
Lymphs Abs: 2.3 10*3/uL (ref 0.7–4.0)
MCHC: 33.7 g/dL (ref 30.0–36.0)
MCV: 89.8 fl (ref 78.0–100.0)
Monocytes Absolute: 0.5 10*3/uL (ref 0.1–1.0)
Monocytes Relative: 7.6 % (ref 3.0–12.0)
Neutro Abs: 4.2 10*3/uL (ref 1.4–7.7)
Neutrophils Relative %: 59.2 % (ref 43.0–77.0)
Platelets: 164 10*3/uL (ref 150.0–400.0)
RBC: 4.51 Mil/uL (ref 3.87–5.11)
RDW: 12.8 % (ref 11.5–15.5)
WBC: 7.1 10*3/uL (ref 4.0–10.5)

## 2014-07-27 LAB — HEPATIC FUNCTION PANEL
ALT: 17 U/L (ref 0–35)
AST: 16 U/L (ref 0–37)
Albumin: 3.9 g/dL (ref 3.5–5.2)
Alkaline Phosphatase: 84 U/L (ref 39–117)
Bilirubin, Direct: 0.1 mg/dL (ref 0.0–0.3)
Total Bilirubin: 0.4 mg/dL (ref 0.2–1.2)
Total Protein: 7.3 g/dL (ref 6.0–8.3)

## 2014-07-27 LAB — POCT URINALYSIS DIPSTICK
Bilirubin, UA: NEGATIVE
Blood, UA: NEGATIVE
Glucose, UA: NEGATIVE
Ketones, UA: NEGATIVE
Nitrite, UA: NEGATIVE
Spec Grav, UA: 1.025
Urobilinogen, UA: 0.2
pH, UA: 6

## 2014-07-27 LAB — BASIC METABOLIC PANEL
BUN: 20 mg/dL (ref 6–23)
CO2: 26 mEq/L (ref 19–32)
Calcium: 9.1 mg/dL (ref 8.4–10.5)
Chloride: 109 mEq/L (ref 96–112)
Creatinine, Ser: 1.09 mg/dL (ref 0.40–1.20)
GFR: 51.65 mL/min — ABNORMAL LOW (ref >60.00)
Glucose, Bld: 112 mg/dL — ABNORMAL HIGH (ref 70–99)
Potassium: 4 mEq/L (ref 3.5–5.1)
Sodium: 140 mEq/L (ref 135–145)

## 2014-07-27 LAB — TSH: TSH: 3.08 u[IU]/mL (ref 0.35–4.50)

## 2014-07-27 NOTE — Progress Notes (Signed)
Subjective:    Patient ID: Jacqueline [name], female    DOB: [birth year], 80 y.o.   MRN: [id number]  HPI Jacqueline [name] is a 80 year old female nonsmoker who comes in today for yearly evaluation because of a history of breast cancer 2  She had her first primary in her left breast about 10 years ago. She underwent excisional biopsy postop radiation and chemotherapy did well until 5 years ago had a second primary in her right breast. It was DCIS grade 0 and was treated with postop radiation and chemotherapy. She's been released by oncology. However they wanted to have her yearly evaluation  She takes Remeron 15 mg daily at bedtime along with Seroquel 25 mg at bedtime for chronic migraine headaches. These medications were given to her by Dr. Gwenlyn Saran at the headache clinic.  She takes Prilosec OTC for reflux  She takes eyedrops for glaucoma  She gets routine eye care, dental care, BSE monthly, annual mammography, colonoscopy 2013 normal does not need further colonoscopies, home health safety reviewed no issues identified, no guns in the house, she does have a healthcare power of attorney and living well  She finds it difficult to exercise because she has degenerative joint disease. She said both knees replaced and lumbar disc disease. Recommended water aerobics  Vaccinations up-to-date  She has her uterus and ovaries intact. Last pelvic and Pap year ago were normal. Therefore recommend every 3 years since she is asymptomatic.   Review of Systems  Constitutional: Negative.   HENT: Negative.   Eyes: Negative.   Respiratory: Negative.   Cardiovascular: Negative.   Gastrointestinal: Negative.   Endocrine: Negative.   Genitourinary: Negative.   Musculoskeletal: Negative.   Skin: Negative.   Allergic/Immunologic: Negative.   Neurological: Negative.   Hematological: Negative.   Psychiatric/Behavioral: Negative.        Objective:   Physical Exam  Constitutional: She is oriented to  person, place, and time. She appears well-developed and well-nourished.  HENT:  Head: Normocephalic and atraumatic.  Right Ear: External ear normal.  Left Ear: External ear normal.  Nose: Nose normal.  Mouth/Throat: Oropharynx is clear and moist.  Eyes: EOM are normal. Pupils are equal, round, and reactive to light.  Neck: Normal range of motion. Neck supple. No JVD present. No tracheal deviation present. No thyromegaly present.  Cardiovascular: Normal rate, regular rhythm, normal heart sounds and intact distal pulses.  Exam reveals no gallop and no friction rub.   No murmur heard. Pulmonary/Chest: Effort normal and breath sounds normal. No stridor. No respiratory distress. She has no wheezes. She has no rales. She exhibits no tenderness.  Abdominal: Soft. Bowel sounds are normal. She exhibits no distension and no mass. There is no tenderness. There is no rebound and no guarding.  Genitourinary:  Bilateral breast exam shows a well-healed excisional scar left breast at 2:00 and a well-healed incisional car scar right breast 9:00. No palpable masses  Musculoskeletal: Normal range of motion.  Lymphadenopathy:    She has no cervical adenopathy.  Neurological: She is alert and oriented to person, place, and time. She has normal reflexes. No cranial nerve deficit. She exhibits normal muscle tone. Coordination normal.  Skin: Skin is warm and dry. No rash noted. No erythema. No pallor.  Total body skin exam normal except for a growing lesion on her anterior chest wall. Advised return for removal  Psychiatric: She has a normal mood and affect. Her behavior is normal. Judgment and thought content normal.  Nursing note and  vitals reviewed.         Assessment & Plan:  Healthy female  History of breast cancer 2 most recent 5 years ago clinically asymptomatic normal mammograms has been dispensed by the oncology however they want [place] to do annual complete evaluation  Migraine headaches continue  Remeron and Seroquel  Reflux esophagitis Prilosec when necessary  Glaucoma,,,,,,,,, continue eyedrops and follow-up by ophthalmology  New problem of leakage of stool which she has treated with probiotics they cause nausea and now she is on yogurt which seems to help. She can go back to GI for repeat evaluation when necessary  Status post right and left knee replacement  History of lumbar disc disease........Marland Kitchen recommend water aerobics

## 2014-07-27 NOTE — Progress Notes (Signed)
Pre visit review using our clinic review tool, if applicable. No additional management support is needed unless otherwise documented below in the visit note. 

## 2014-07-27 NOTE — Patient Instructions (Signed)
Continue current medications  Follow-up in one year sooner if any problem  I would recommend water aerobics at the [facility]

## 2014-08-09 ENCOUNTER — Other Ambulatory Visit: Payer: Self-pay | Admitting: Family Medicine

## 2014-08-09 ENCOUNTER — Ambulatory Visit: Payer: Medicare Other | Admitting: Family Medicine

## 2014-08-09 DIAGNOSIS — L989 Disorder of the skin and subcutaneous tissue, unspecified: Secondary | ICD-10-CM

## 2014-08-09 DIAGNOSIS — L820 Inflamed seborrheic keratosis: Secondary | ICD-10-CM | POA: Diagnosis not present

## 2014-08-09 NOTE — Progress Notes (Signed)
Pre visit review using our clinic review tool, if applicable. No additional management support is needed unless otherwise documented below in the visit note. 

## 2014-08-28 ENCOUNTER — Other Ambulatory Visit: Payer: Self-pay | Admitting: Family Medicine

## 2014-09-07 DIAGNOSIS — L900 Lichen sclerosus et atrophicus: Secondary | ICD-10-CM | POA: Diagnosis not present

## 2014-11-23 DIAGNOSIS — Z961 Presence of intraocular lens: Secondary | ICD-10-CM | POA: Diagnosis not present

## 2014-11-23 DIAGNOSIS — H4011X2 Primary open-angle glaucoma, moderate stage: Secondary | ICD-10-CM | POA: Diagnosis not present

## 2015-01-12 ENCOUNTER — Encounter: Payer: Self-pay | Admitting: Nurse Practitioner

## 2015-01-12 ENCOUNTER — Telehealth: Payer: Self-pay | Admitting: Family Medicine

## 2015-01-12 DIAGNOSIS — K219 Gastro-esophageal reflux disease without esophagitis: Secondary | ICD-10-CM

## 2015-01-12 NOTE — Telephone Encounter (Signed)
Referral placed.

## 2015-01-12 NOTE — Telephone Encounter (Signed)
Pt said she had discussion with Dr Sherren Mocha back in Jan about  seeing a GI doctor. Pt contacted [facility] and they are requesting a referral .

## 2015-01-27 ENCOUNTER — Ambulatory Visit (INDEPENDENT_AMBULATORY_CARE_PROVIDER_SITE_OTHER): Payer: Medicare Other | Admitting: Nurse Practitioner

## 2015-01-27 ENCOUNTER — Encounter: Payer: Self-pay | Admitting: Nurse Practitioner

## 2015-01-27 VITALS — BP 130/88 | HR 75 | Ht 63.0 in | Wt 164.0 lb

## 2015-01-27 DIAGNOSIS — R32 Unspecified urinary incontinence: Secondary | ICD-10-CM | POA: Diagnosis not present

## 2015-01-27 MED ORDER — HYOSCYAMINE SULFATE 0.125 MG SL SUBL: 0.1250 mg | SUBLINGUAL_TABLET | SUBLINGUAL | Status: DC | PRN

## 2015-01-27 NOTE — Progress Notes (Signed)
     History of Present Illness:   Patient is a 80 year old female known to Dr. Deatra Ina. She had a screening colonoscopies in 2013 with findings of diverticulosis and a small transverse polyp which proved to be adenomatous.  Patient is referred by PCP. She has a longstanding history of diet dependent diarrhea. Patient well aware of trigger foods and stools are pretty normal if she avoids them Her main concern is that she has been some some fecal seepage. This is much more common when stools are loose. No loss of bladder control.  Current Medications, Allergies, Past Medical History, Past Surgical History, Family History and Social History were reviewed in [employer] record.   Physical Exam: General: Pleasant, well developed , white female in no acute distress Head: Normocephalic and atraumatic Eyes:  sclerae anicteric, conjunctiva pink  Ears: Normal auditory acuity Lungs: Clear throughout to auscultation Heart: Regular rate and rhythm Abdomen: Soft, non distended, non-tender. No masses, no hepatomegaly. Normal bowel sounds Musculoskeletal: Symmetrical with no gross deformities  Extremities: No edema  Neurological: Alert oriented x 4, grossly nonfocal Psychological:  Alert and cooperative. Normal mood and affect  Assessment and Recommendations:   80 year old female who longstanding history of diet dependent diarrhea. Patient having fecal seepage when stools are loose. Long discussion about possible etiologies of fecal seepage. It is obviously more difficult to contain stools which are liquid so she will need to avoid trigger foods. I recommended Imodium 30 minutes before meals if patient anticipates being in setting where dietary choices are limited and she may consume of these trigger foods. Patient will contact us for any bowel changes or if fecal seepage continues despite above interventions.

## 2015-01-27 NOTE — Patient Instructions (Signed)
We have sent the following medications to your pharmacy for you to pick up at your convenience:  Ridgely as needed.  Call if you have further concerns.

## 2015-01-29 ENCOUNTER — Encounter: Payer: Self-pay | Admitting: Nurse Practitioner

## 2015-01-30 DIAGNOSIS — R32 Unspecified urinary incontinence: Secondary | ICD-10-CM | POA: Insufficient documentation

## 2015-01-30 NOTE — Progress Notes (Signed)
Reviewed and agree with management.  It may be helpful to add fiber supplements daily to bulk up the stool.  Will refer for breath testing for diarrhea Sandy Salaam. Deatra Ina, M.D., [facility]

## 2015-02-01 ENCOUNTER — Encounter: Payer: Self-pay | Admitting: Podiatry

## 2015-02-01 ENCOUNTER — Ambulatory Visit (INDEPENDENT_AMBULATORY_CARE_PROVIDER_SITE_OTHER): Payer: Medicare Other | Admitting: Podiatry

## 2015-02-01 ENCOUNTER — Ambulatory Visit (INDEPENDENT_AMBULATORY_CARE_PROVIDER_SITE_OTHER): Payer: Medicare Other

## 2015-02-01 VITALS — BP 123/75 | HR 77 | Resp 15

## 2015-02-01 DIAGNOSIS — M79672 Pain in left foot: Secondary | ICD-10-CM

## 2015-02-01 DIAGNOSIS — M2010 Hallux valgus (acquired), unspecified foot: Secondary | ICD-10-CM | POA: Diagnosis not present

## 2015-02-01 DIAGNOSIS — M79671 Pain in right foot: Secondary | ICD-10-CM

## 2015-02-01 DIAGNOSIS — M779 Enthesopathy, unspecified: Secondary | ICD-10-CM

## 2015-02-01 NOTE — Progress Notes (Signed)
   Subjective:    Patient ID: Jacqueline [name], female    DOB: Dec 13, 1934, 80 y.o.   MRN: [id number]  HPI  Pt presents with bilateral numbness and soreness of big toes, several years and ongoing. She has ah/o removal of both hallkux nails  Review of Systems  Neurological: Positive for numbness.  Hematological: Bruises/bleeds easily.  All other systems reviewed and are negative.      Objective:   Physical Exam        Assessment & Plan:

## 2015-02-01 NOTE — Progress Notes (Signed)
Subjective:     Patient ID: Jacqueline [name], female   DOB: Apr 29, 1935, 80 y.o.   MRN: [id number]  HPI patient presents stating I have had discomfort in both feet and soreness in my big toes and I had them removed several years ago and I'm concerned   Review of Systems  All other systems reviewed and are negative.      Objective:   Physical Exam  Constitutional: She is oriented to person, place, and time.  Cardiovascular: Intact distal pulses.   Musculoskeletal: Normal range of motion.  Neurological: She is oriented to person, place, and time.  Skin: Skin is warm.  Nursing note and vitals reviewed.  neurovascular status intact muscle strength adequate range of motion is diminished in the subtalar midtarsal joint. I did note diminishment of sharp dull and vibratory bilateral nature and there is some irritation around the big toes bilateral with no range of motion loss in the first MPJ     Assessment:     Inflammatory changes with possible tendinitis-like symptoms present bilateral    Plan:     Reviewed condition and x-rays and at this time I've recommended softer type shoes wider shoes and cushions area patient will be seen back as needed and was educated on condition at this time

## 2015-02-02 ENCOUNTER — Telehealth: Payer: Self-pay | Admitting: *Deleted

## 2015-02-02 NOTE — Telephone Encounter (Signed)
Called patient at [phone] (cell #) to check to see how they were doing from their ingrown toenail procedure that was done on 02/01/15. Pt stated, "she was doing well and did fine last night". Pt soaked toe and feels good.

## 2015-02-20 ENCOUNTER — Telehealth: Payer: Self-pay | Admitting: Family Medicine

## 2015-02-20 NOTE — Telephone Encounter (Signed)
Pt would like name brand remeron 15 mg #30 only. Send to [facility]. Pt would like to see if name brand works better. Pt read article in papers stating generic does not work as well as name brand

## 2015-02-21 MED ORDER — MIRTAZAPINE 15 MG PO TABS: 15.0000 mg | ORAL_TABLET | Freq: Every day | ORAL | Status: DC

## 2015-03-22 DIAGNOSIS — Z23 Encounter for immunization: Secondary | ICD-10-CM | POA: Diagnosis not present

## 2015-03-22 DIAGNOSIS — L900 Lichen sclerosus et atrophicus: Secondary | ICD-10-CM | POA: Diagnosis not present

## 2015-03-29 DIAGNOSIS — H4011X2 Primary open-angle glaucoma, moderate stage: Secondary | ICD-10-CM | POA: Diagnosis not present

## 2015-03-30 ENCOUNTER — Other Ambulatory Visit: Payer: Self-pay

## 2015-03-30 ENCOUNTER — Other Ambulatory Visit: Payer: Self-pay | Admitting: Family Medicine

## 2015-03-30 DIAGNOSIS — Z9889 Other specified postprocedural states: Secondary | ICD-10-CM

## 2015-04-11 ENCOUNTER — Ambulatory Visit
Admission: RE | Admit: 2015-04-11 | Discharge: 2015-04-11 | Disposition: A | Discharge status: Home / Self Care | Payer: Medicare Other | Source: Ambulatory Visit | Attending: Family Medicine | Admitting: Family Medicine

## 2015-04-11 DIAGNOSIS — R928 Other abnormal and inconclusive findings on diagnostic imaging of breast: Secondary | ICD-10-CM | POA: Diagnosis not present

## 2015-04-11 DIAGNOSIS — Z9889 Other specified postprocedural states: Secondary | ICD-10-CM

## 2015-05-04 ENCOUNTER — Telehealth: Payer: Self-pay | Admitting: Family Medicine

## 2015-05-04 ENCOUNTER — Emergency Department (HOSPITAL_COMMUNITY)
Admission: EM | Admit: 2015-05-04 | Discharge: 2015-05-04 | Disposition: A | Discharge status: Home / Self Care | Payer: Medicare Other | Attending: Emergency Medicine | Admitting: Emergency Medicine

## 2015-05-04 ENCOUNTER — Emergency Department (HOSPITAL_COMMUNITY): Payer: Medicare Other

## 2015-05-04 ENCOUNTER — Encounter (HOSPITAL_COMMUNITY): Payer: Self-pay | Admitting: Emergency Medicine

## 2015-05-04 DIAGNOSIS — Z87442 Personal history of urinary calculi: Secondary | ICD-10-CM | POA: Insufficient documentation

## 2015-05-04 DIAGNOSIS — Z853 Personal history of malignant neoplasm of breast: Secondary | ICD-10-CM | POA: Insufficient documentation

## 2015-05-04 DIAGNOSIS — K219 Gastro-esophageal reflux disease without esophagitis: Secondary | ICD-10-CM | POA: Diagnosis not present

## 2015-05-04 DIAGNOSIS — Z8669 Personal history of other diseases of the nervous system and sense organs: Secondary | ICD-10-CM | POA: Insufficient documentation

## 2015-05-04 DIAGNOSIS — I10 Essential (primary) hypertension: Secondary | ICD-10-CM | POA: Insufficient documentation

## 2015-05-04 DIAGNOSIS — Z8739 Personal history of other diseases of the musculoskeletal system and connective tissue: Secondary | ICD-10-CM | POA: Diagnosis not present

## 2015-05-04 DIAGNOSIS — Z9889 Other specified postprocedural states: Secondary | ICD-10-CM | POA: Insufficient documentation

## 2015-05-04 DIAGNOSIS — Z79899 Other long term (current) drug therapy: Secondary | ICD-10-CM | POA: Diagnosis not present

## 2015-05-04 DIAGNOSIS — R51 Headache: Secondary | ICD-10-CM | POA: Insufficient documentation

## 2015-05-04 DIAGNOSIS — Z8639 Personal history of other endocrine, nutritional and metabolic disease: Secondary | ICD-10-CM | POA: Diagnosis not present

## 2015-05-04 DIAGNOSIS — R42 Dizziness and giddiness: Secondary | ICD-10-CM | POA: Diagnosis present

## 2015-05-04 DIAGNOSIS — R519 Headache, unspecified: Secondary | ICD-10-CM

## 2015-05-04 LAB — URINALYSIS, ROUTINE W REFLEX MICROSCOPIC
Bilirubin Urine: NEGATIVE
Glucose, UA: NEGATIVE mg/dL
Ketones, ur: 15 mg/dL — AB
Nitrite: NEGATIVE
Protein, ur: NEGATIVE mg/dL
Specific Gravity, Urine: 1.017 (ref 1.005–1.030)
Urobilinogen, UA: 0.2 mg/dL (ref 0.0–1.0)
pH: 6 (ref 5.0–8.0)

## 2015-05-04 LAB — CBC
HCT: 41.2 % (ref 36.0–46.0)
Hemoglobin: 13.8 g/dL (ref 12.0–15.0)
MCH: 30.7 pg (ref 26.0–34.0)
MCHC: 33.5 g/dL (ref 30.0–36.0)
MCV: 91.6 fL (ref 78.0–100.0)
Platelets: 156 10*3/uL (ref 150–400)
RBC: 4.5 MIL/uL (ref 3.87–5.11)
RDW: 12.7 % (ref 11.5–15.5)
WBC: 8 10*3/uL (ref 4.0–10.5)

## 2015-05-04 LAB — BASIC METABOLIC PANEL
Anion gap: 9 (ref 5–15)
BUN: 20 mg/dL (ref 6–20)
CO2: 26 mmol/L (ref 22–32)
Calcium: 9.6 mg/dL (ref 8.9–10.3)
Chloride: 106 mmol/L (ref 101–111)
Creatinine, Ser: 1.07 mg/dL — ABNORMAL HIGH (ref 0.44–1.00)
GFR calc Af Amer: 56 mL/min — ABNORMAL LOW (ref >60)
GFR calc non Af Amer: 48 mL/min — ABNORMAL LOW (ref >60)
Glucose, Bld: 96 mg/dL (ref 65–99)
Potassium: 4.2 mmol/L (ref 3.5–5.1)
Sodium: 141 mmol/L (ref 135–145)

## 2015-05-04 LAB — URINE MICROSCOPIC-ADD ON

## 2015-05-04 LAB — CBG MONITORING, ED: Glucose-Capillary: 71 mg/dL (ref 65–99)

## 2015-05-04 MED ORDER — PROCHLORPERAZINE EDISYLATE 5 MG/ML IJ SOLN
10.0000 mg | Freq: Once | INTRAMUSCULAR | Status: AC
  Administered 2015-05-04: 10 mg via INTRAVENOUS
  Filled 2015-05-04: qty 2

## 2015-05-04 NOTE — ED Notes (Signed)
Pt states for 2 days has had a slight headache that feels like a pressure in her head. Last night pt became dizzy and felt lightheaded and noted bp was 180/90. Pt states this afternoon around 1400 pressure in her head became more intense. Pt is alert and ox4. Pt states her head feels "really heavy" right now. No neuro deficits noted.

## 2015-05-04 NOTE — Discharge Instructions (Signed)
General Headache Without Cause °A headache is pain or discomfort felt around the head or neck area. There are many causes and types of headaches. In some cases, the cause may not be found.  °HOME CARE  °Managing Pain °· Take over-the-counter and prescription medicines only as told by your doctor. °· Lie down in a dark, quiet room when you have a headache. °· If directed, apply ice to the head and neck area: °· Put ice in a plastic bag. °· Place a towel between your skin and the bag. °· Leave the ice on for 20 minutes, 2-3 times per day. °· Use a heating pad or hot shower to apply heat to the head and neck area as told by your doctor. °· Keep lights dim if bright lights bother you or make your headaches worse. °Eating and Drinking °· Eat meals on a regular schedule. °· Lessen how much alcohol you drink. °· Lessen how much caffeine you drink, or stop drinking caffeine. °General Instructions °· Keep all follow-up visits as told by your doctor. This is important. °· Keep a journal to find out if certain things bring on headaches. For example, write down: °· What you eat and drink. °· How much sleep you get. °· Any change to your diet or medicines. °· Relax by getting a massage or doing other relaxing activities. °· Lessen stress. °· Sit up straight. Do not tighten (tense) your muscles. °· Do not use tobacco products. This includes cigarettes, chewing tobacco, or e-cigarettes. If you need help quitting, ask your doctor. °· Exercise regularly as told by your doctor. °· Get enough sleep. This often means 7-9 hours of sleep. °GET HELP IF: °· Your symptoms are not helped by medicine. °· You have a headache that feels different than the other headaches. °· You feel sick to your stomach (nauseous) or you throw up (vomit). °· You have a fever. °GET HELP RIGHT AWAY IF:  °· Your headache becomes really bad. °· You keep throwing up. °· You have a stiff neck. °· You have trouble seeing. °· You have trouble speaking. °· You have  pain in the eye or ear. °· Your muscles are weak or you lose muscle control. °· You lose your balance or have trouble walking. °· You feel like you will pass out (faint) or you pass out. °· You have confusion. °  °This information is not intended to replace advice given to you by your health care provider. Make sure you discuss any questions you have with your health care provider. °  °Document Released: 03/26/2008 Document Revised: 03/08/2015 Document Reviewed: 10/10/2014 °Elsevier Interactive Patient Education ©2016 [employer] ° °Hypertension °Hypertension, commonly called high blood pressure, is when the force of blood pumping through your arteries is too strong. Your arteries are the blood vessels that carry blood from your heart throughout your body. A blood pressure reading consists of a higher number over a lower number, such as 110/72. The higher number (systolic) is the pressure inside your arteries when your heart pumps. The lower number (diastolic) is the pressure inside your arteries when your heart relaxes. Ideally you want your blood pressure below 120/80. °Hypertension forces your heart to work harder to pump blood. Your arteries may become narrow or stiff. Having untreated or uncontrolled hypertension can cause heart attack, stroke, kidney disease, and other problems. °RISK FACTORS °Some risk factors for high blood pressure are controllable. Others are not.  °Risk factors you cannot control include:  °· Race. You may   be at higher risk if you are African American. °· Age. Risk increases with age. °· Gender. Men are at higher risk than women before age 45 years. After age 65, women are at higher risk than men. °Risk factors you can control include: °· Not getting enough exercise or physical activity. °· Being overweight. °· Getting too much fat, sugar, calories, or salt in your diet. °· Drinking too much alcohol. °SIGNS AND SYMPTOMS °Hypertension does not usually cause signs or symptoms. Extremely  high blood pressure (hypertensive crisis) may cause headache, anxiety, shortness of breath, and nosebleed. °DIAGNOSIS °To check if you have hypertension, your health care provider will measure your blood pressure while you are seated, with your arm held at the level of your heart. It should be measured at least twice using the same arm. Certain conditions can cause a difference in blood pressure between your right and left arms. A blood pressure reading that is higher than normal on one occasion does not mean that you need treatment. If it is not clear whether you have high blood pressure, you may be asked to return on a different day to have your blood pressure checked again. Or, you may be asked to monitor your blood pressure at home for 1 or more weeks. °TREATMENT °Treating high blood pressure includes making lifestyle changes and possibly taking medicine. Living a healthy lifestyle can help lower high blood pressure. You may need to change some of your habits. °Lifestyle changes may include: °· Following the DASH diet. This diet is high in fruits, vegetables, and whole grains. It is low in salt, red meat, and added sugars. °· Keep your sodium intake below 2,300 mg per day. °· Getting at least 30-45 minutes of aerobic exercise at least 4 times per week. °· Losing weight if necessary. °· Not smoking. °· Limiting alcoholic beverages. °· Learning ways to reduce stress. °Your health care provider may prescribe medicine if lifestyle changes are not enough to get your blood pressure under control, and if one of the following is true: °· You are 18-59 years of age and your systolic blood pressure is above 140. °· You are 60 years of age or older, and your systolic blood pressure is above 150. °· Your diastolic blood pressure is above 90. °· You have diabetes, and your systolic blood pressure is over 140 or your diastolic blood pressure is over 90. °· You have kidney disease and your blood pressure is above  140/90. °· You have heart disease and your blood pressure is above 140/90. °Your personal target blood pressure may vary depending on your medical conditions, your age, and other factors. °HOME CARE INSTRUCTIONS °· Have your blood pressure rechecked as directed by your health care provider.   °· Take medicines only as directed by your health care provider. Follow the directions carefully. Blood pressure medicines must be taken as prescribed. The medicine does not work as well when you skip doses. Skipping doses also puts you at risk for problems. °· Do not smoke.   °· Monitor your blood pressure at home as directed by your health care provider.  °SEEK MEDICAL CARE IF:  °· You think you are having a reaction to medicines taken. °· You have recurrent headaches or feel dizzy. °· You have swelling in your ankles. °· You have trouble with your vision. °SEEK IMMEDIATE MEDICAL CARE IF: °· You develop a severe headache or confusion. °· You have unusual weakness, numbness, or feel faint. °· You have severe chest or abdominal pain. °·   You vomit repeatedly. °· You have trouble breathing. °MAKE SURE YOU:  °· Understand these instructions. °· Will watch your condition. °· Will get help right away if you are not doing well or get worse. °  °This information is not intended to replace advice given to you by your health care provider. Make sure you discuss any questions you have with your health care provider. °  °Document Released: 06/17/2005 Document Revised: 11/01/2014 Document Reviewed: 04/09/2013 °Elsevier Interactive Patient Education ©2016 Elsevier Inc. ° °

## 2015-05-04 NOTE — Telephone Encounter (Signed)
Pt went to the ED 

## 2015-05-04 NOTE — Telephone Encounter (Signed)
Patient Name: Jacqueline [name] DOB: 09/24/1934 Initial Comment Caller states, 188/90 yesterday, then felt dizzy, light headed feeling and headache , she took her BP later 166/70, then later on she has 183/78 and developed light headed feeling and headache -- she takes the BP on her Rt arm Nurse Assessment Nurse: Vallery Sa, RN, [place] Date/Time (Eastern Time): 05/04/2015 2:15:30 PM Confirm and document reason for call. If symptomatic, describe symptoms. ---Caller states her blood pressure yesterday was 188/90 and she felt dizzy. This afternoon she developed dizziness and developed a headache. Her was 183/78 this afternoon. No fever. No injury in the past 3 days. Has the patient traveled out of the country within the last 30 days? ---No Does the patient have any new or worsening symptoms? ---Yes Will a triage be completed? ---Yes Related visit to physician within the last 2 weeks? ---No Does the PT have any chronic conditions? (i.e. diabetes, asthma, etc.) ---Yes List chronic conditions. ---Knee replacements, Migraines Guidelines Guideline Title Affirmed Question Affirmed Notes High Blood Pressure [1] BP # 160 / 100 AND [2] cardiac or neurologic symptoms (e.g., chest pain, difficulty breathing, unsteady gait, blurred vision) dizziness Final Disposition User Go to ED Now Vallery Sa, RN, [facility] - ED Disagree/Comply: Comply

## 2015-05-04 NOTE — ED Provider Notes (Signed)
CSN: [id number]     Arrival date & time 05/04/15  1548 History   First MD Initiated Contact with Patient 05/04/15 2002     Chief Complaint  Patient presents with  . Dizziness   HPI Yesterday she started feeling lightheaded and dizzy.  Her head felt tight.  She checked her BP and it was elevated 180.  This afternoon she felt the sx again including feeling jittery.  She checked her BP and it was elevated.  SHe called her doctor and was told to come to the ED.  She has not been on any blood pressure medications for a while bc her BP got too low.  No trouble with speech or coordination.  No focal numbness or weakness.  No blurred vision. Past Medical History  Diagnosis Date  . Hypertension   . Hyperlipidemia   . Ruptured disc, cervical   . Breast lump     rt  . Cancer ([place]) 2007, 2012    breast- bilateral  . Osteopenia   . Osteoporosis   . Sleep initiation dysfunction   . Failed total left knee replacement ([place])   . GERD (gastroesophageal reflux disease)   . Breast cancer ([place]) hx 2007    stage T1b grade 2 ER/positive,ductal ca left breast  . Breast abscess 07/30/2011    Developed in the mammosite cavity and treated with I&D, packed and healed   . Torn rotator cuff 08/2012  . Glaucoma   . Kidney stones    Past Surgical History  Procedure Laterality Date  . Cholecystectomy    . Total knee arthroplasty  05,07  . Dilation and curettage of uterus    . Kidney stone surgery    . Mastectomy partial / lumpectomy  2007    Left - Dr Margot Chimes  . Mastectomy partial / lumpectomy  2009    Right Dr Margot Chimes  . Cataract extraction    . Incise and drain abcess      breast    Family History  Problem Relation Age of Onset  . Hypertension Mother   . Colon polyps Mother   . Stroke Father   . Cancer Brother     throat  . Colon cancer Neg Hx   . Stomach cancer Neg Hx    Social History  Substance Use Topics  . Smoking status: Never Smoker   . Smokeless tobacco: Never Used  . Alcohol Use: No    OB History    No data available     Review of Systems  Constitutional: Negative for fever.  Respiratory: Negative for cough and shortness of breath.   Neurological: Positive for headaches. Negative for seizures, speech difficulty and light-headedness.  All other systems reviewed and are negative.     Allergies  Aspirin and Codeine sulfate  Home Medications   Prior to Admission medications   Medication Sig Start Date End Date Taking? Authorizing Provider  AMBULATORY NON FORMULARY MEDICATION Take 1 capsule by mouth daily. Medication Name: Tumeric   Yes Historical Provider, MD  Cholecalciferol (VITAMIN D3) 2000 UNITS TABS Take 2,000 Units by mouth daily.    Yes Historical Provider, MD  Cyanocobalamin (VITAMIN B 12 PO) Take 1 capsule by mouth.   Yes Historical Provider, MD  hyoscyamine (LEVSIN SL) 0.125 MG SL tablet Place 1 tablet (0.125 mg total) under the tongue every 4 (four) hours as needed. 01/27/15  Yes Willia Craze, NP  loperamide (IMODIUM) 2 MG capsule Take 2 mg by mouth as needed for diarrhea  or loose stools.   Yes Historical Provider, MD  mirtazapine (REMERON) 15 MG tablet Take 1 tablet (15 mg total) by mouth at bedtime. 02/21/15  Yes Dorena Cookey, MD  omeprazole (PRILOSEC OTC) 20 MG tablet Take 1 tablet (20 mg total) by mouth daily. 06/15/12  Yes Dorena Cookey, MD  QUEtiapine (SEROQUEL) 25 MG tablet TAKE 1 TABLET AT BEDTIME 08/29/14  Yes Dorena Cookey, MD   BP 159/76 mmHg  Pulse 74  Temp(Src) 97.7 F (36.5 C) (Oral)  Resp 9  Ht 5\' 3"  (1.6 m)  Wt 160 lb (72.576 kg)  BMI 28.35 kg/m2  SpO2 100% Physical Exam  Constitutional: She is oriented to person, place, and time. She appears well-developed and well-nourished. No distress.  HENT:  Head: Normocephalic and atraumatic.  Right Ear: External ear normal.  Left Ear: External ear normal.  Mouth/Throat: Oropharynx is clear and moist.  Eyes: Conjunctivae are normal. Right eye exhibits no discharge. Left eye  exhibits no discharge. No scleral icterus.  Neck: Neck supple. No tracheal deviation present.  Cardiovascular: Normal rate, regular rhythm and intact distal pulses.   Pulmonary/Chest: Effort normal and breath sounds normal. No stridor. No respiratory distress. She has no wheezes. She has no rales.  Abdominal: Soft. Bowel sounds are normal. She exhibits no distension. There is no tenderness. There is no rebound and no guarding.  Musculoskeletal: She exhibits no edema or tenderness.  Neurological: She is alert and oriented to person, place, and time. She has normal strength. No cranial nerve deficit (No facial droop, extraocular movements intact, tongue midline ) or sensory deficit. She exhibits normal muscle tone. She displays no seizure activity. Coordination normal.  No pronator drift bilateral upper extrem, able to hold both legs off bed for 5 seconds, sensation intact in all extremities, no visual field cuts, no left or right sided neglect, normal finger-nose exam bilaterally, no nystagmus noted   Skin: Skin is warm and dry. No rash noted.  Psychiatric: She has a normal mood and affect.  Nursing note and vitals reviewed.   ED Course  Procedures (including critical care time) Labs Review Labs Reviewed  BASIC METABOLIC PANEL - Abnormal; Notable for the following:    Creatinine, Ser 1.07 (*)    GFR calc non Af Amer 48 (*)    GFR calc Af Amer 56 (*)    All other components within normal limits  URINALYSIS, ROUTINE W REFLEX MICROSCOPIC (NOT AT [facility]) - Abnormal; Notable for the following:    APPearance CLOUDY (*)    Hgb urine dipstick TRACE (*)    Ketones, ur 15 (*)    Leukocytes, UA LARGE (*)    All other components within normal limits  URINE MICROSCOPIC-ADD ON - Abnormal; Notable for the following:    Bacteria, UA FEW (*)    All other components within normal limits  CBC  CBG MONITORING, ED    Imaging Review Ct Head Wo Contrast  05/04/2015  CLINICAL DATA:  Lightheadedness.  Hypertension. History of breast cancer. EXAM: CT HEAD WITHOUT CONTRAST TECHNIQUE: Contiguous axial images were obtained from the base of the skull through the vertex without intravenous contrast. COMPARISON:  None. FINDINGS: There is no intracranial hemorrhage, mass or evidence of acute infarction. There is mild generalized atrophy. There is mild chronic microvascular ischemic change. There is no significant extra-axial fluid collection. No acute intracranial findings are evident. No bony abnormality is evident. The visible paranasal sinuses are clear. IMPRESSION: Mild generalized atrophy and chronic white matter  changes due to small vessel disease. No acute findings. Electronically Signed   By: Andreas Newport M.D.   On: 05/04/2015 21:20   I have personally reviewed and evaluated these images and lab results as part of my medical decision-making.   EKG Interpretation   Date/Time:  Thursday May 04 2015 16:47:49 EDT Ventricular Rate:  79 PR Interval:  158 QRS Duration: 72 QT Interval:  366 QTC Calculation: 419 R Axis:   46 Text Interpretation:  Normal sinus rhythm Abnormal ECG No significant  change since last tracing Confirmed by Tais Koestner  MD-J, Kelina Beauchamp ([id number]) on  05/04/2015 8:15:50 PM      MDM   Final diagnoses:  Essential hypertension  Nonintractable episodic headache, unspecified headache type   The patient's laboratory tests and CT scans are reassuring.  The patient's urinalysis does show a few bacteria and white blood cells in her urine. Patient however has no urinary symptoms whatsoever. I will send off a urine culture rather than started on antibiotics at this point. He agrees with this plan.  Patient has had trouble with headaches in the past. This possible she's had a mild migraine causing some of the headache.  Her blood pressure was elevated in the emergency room  but not to the extent that I think this would be the source of her headache. Patient had been on blood pressure  medication the past but stopped taking them when she started having low blood pressure.  Her findings are not concerning for acute stroke, acute hemorrhage or other neurologic emergency. I will have her follow-up with her primary doctor to check on her blood pressure. A urine culture was ordered to further assess the abnormal urinalysis.    Dorie Rank, MD 05/04/15 [phone]

## 2015-05-04 NOTE — Telephone Encounter (Signed)
Left message on voicemail to call office on home and mobile checking to see if pt went to ED as instructed.

## 2015-05-05 ENCOUNTER — Telehealth: Payer: Self-pay | Admitting: Family Medicine

## 2015-05-05 NOTE — Telephone Encounter (Signed)
Pt has been reschedule to dr kim on 05-09-15

## 2015-05-05 NOTE — Telephone Encounter (Signed)
Okay to schedule her with another provider

## 2015-05-05 NOTE — Telephone Encounter (Signed)
Pt went to er yesterday dx bp elevated. Pt was not given any bp med and was told to follow up with dr todd. Jacqueline [name] I have sch pt for 11/15 at 11:15 slot. Can I work pt in sooner ?

## 2015-05-09 ENCOUNTER — Ambulatory Visit (INDEPENDENT_AMBULATORY_CARE_PROVIDER_SITE_OTHER): Payer: Medicare Other | Admitting: Family Medicine

## 2015-05-09 ENCOUNTER — Encounter: Payer: Self-pay | Admitting: Family Medicine

## 2015-05-09 VITALS — BP 152/94 | HR 76 | Temp 97.4°F | Ht 63.0 in | Wt 164.7 lb

## 2015-05-09 DIAGNOSIS — I10 Essential (primary) hypertension: Secondary | ICD-10-CM | POA: Diagnosis not present

## 2015-05-09 MED ORDER — AMLODIPINE BESYLATE 2.5 MG PO TABS: 2.5000 mg | ORAL_TABLET | Freq: Every day | ORAL | Status: DC

## 2015-05-09 NOTE — Progress Notes (Signed)
HPI:  Acute visit for:  Elevated Blood pressure: -hx hypertension per chart, but not on any medications for this currently  -on review of chart she has had elevated BP for some time intermittently -reports: had headache last week and found home BP to be high so went to ED 11/3. Her BP was 159/76 there. Per notes had EKG, CT head and labs which were unremarkable except for her UA -denies: any further HA, dizziness, CP, SOB, DOE, swelling, palpitations, dysuria, fevers, malaise -she reports she has a hx of migraines treated by a headache specialist -she reports she took a low dos eof lisinopril in the past for her HTN and it made her BP too low  ROS: See pertinent positives and negatives per HPI.  Past Medical History  Diagnosis Date  . Hypertension   . Hyperlipidemia   . Ruptured disc, cervical   . Breast lump     rt  . Cancer ([place]) 2007, 2012    breast- bilateral  . Osteopenia   . Osteoporosis   . Sleep initiation dysfunction   . Failed total left knee replacement ([place])   . GERD (gastroesophageal reflux disease)   . Breast cancer ([place]) hx 2007    stage T1b grade 2 ER/positive,ductal ca left breast  . Breast abscess 07/30/2011    Developed in the mammosite cavity and treated with I&D, packed and healed   . Torn rotator cuff 08/2012  . Glaucoma   . Kidney stones     Past Surgical History  Procedure Laterality Date  . Cholecystectomy    . Total knee arthroplasty  05,07  . Dilation and curettage of uterus    . Kidney stone surgery    . Mastectomy partial / lumpectomy  2007    Left - Dr Margot Chimes  . Mastectomy partial / lumpectomy  2009    Right Dr Margot Chimes  . Cataract extraction    . Incise and drain abcess      breast     Family History  Problem Relation Age of Onset  . Hypertension Mother   . Colon polyps Mother   . Stroke Father   . Cancer Brother     throat  . Colon cancer Neg Hx   . Stomach cancer Neg Hx     Social History   Social History  . Marital  Status: Married    Spouse Name: N/A  . Number of Children: N/A  . Years of Education: N/A   Social History Main Topics  . Smoking status: Never Smoker   . Smokeless tobacco: Never Used  . Alcohol Use: No  . Drug Use: No  . Sexual Activity: Yes    Birth Control/ Protection: Post-menopausal   Other Topics Concern  . None   Social History Narrative     Current outpatient prescriptions:  .  AMBULATORY NON FORMULARY MEDICATION, Take 1 capsule by mouth daily. Medication Name: Tumeric, Disp: , Rfl:  .  Cholecalciferol (VITAMIN D3) 2000 UNITS TABS, Take 2,000 Units by mouth daily. , Disp: , Rfl:  .  Cyanocobalamin (VITAMIN B 12 PO), Take 1 capsule by mouth., Disp: , Rfl:  .  hyoscyamine (LEVSIN SL) 0.125 MG SL tablet, Place 1 tablet (0.125 mg total) under the tongue every 4 (four) hours as needed., Disp: 30 tablet, Rfl: 2 .  loperamide (IMODIUM) 2 MG capsule, Take 2 mg by mouth as needed for diarrhea or loose stools., Disp: , Rfl:  .  mirtazapine (REMERON) 15 MG tablet, Take 1  tablet (15 mg total) by mouth at bedtime., Disp: 90 tablet, Rfl: 3 .  omeprazole (PRILOSEC OTC) 20 MG tablet, Take 1 tablet (20 mg total) by mouth daily., Disp: 90 tablet, Rfl: 4 .  QUEtiapine (SEROQUEL) 25 MG tablet, TAKE 1 TABLET AT BEDTIME, Disp: 90 tablet, Rfl: 3 .  amLODipine (NORVASC) 2.5 MG tablet, Take 1 tablet (2.5 mg total) by mouth daily., Disp: 30 tablet, Rfl: 1  EXAM:  Filed Vitals:   05/09/15 0852  BP: 152/94  Pulse: 76  Temp: 97.6    Body mass index is 29.18 kg/(m^2).  GENERAL: vitals reviewed and listed above, alert, oriented, appears well hydrated and in no acute distress  HEENT: atraumatic, conjunttiva clear, no obvious abnormalities on inspection of external nose and ears  NECK: no obvious masses on inspection  LUNGS: clear to auscultation bilaterally, no wheezes, rales or rhonchi, good air movement  CV: HRRR, no peripheral edema  MS: moves all extremities without noticeable  abnormality  PSYCH: pleasant and cooperative, no obvious depression or anxiety  ASSESSMENT AND PLAN:  Discussed the following assessment and plan:  Essential hypertension  -discussed options for the treatment of HTN - she decided to try norvasc - she wants to try a very low dose so with will start with 2.5mg , risks discussed -repeat UA and culture if abnormal advised, she declined as reports no urinary symptoms -follow up with PCP in 1 month -Patient advised to return or notify a doctor immediately if symptoms worsen or persist or new concerns arise.  Patient Instructions  BEFORE YOU LEAVE: -schedule follow up in 1 month  Take the norvasc 2.5mg  once daily in the morning  We recommend the following healthy lifestyle measures: - eat a healthy whole foods diet consisting of regular small meals composed of vegetables, fruits, beans, nuts, seeds, healthy meats such as white chicken and fish and whole grains.  - avoid sweets, white starchy foods, fried foods, fast food, processed foods, sodas, red meet and other fattening foods.  - get a least 150-300 minutes of aerobic exercise per week.       Colin Benton R.

## 2015-05-09 NOTE — Patient Instructions (Signed)
BEFORE YOU LEAVE: -schedule follow up in 1 month  Take the norvasc 2.5mg  once daily in the morning  We recommend the following healthy lifestyle measures: - eat a healthy whole foods diet consisting of regular small meals composed of vegetables, fruits, beans, nuts, seeds, healthy meats such as white chicken and fish and whole grains.  - avoid sweets, white starchy foods, fried foods, fast food, processed foods, sodas, red meet and other fattening foods.  - get a least 150-300 minutes of aerobic exercise per week.

## 2015-05-09 NOTE — Progress Notes (Signed)
Pre visit review using our clinic review tool, if applicable. No additional management support is needed unless otherwise documented below in the visit note. 

## 2015-05-16 ENCOUNTER — Ambulatory Visit: Payer: Medicare Other | Admitting: Family Medicine

## 2015-05-30 ENCOUNTER — Telehealth: Payer: Self-pay | Admitting: Family Medicine

## 2015-05-30 MED ORDER — AMLODIPINE BESYLATE 2.5 MG PO TABS: 2.5000 mg | ORAL_TABLET | Freq: Every day | ORAL | Status: DC

## 2015-05-30 NOTE — Telephone Encounter (Signed)
Per  Dr Sherren Mocha patient should continue her amlodipine 2.5 mg.  Her blood pressure should be around 140/90.  She should continue taking her blood pressure and follow up in 2 weeks with Dr Sherren Mocha.  Patient is aware.

## 2015-05-30 NOTE — Telephone Encounter (Signed)
Pt call to say her bp is still running high but she started taking 2 tablets and her bp went down and her headaches went away.  Pt is asking if a rx for 5 mg can be called in of the following med amLODipine (NORVASC) 2.5 MG tablet    [place]

## 2015-05-31 DIAGNOSIS — L900 Lichen sclerosus et atrophicus: Secondary | ICD-10-CM | POA: Diagnosis not present

## 2015-05-31 NOTE — Telephone Encounter (Signed)
error 

## 2015-06-01 ENCOUNTER — Telehealth: Payer: Self-pay | Admitting: Family Medicine

## 2015-06-01 MED ORDER — AMLODIPINE BESYLATE 2.5 MG PO TABS: 2.5000 mg | ORAL_TABLET | Freq: Two times a day (BID) | ORAL | Status: DC

## 2015-06-01 NOTE — Telephone Encounter (Signed)
Rx sent 

## 2015-06-01 NOTE — Telephone Encounter (Signed)
Jacqueline [name] called saying she's out of her BP medication. The pharmacy can't fill her new Rx until Sunday (taking pills once a day). She said a Rx was sent to the pharmacy to be filled for today, Friday, and Saturday but per the Pharmacist the directions need to be changed. On the bottle for three days of pills, the directions need to say 2 pills per day in order for her insurance to cover the cost. She doesn't want to go without taking her pills today through Sunday. She'd like a phone call regarding this.  Pt ph# [phone] Thank you.

## 2015-06-19 ENCOUNTER — Ambulatory Visit (INDEPENDENT_AMBULATORY_CARE_PROVIDER_SITE_OTHER): Payer: Medicare Other | Admitting: Family Medicine

## 2015-06-19 ENCOUNTER — Encounter: Payer: Self-pay | Admitting: Family Medicine

## 2015-06-19 VITALS — Temp 97.6°F | Wt 164.0 lb

## 2015-06-19 DIAGNOSIS — I10 Essential (primary) hypertension: Secondary | ICD-10-CM | POA: Diagnosis not present

## 2015-06-19 DIAGNOSIS — M7581 Other shoulder lesions, right shoulder: Secondary | ICD-10-CM | POA: Diagnosis not present

## 2015-06-19 DIAGNOSIS — Z96653 Presence of artificial knee joint, bilateral: Secondary | ICD-10-CM | POA: Diagnosis not present

## 2015-06-19 MED ORDER — AMLODIPINE BESYLATE 5 MG PO TABS: 5.0000 mg | ORAL_TABLET | Freq: Every day | ORAL | Status: DC

## 2015-06-19 NOTE — Progress Notes (Signed)
   Subjective:    Patient ID: Jacqueline [name], female    DOB: 10/15/1934, 80 y.o.   MRN: [id number]  HPI   Jacqueline [name] is a 80 year old married female nonsmoker who comes in today for follow-up of hypertension  She's been on Norvasc 2.5 mg daily however she says her blood pressure went up and she could tell. Her blood pressure readings on her home machine 140-150 80-90. She then increase it to 5 mg a day but her blood pressure dropped 120/80 range. She said she felt fine at that juncture. She says she wasn't lightheaded when she stood up. We talked about the negative effects of blood pressure being too low syncope broken bones etc. She would like to have her blood pressure lower in the 120/80 range   Review of Systems     Review of systems otherwise negative Objective:   Physical Exam   well-developed well-nourished female no acute distress vital signs stable she's afebrile BP right arm sitting position 140/98 her machine 150/90      Assessment & Plan:   hypertension question goal........ Purchase a new machine........ Norvasc 5 mg daily in the morning........... BP check every morning 6 weeks

## 2015-06-19 NOTE — Patient Instructions (Signed)
Norvasc 5 mg daily in the morning  Check your blood pressure daily in the morning  Purchase a new device......... Omron pump up digital blood pressure cuff   Return in 6 weeks for follow-up with all your blood pressure readings and the new device

## 2015-06-19 NOTE — Progress Notes (Signed)
Pre visit review using our clinic review tool, if applicable. No additional management support is needed unless otherwise documented below in the visit note. 

## 2015-07-31 ENCOUNTER — Ambulatory Visit (INDEPENDENT_AMBULATORY_CARE_PROVIDER_SITE_OTHER): Payer: Medicare Other | Admitting: Family Medicine

## 2015-07-31 ENCOUNTER — Ambulatory Visit: Payer: Medicare Other | Admitting: Family Medicine

## 2015-07-31 ENCOUNTER — Encounter: Payer: Self-pay | Admitting: Family Medicine

## 2015-07-31 VITALS — BP 120/80 | Temp 97.7°F | Wt 168.0 lb

## 2015-07-31 DIAGNOSIS — I10 Essential (primary) hypertension: Secondary | ICD-10-CM | POA: Diagnosis not present

## 2015-07-31 MED ORDER — LOSARTAN POTASSIUM 100 MG PO TABS: 100.0000 mg | ORAL_TABLET | Freq: Every day | ORAL | Status: DC

## 2015-07-31 NOTE — Progress Notes (Signed)
Pre visit review using our clinic review tool, if applicable. No additional management support is needed unless otherwise documented below in the visit note. 

## 2015-07-31 NOTE — Patient Instructions (Signed)
Cozaar 100 mg.......... one half tab daily in the morning check a blood pressure in the morning about an hour after you take your medication  Return in 4-6 weeks for follow-up.........Marland Kitchen bring a record of all your blood pressure readings and the device  Stop the Norvasc

## 2015-07-31 NOTE — Progress Notes (Signed)
   Subjective:    Patient ID: Jacqueline [name], female    DOB: 04-Dec-1934, 81 y.o.   MRN: [id number]  HPI Jacqueline [name] is a 81 year old married female nonsmoker who comes in today for follow-up of hypertension  Her blood pressure been normal for many years without medication. In the fall a blood pressure began to go back up. We started on Norvasc 2.5 mg and then increase the dose to 5 mg because her blood pressures not at goal. Now her blood pressures at goal but she feels fatigue. He didn't start until we increased her dose of Norvasc.  Review of Systems    review of systems otherwise negative Objective:   Physical Exam  Well-developed well-nourished female no acute distress vital signs stable she's afebrile BP right arm sitting position 120/90 and she is not lightheaded when she stands up      Assessment & Plan:  Hypertension at goal however side effects from medication............. discontinue Norvasc......... switch to Cozaar.... BP check daily......... follow-up 4-6 weeks

## 2015-08-01 ENCOUNTER — Encounter: Payer: Medicare Other | Admitting: Family Medicine

## 2015-08-13 ENCOUNTER — Other Ambulatory Visit: Payer: Self-pay | Admitting: Family Medicine

## 2015-08-21 ENCOUNTER — Encounter: Payer: Self-pay | Admitting: Gastroenterology

## 2015-09-13 ENCOUNTER — Ambulatory Visit (INDEPENDENT_AMBULATORY_CARE_PROVIDER_SITE_OTHER): Payer: Medicare Other | Admitting: Family Medicine

## 2015-09-13 ENCOUNTER — Encounter: Payer: Self-pay | Admitting: Family Medicine

## 2015-09-13 VITALS — BP 130/90 | Temp 97.7°F | Wt 167.0 lb

## 2015-09-13 DIAGNOSIS — I10 Essential (primary) hypertension: Secondary | ICD-10-CM | POA: Diagnosis not present

## 2015-09-13 NOTE — Progress Notes (Signed)
Pre visit review using our clinic review tool, if applicable. No additional management support is needed unless otherwise documented below in the visit note. 

## 2015-09-13 NOTE — Patient Instructions (Signed)
Continue your Cozaar 1 tablet daily in the morning  Check your blood pressure once daily in the evening  Follow-up with the folks in [place] in 2-3 months

## 2015-09-13 NOTE — Progress Notes (Signed)
   Subjective:    Patient ID: Jacqueline [name], female    DOB: 1935/05/07, 81 y.o.   MRN: [id number]  HPI [name] is a 81 year old married female nonsmoker comes in today for evaluation of hypertension. We been working with her the past couple months. Her Cozaar dose is now to 100 mg daily. Her blood pressures at home range from 133/72-175/96. I suspect her cuff is malfunctioning. BP left arm today 140/90 by me 130/90 by Apolonio Schneiders    Review of Systems    review of systems negative Objective:   Physical Exam Well-developed well-nourished female no acute distress vital signs stable she's afebrile BP left arm sitting position 130/90  BP right arm very difficult to hear       Assessment & Plan:  Hypertension at goal......... continue current medications continue monitoring blood pressure follow-up with [facility] she wishes to switch

## 2015-09-15 ENCOUNTER — Telehealth: Payer: Self-pay | Admitting: Family Medicine

## 2015-09-15 NOTE — Telephone Encounter (Signed)
Pt states she is wanting to join [employer]. Pt is also wanting to see a physical therapist there first.  [facility] suggested pt  start with her first to get program designed for her specific needs.  Pt needs a recomendation for a fitness program and to use their physical therapist  from her PCP stating it is ok. Medicare requires requires pt to have a visit with her doctor within 30 days. Pt states she saw Dr Sherren Mocha 09/13/15. Pt is hoping she can get this letter faxed to [facility] asap. Pt wants to start PT as soon as she can.   Fax: [phone]

## 2015-09-19 NOTE — Telephone Encounter (Signed)
Pt following up on request for recomendation at [facility] [facility]

## 2015-09-20 NOTE — Telephone Encounter (Signed)
Patient is requesting PT because she has had b/l knee surgery.  Okay to refer?

## 2015-09-20 NOTE — Telephone Encounter (Signed)
Per Dr Sherren Mocha patient should get the note from her ortho.  Patient is aware.

## 2015-10-12 DIAGNOSIS — L900 Lichen sclerosus et atrophicus: Secondary | ICD-10-CM | POA: Diagnosis not present

## 2015-10-12 DIAGNOSIS — L821 Other seborrheic keratosis: Secondary | ICD-10-CM | POA: Diagnosis not present

## 2015-10-25 ENCOUNTER — Telehealth: Payer: Self-pay | Admitting: Family Medicine

## 2015-10-25 NOTE — Telephone Encounter (Signed)
Pt wants to trans care to Dr. Birdie Riddle, [facility] to schedule?

## 2015-10-25 NOTE — Telephone Encounter (Signed)
Agree- ok to schedule

## 2015-10-25 NOTE — Telephone Encounter (Signed)
Ok to schedule.

## 2015-10-27 NOTE — Telephone Encounter (Signed)
Pt has been scheduled.  °

## 2015-11-28 ENCOUNTER — Encounter: Payer: Self-pay | Admitting: Family Medicine

## 2015-11-28 ENCOUNTER — Ambulatory Visit (INDEPENDENT_AMBULATORY_CARE_PROVIDER_SITE_OTHER): Payer: Medicare Other | Admitting: Family Medicine

## 2015-11-28 VITALS — BP 126/84 | HR 77 | Temp 98.0°F | Resp 16 | Ht 63.0 in | Wt 163.5 lb

## 2015-11-28 DIAGNOSIS — I10 Essential (primary) hypertension: Secondary | ICD-10-CM | POA: Diagnosis not present

## 2015-11-28 LAB — BASIC METABOLIC PANEL
BUN: 17 mg/dL (ref 6–23)
CO2: 27 mEq/L (ref 19–32)
Calcium: 8.8 mg/dL (ref 8.4–10.5)
Chloride: 109 mEq/L (ref 96–112)
Creatinine, Ser: 0.98 mg/dL (ref 0.40–1.20)
GFR: 58.2 mL/min — ABNORMAL LOW (ref >60.00)
Glucose, Bld: 94 mg/dL (ref 70–99)
Potassium: 4.6 mEq/L (ref 3.5–5.1)
Sodium: 140 mEq/L (ref 135–145)

## 2015-11-28 LAB — TSH: TSH: 1.7 u[IU]/mL (ref 0.35–4.50)

## 2015-11-28 LAB — HEPATIC FUNCTION PANEL
ALT: 15 U/L (ref 0–35)
AST: 16 U/L (ref 0–37)
Albumin: 3.7 g/dL (ref 3.5–5.2)
Alkaline Phosphatase: 79 U/L (ref 39–117)
Bilirubin, Direct: 0.1 mg/dL (ref 0.0–0.3)
Total Bilirubin: 0.3 mg/dL (ref 0.2–1.2)
Total Protein: 6.5 g/dL (ref 6.0–8.3)

## 2015-11-28 LAB — LIPID PANEL
Cholesterol: 202 mg/dL — ABNORMAL HIGH (ref 0–200)
HDL: 37.5 mg/dL — ABNORMAL LOW (ref >39.00)
LDL Cholesterol: 125 mg/dL — ABNORMAL HIGH (ref 0–99)
NonHDL: 164.75
Total CHOL/HDL Ratio: 5
Triglycerides: 200 mg/dL — ABNORMAL HIGH (ref 0.0–149.0)
VLDL: 40 mg/dL (ref 0.0–40.0)

## 2015-11-28 NOTE — Assessment & Plan Note (Signed)
Chronic problem.  Tolerating Losartan w/o difficulty.  Pt reports she is only taking 1/2 tab daily.  Home BP readings do not appear to be accurate or correlate w/ today's reading.  Prescription given for pt to get new cuff at [facility]. No anticipated med changes.  Will follow.

## 2015-11-28 NOTE — Progress Notes (Signed)
   Subjective:    Patient ID: Jacqueline [name], female    DOB: 25-May-1935, 81 y.o.   MRN: [id number]  HPI New to establish.  Previous MD- Sherren Mocha  HTN- chronic problem, currently on Losartan w/ good BP in office today.  Home BPs range 123-151/72-89.  Pt is not sure if home cuff is accurate.  No CP, SOB, HAs, visual changes, edema.  Exercising regularly- just joined [employer] and has a Physiological scientist.  Has lost 4 lbs since last visit.   Review of Systems For ROS see HPI     Objective:   Physical Exam  Constitutional: She is oriented to person, place, and time. She appears well-developed and well-nourished. No distress.  HENT:  Head: Normocephalic and atraumatic.  Eyes: Conjunctivae and EOM are normal. Pupils are equal, round, and reactive to light.  Neck: Normal range of motion. Neck supple. No thyromegaly present.  Cardiovascular: Normal rate, regular rhythm, normal heart sounds and intact distal pulses.   No murmur heard. Pulmonary/Chest: Effort normal and breath sounds normal. No respiratory distress.  Abdominal: Soft. She exhibits no distension. There is no tenderness.  Musculoskeletal: She exhibits no edema.  Lymphadenopathy:    She has no cervical adenopathy.  Neurological: She is alert and oriented to person, place, and time.  Skin: Skin is warm and dry.  Psychiatric: She has a normal mood and affect. Her behavior is normal.  Vitals reviewed.         Assessment & Plan:

## 2015-11-28 NOTE — Progress Notes (Signed)
Pre visit review using our clinic review tool, if applicable. No additional management support is needed unless otherwise documented below in the visit note. 

## 2015-11-28 NOTE — Patient Instructions (Signed)
Cancel your appt in July and schedule your complete physical in 6 months We'll notify you of your lab results and make any changes if needed Keep up the good work on healthy diet and regular exercise- you look great!!! Go to [facility] and get your new cuff- [id number], [place] Call with any questions or concerns or concerns Welcome!!  We're glad to have you!!! Happy Early Rudene Anda!!!

## 2015-12-04 ENCOUNTER — Telehealth: Payer: Self-pay | Admitting: General Practice

## 2015-12-04 NOTE — Telephone Encounter (Signed)
Will do labs when pt returns- no need to call her back in for this

## 2015-12-04 NOTE — Telephone Encounter (Signed)
-----   Message from Midge Minium, MD sent at 12/04/2015  8:01 AM EDT ----- Everything has resulted except CBC.  Thanks!  kt ----- Message -----    From: SYSTEM    Sent: 12/03/2015  12:05 AM      To: Midge Minium, MD

## 2015-12-04 NOTE — Telephone Encounter (Signed)
Spoke with [name], they advised that they called last week and advised lab person that they had not received this specimen. Jacqueline [name] was waiting to see if it showed up since all other labs were resulted.

## 2016-01-12 ENCOUNTER — Ambulatory Visit: Payer: Medicare Other | Admitting: Family Medicine

## 2016-01-22 ENCOUNTER — Encounter: Payer: Self-pay | Admitting: Family Medicine

## 2016-01-22 ENCOUNTER — Ambulatory Visit (INDEPENDENT_AMBULATORY_CARE_PROVIDER_SITE_OTHER): Payer: Medicare Other | Admitting: Family Medicine

## 2016-01-22 VITALS — BP 150/82 | HR 76 | Temp 98.0°F | Resp 16 | Ht 63.0 in | Wt 163.4 lb

## 2016-01-22 DIAGNOSIS — I10 Essential (primary) hypertension: Secondary | ICD-10-CM | POA: Diagnosis not present

## 2016-01-22 NOTE — Progress Notes (Signed)
   Subjective:    Patient ID: RANDA FELTES, female    DOB: 02/15/1935, 81 y.o.   MRN: [id number]  HPI HTN- pt reports BP has been elevated since her last visit, running 140-160/70s.  Currently on Losartan 100mg  daily- only 1/2 tab daily.  Pt went to eye doctor a few weeks ago and was found to have a ruptured vessel in eye.  Some HAs at top of head upon wakening.  No CP, SOB, edema.   Review of Systems For ROS see HPI     Objective:   Physical Exam  Constitutional: She is oriented to person, place, and time. She appears well-developed and well-nourished. No distress.  HENT:  Head: Normocephalic and atraumatic.  Eyes: Conjunctivae and EOM are normal. Pupils are equal, round, and reactive to light.  Neck: Normal range of motion. Neck supple. No thyromegaly present.  Cardiovascular: Normal rate, regular rhythm, normal heart sounds and intact distal pulses.   No murmur heard. Pulmonary/Chest: Effort normal and breath sounds normal. No respiratory distress.  Abdominal: Soft. She exhibits no distension. There is no tenderness.  Musculoskeletal: She exhibits no edema.  Lymphadenopathy:    She has no cervical adenopathy.  Neurological: She is alert and oriented to person, place, and time.  Skin: Skin is warm and dry.  Psychiatric: She has a normal mood and affect. Her behavior is normal.  Vitals reviewed.         Assessment & Plan:

## 2016-01-22 NOTE — Progress Notes (Signed)
Pre visit review using our clinic review tool, if applicable. No additional management support is needed unless otherwise documented below in the visit note. 

## 2016-01-22 NOTE — Patient Instructions (Signed)
Follow up in 3-4 weeks to recheck BP Increase the Losartan to 1 tab daily Drink plenty of fluids Limit your salt intake to improve your blood Call with any questions or concerns Hang in there!!!

## 2016-01-22 NOTE — Assessment & Plan Note (Signed)
Deteriorated.  Pt's home BPs have recently been elevated and having some symptoms- HA, ruptured blood vessel in eye.  Will increase the Losartan to 1 whole tab daily and monitor closely for improvement.  Pt expressed understanding and is in agreement w/ plan.

## 2016-02-13 ENCOUNTER — Ambulatory Visit: Payer: Medicare Other | Admitting: Family Medicine

## 2016-02-21 ENCOUNTER — Encounter: Payer: Self-pay | Admitting: Family Medicine

## 2016-02-21 ENCOUNTER — Ambulatory Visit (INDEPENDENT_AMBULATORY_CARE_PROVIDER_SITE_OTHER): Payer: Medicare Other | Admitting: Family Medicine

## 2016-02-21 VITALS — BP 156/90 | HR 80 | Temp 98.0°F | Resp 16 | Ht 63.0 in | Wt 162.1 lb

## 2016-02-21 DIAGNOSIS — I10 Essential (primary) hypertension: Secondary | ICD-10-CM | POA: Diagnosis not present

## 2016-02-21 DIAGNOSIS — Z23 Encounter for immunization: Secondary | ICD-10-CM

## 2016-02-21 MED ORDER — VALSARTAN 160 MG PO TABS: 160.0000 mg | ORAL_TABLET | Freq: Every day | ORAL | 3 refills | Status: DC

## 2016-02-21 NOTE — Patient Instructions (Signed)
Follow up in 3-4 weeks to recheck BP STOP the Losartan START the Valsartan once daily Drink plenty of fluids Continue to limit your salt intake Call with any questions or concerns Happy Labor Day!!!

## 2016-02-21 NOTE — Assessment & Plan Note (Signed)
Ongoing issue.  Not well controlled.  Reports she has been having headaches and not feeling well since starting her Losartan and this worsened since increasing to 1 tab daily.  Will switch to Diovan which is more potent and hopefully will get pt to goal.  Reviewed supportive care and red flags that should prompt return.  Pt expressed understanding and is in agreement w/ plan.

## 2016-02-21 NOTE — Progress Notes (Signed)
Pre visit review using our clinic review tool, if applicable. No additional management support is needed unless otherwise documented below in the visit note. 

## 2016-02-21 NOTE — Progress Notes (Signed)
   Subjective:    Patient ID: Jacqueline [name], female    DOB: 1935/01/13, 81 y.o.   MRN: [id number]  HPI HTN- chronic problem.  BP remains elevated despite increasing Losartan to 1 tab daily at last visit.  Home readings range from 134-155/65-81.  Pt reports she is waking w/ HAs and continues to have HAs throughout the day.  'i just don't feel great'.   No CP, SOB, visual changes, edema.  Pt reports she has had difficulty w/ 'head feeling heavy' since switching from the Amlodipine to the Losartan.  Had severe lethargy w/ Amlodipine.   Review of Systems For ROS see HPI     Objective:   Physical Exam  Constitutional: She is oriented to person, place, and time. She appears well-developed and well-nourished. No distress.  HENT:  Head: Normocephalic and atraumatic.  Eyes: Conjunctivae and EOM are normal. Pupils are equal, round, and reactive to light.  Neck: Normal range of motion. Neck supple. No thyromegaly present.  Cardiovascular: Normal rate, regular rhythm, normal heart sounds and intact distal pulses.   No murmur heard. Pulmonary/Chest: Effort normal and breath sounds normal. No respiratory distress.  Abdominal: Soft. She exhibits no distension. There is no tenderness.  Musculoskeletal: She exhibits no edema.  Lymphadenopathy:    She has no cervical adenopathy.  Neurological: She is alert and oriented to person, place, and time.  Skin: Skin is warm and dry.  Psychiatric: She has a normal mood and affect. Her behavior is normal.  Vitals reviewed.         Assessment & Plan:

## 2016-03-18 ENCOUNTER — Ambulatory Visit: Payer: Medicare Other | Admitting: Family Medicine

## 2016-03-19 ENCOUNTER — Ambulatory Visit (INDEPENDENT_AMBULATORY_CARE_PROVIDER_SITE_OTHER): Payer: Medicare Other | Admitting: Family Medicine

## 2016-03-19 ENCOUNTER — Encounter: Payer: Self-pay | Admitting: Family Medicine

## 2016-03-19 VITALS — BP 145/73 | HR 75 | Temp 98.1°F | Resp 16 | Ht 63.0 in | Wt 163.0 lb

## 2016-03-19 DIAGNOSIS — I10 Essential (primary) hypertension: Secondary | ICD-10-CM | POA: Diagnosis not present

## 2016-03-19 LAB — BASIC METABOLIC PANEL
BUN: 17 mg/dL (ref 6–23)
CO2: 30 mEq/L (ref 19–32)
Calcium: 8.9 mg/dL (ref 8.4–10.5)
Chloride: 110 mEq/L (ref 96–112)
Creatinine, Ser: 0.97 mg/dL (ref 0.40–1.20)
GFR: 58.84 mL/min — ABNORMAL LOW (ref >60.00)
Glucose, Bld: 97 mg/dL (ref 70–99)
Potassium: 4.4 mEq/L (ref 3.5–5.1)
Sodium: 143 mEq/L (ref 135–145)

## 2016-03-19 MED ORDER — VALSARTAN-HYDROCHLOROTHIAZIDE 160-12.5 MG PO TABS: 1.0000 | ORAL_TABLET | Freq: Every day | ORAL | 3 refills | Status: DC

## 2016-03-19 NOTE — Assessment & Plan Note (Signed)
Chronic problem.  Better control since switching to Valsartan but pt did not take meds today.  Home BPs are still mildly elevated so will add HCTZ and continue to monitor.  Pt expressed understanding and is in agreement w/ plan.

## 2016-03-19 NOTE — Progress Notes (Signed)
Pre visit review using our clinic review tool, if applicable. No additional management support is needed unless otherwise documented below in the visit note. 

## 2016-03-19 NOTE — Progress Notes (Signed)
   Subjective:    Patient ID: UMA JUNIPER, female    DOB: [birth year], 81 y.o.   MRN: [id number]  HPI HTN- chronic problem.  At last visit, pt was switched from Losartan to Valsartan.  Pt did not take her meds today and reading is only mildly elevated.  Pt is no longer waking up w/ daily HAs.  Pt reports she will have some afternoon sluggish feeling.  No CP, SOB, visual changes, edema.   Review of Systems For ROS see HPI     Objective:   Physical Exam  Constitutional: She is oriented to person, place, and time. She appears well-developed and well-nourished. No distress.  HENT:  Head: Normocephalic and atraumatic.  Eyes: Conjunctivae and EOM are normal. Pupils are equal, round, and reactive to light.  Neck: Normal range of motion. Neck supple. No thyromegaly present.  Cardiovascular: Normal rate, regular rhythm, normal heart sounds and intact distal pulses.   No murmur heard. Pulmonary/Chest: Effort normal and breath sounds normal. No respiratory distress.  Abdominal: Soft. She exhibits no distension. There is no tenderness.  Musculoskeletal: She exhibits no edema.  Lymphadenopathy:    She has no cervical adenopathy.  Neurological: She is alert and oriented to person, place, and time.  Skin: Skin is warm and dry.  Psychiatric: She has a normal mood and affect. Her behavior is normal.  Vitals reviewed.         Assessment & Plan:

## 2016-03-19 NOTE — Patient Instructions (Signed)
Follow up in 4-6 weeks to recheck BP Start the new combination medication- the Valsartan HCTZ daily Keep up the good work on healthy diet and regular exercise- you look great! Call with any questions or concerns Happy Fall!!!

## 2016-03-26 ENCOUNTER — Ambulatory Visit (INDEPENDENT_AMBULATORY_CARE_PROVIDER_SITE_OTHER): Payer: Medicare Other | Admitting: Family Medicine

## 2016-03-26 ENCOUNTER — Encounter: Payer: Self-pay | Admitting: Family Medicine

## 2016-03-26 VITALS — BP 123/64 | HR 68 | Temp 98.1°F | Resp 17 | Ht 63.0 in | Wt 160.4 lb

## 2016-03-26 DIAGNOSIS — I10 Essential (primary) hypertension: Secondary | ICD-10-CM | POA: Diagnosis not present

## 2016-03-26 MED ORDER — VALSARTAN 160 MG PO TABS: 160.0000 mg | ORAL_TABLET | Freq: Every day | ORAL | 6 refills | Status: DC

## 2016-03-26 NOTE — Progress Notes (Signed)
Pre visit review using our clinic review tool, if applicable. No additional management support is needed unless otherwise documented below in the visit note. 

## 2016-03-26 NOTE — Assessment & Plan Note (Signed)
Chronic problem.  Pt has been having hypotensive episodes on the Valsartan HCTZ.  Will switch back to pain Valsartan daily.  Pt expressed understanding and is in agreement w/ plan.

## 2016-03-26 NOTE — Patient Instructions (Signed)
Follow up as scheduled for your physical Switch back to the Valsartan once daily Keep up the good work!  You look great!!! Call with any questions or concerns Happy Fall!!!

## 2016-03-26 NOTE — Progress Notes (Signed)
   Subjective:    Patient ID: Jacqueline [name], female    DOB: 03/23/1935, 81 y.o.   MRN: [id number]  HPI HTN- pt reports that she got lightheaded on Sunday while cooking.  BP was 117/59 at that time.  Felt well yesterday until she was cleaning her house and mid afternoon again felt lightheaded and had a HA and BP was '101/60 something'.  Pt is worried that the Valsartan HCTZ is too strong.  Pt took plain Valsartan today and BP is well controlled.   Review of Systems For ROS see HPI     Objective:   Physical Exam  Constitutional: She is oriented to person, place, and time. She appears well-developed and well-nourished. No distress.  HENT:  Head: Normocephalic and atraumatic.  Eyes: Conjunctivae and EOM are normal. Pupils are equal, round, and reactive to light.  Neck: Normal range of motion. Neck supple. No thyromegaly present.  Cardiovascular: Normal rate, regular rhythm, normal heart sounds and intact distal pulses.   No murmur heard. Pulmonary/Chest: Effort normal and breath sounds normal. No respiratory distress.  Abdominal: Soft. She exhibits no distension. There is no tenderness.  Musculoskeletal: She exhibits no edema.  Lymphadenopathy:    She has no cervical adenopathy.  Neurological: She is alert and oriented to person, place, and time.  Skin: Skin is warm and dry.  Psychiatric: She has a normal mood and affect. Her behavior is normal.  Vitals reviewed.         Assessment & Plan:

## 2016-04-22 ENCOUNTER — Other Ambulatory Visit: Payer: Self-pay | Admitting: Family Medicine

## 2016-04-22 DIAGNOSIS — Z853 Personal history of malignant neoplasm of breast: Secondary | ICD-10-CM

## 2016-04-30 ENCOUNTER — Ambulatory Visit
Admission: RE | Admit: 2016-04-30 | Discharge: 2016-04-30 | Disposition: A | Discharge status: Home / Self Care | Payer: Medicare Other | Source: Ambulatory Visit | Attending: Family Medicine | Admitting: Family Medicine

## 2016-04-30 ENCOUNTER — Other Ambulatory Visit: Payer: Self-pay | Admitting: Family Medicine

## 2016-04-30 DIAGNOSIS — Z853 Personal history of malignant neoplasm of breast: Secondary | ICD-10-CM

## 2016-05-06 ENCOUNTER — Ambulatory Visit
Admission: RE | Admit: 2016-05-06 | Discharge: 2016-05-06 | Disposition: A | Discharge status: Home / Self Care | Payer: Medicare Other | Source: Ambulatory Visit | Attending: Family Medicine | Admitting: Family Medicine

## 2016-05-06 DIAGNOSIS — Z853 Personal history of malignant neoplasm of breast: Secondary | ICD-10-CM

## 2016-05-09 ENCOUNTER — Other Ambulatory Visit: Payer: Self-pay | Admitting: Ophthalmology

## 2016-05-09 DIAGNOSIS — H5340 Unspecified visual field defects: Secondary | ICD-10-CM

## 2016-05-15 ENCOUNTER — Ambulatory Visit: Payer: Self-pay | Admitting: General Surgery

## 2016-05-15 DIAGNOSIS — D0501 Lobular carcinoma in situ of right breast: Secondary | ICD-10-CM

## 2016-05-17 ENCOUNTER — Ambulatory Visit (INDEPENDENT_AMBULATORY_CARE_PROVIDER_SITE_OTHER): Payer: Medicare Other | Admitting: Family Medicine

## 2016-05-17 ENCOUNTER — Encounter: Payer: Self-pay | Admitting: Family Medicine

## 2016-05-17 VITALS — BP 123/70 | HR 71 | Temp 98.1°F | Resp 16 | Ht 63.0 in | Wt 162.2 lb

## 2016-05-17 DIAGNOSIS — M8580 Other specified disorders of bone density and structure, unspecified site: Secondary | ICD-10-CM | POA: Diagnosis not present

## 2016-05-17 DIAGNOSIS — E785 Hyperlipidemia, unspecified: Secondary | ICD-10-CM | POA: Diagnosis not present

## 2016-05-17 DIAGNOSIS — Z0000 Encounter for general adult medical examination without abnormal findings: Secondary | ICD-10-CM

## 2016-05-17 DIAGNOSIS — I10 Essential (primary) hypertension: Secondary | ICD-10-CM

## 2016-05-17 DIAGNOSIS — Z23 Encounter for immunization: Secondary | ICD-10-CM

## 2016-05-17 LAB — CBC WITH DIFFERENTIAL/PLATELET
Basophils Absolute: 0 10*3/uL (ref 0.0–0.1)
Basophils Relative: 0.4 % (ref 0.0–3.0)
Eosinophils Absolute: 0.1 10*3/uL (ref 0.0–0.7)
Eosinophils Relative: 1.3 % (ref 0.0–5.0)
HCT: 42.7 % (ref 36.0–46.0)
Hemoglobin: 14.4 g/dL (ref 12.0–15.0)
Lymphocytes Relative: 29.2 % (ref 12.0–46.0)
Lymphs Abs: 2.3 10*3/uL (ref 0.7–4.0)
MCHC: 33.6 g/dL (ref 30.0–36.0)
MCV: 91.1 fl (ref 78.0–100.0)
Monocytes Absolute: 0.6 10*3/uL (ref 0.1–1.0)
Monocytes Relative: 7.4 % (ref 3.0–12.0)
Neutro Abs: 4.8 10*3/uL (ref 1.4–7.7)
Neutrophils Relative %: 61.7 % (ref 43.0–77.0)
Platelets: 176 10*3/uL (ref 150.0–400.0)
RBC: 4.69 Mil/uL (ref 3.87–5.11)
RDW: 12.7 % (ref 11.5–15.5)
WBC: 7.8 10*3/uL (ref 4.0–10.5)

## 2016-05-17 LAB — LIPID PANEL
Cholesterol: 223 mg/dL — ABNORMAL HIGH (ref 0–200)
HDL: 44.4 mg/dL (ref >39.00)
LDL Cholesterol: 141 mg/dL — ABNORMAL HIGH (ref 0–99)
NonHDL: 178.34
Total CHOL/HDL Ratio: 5
Triglycerides: 185 mg/dL — ABNORMAL HIGH (ref 0.0–149.0)
VLDL: 37 mg/dL (ref 0.0–40.0)

## 2016-05-17 LAB — HEPATIC FUNCTION PANEL
ALT: 18 U/L (ref 0–35)
AST: 17 U/L (ref 0–37)
Albumin: 4 g/dL (ref 3.5–5.2)
Alkaline Phosphatase: 79 U/L (ref 39–117)
Bilirubin, Direct: 0.1 mg/dL (ref 0.0–0.3)
Total Bilirubin: 0.5 mg/dL (ref 0.2–1.2)
Total Protein: 7.1 g/dL (ref 6.0–8.3)

## 2016-05-17 LAB — BASIC METABOLIC PANEL
BUN: 19 mg/dL (ref 6–23)
CO2: 28 mEq/L (ref 19–32)
Calcium: 9.3 mg/dL (ref 8.4–10.5)
Chloride: 110 mEq/L (ref 96–112)
Creatinine, Ser: 1 mg/dL (ref 0.40–1.20)
GFR: 56.79 mL/min — ABNORMAL LOW (ref >60.00)
Glucose, Bld: 105 mg/dL — ABNORMAL HIGH (ref 70–99)
Potassium: 4.2 mEq/L (ref 3.5–5.1)
Sodium: 142 mEq/L (ref 135–145)

## 2016-05-17 LAB — VITAMIN D 25 HYDROXY (VIT D DEFICIENCY, FRACTURES): VITD: 17.45 ng/mL — ABNORMAL LOW (ref 30.00–100.00)

## 2016-05-17 LAB — TSH: TSH: 2.36 u[IU]/mL (ref 0.35–4.50)

## 2016-05-17 NOTE — Assessment & Plan Note (Signed)
Chronic problem.  Excellent control.  Asymptomatic today.  Check labs.  No anticipated med changes.

## 2016-05-17 NOTE — Progress Notes (Signed)
Pre visit review using our clinic review tool, if applicable. No additional management support is needed unless otherwise documented below in the visit note. 

## 2016-05-17 NOTE — Patient Instructions (Addendum)
Follow up in 6 months to recheck BP and cholesterol- sooner if needed We'll notify you of your lab results and make any changes if needed Continue to work on healthy diet and regular exercise- you look great! Please have your specialists send me copies of their notes so I can follow along You do not need another colonoscopy unless there is an issue You are now up to date on your immunizations- yay! Make sure you are drinking plenty of fluids- this will help with the headaches Call with any questions or concerns Happy Holidays!

## 2016-05-17 NOTE — Assessment & Plan Note (Signed)
Chronic problem.  Overdue for DEXA.  Order entered.  Check Vit D.  Will follow.

## 2016-05-17 NOTE — Assessment & Plan Note (Signed)
Pt's PE WNL.  UTD on mammo, colonoscopy.  Due for DEXA- order entered.  Pneumovax updated.  Written screening schedule updated and given to pt.  Check labs.  Anticipatory guidance provided.

## 2016-05-17 NOTE — Addendum Note (Signed)
Addended by: Davis Gourd on: 05/17/2016 09:41 AM   Modules accepted: Orders

## 2016-05-17 NOTE — Assessment & Plan Note (Signed)
Chronic problem.  Attempting to control w/ healthy diet and regular exercise.  Check labs.  Start meds prn. 

## 2016-05-17 NOTE — Progress Notes (Signed)
   Subjective:    Patient ID: Jacqueline [name], female    DOB: December 23, 1934, 81 y.o.   MRN: [id number]  HPI Here today for CPE.  Risk Factors: HTN- chronic problem, on Valsartan Hyperlipidemia- chronic problem, attempting to control w/ healthy diet and regular exercise.  Not on meds  Physical Activity: walking regularly Fall Risk: low risk Depression: denies current sxs Hearing: normal to conversational tones and whispered voice at 6 ft ADL's: independent Cognitive: normal linear thought process, memory and attention intact Home Safety: safe at home Height, Weight, BMI, Visual Acuity: see vitals, vision corrected to 20/20 w/ cataract surgery- following w/ retinal specialist for R eye hemorrhage Counseling: UTD on colonoscopy until 2018 and pt declines at this time, mammo.  Due for Pneumovax Care team reviewed and updated w/ pt Labs Ordered: See A&P Care Plan: See A&P    Review of Systems Patient reports no hearing changes, adenopathy,fever, weight change,  persistant/recurrent hoarseness , swallowing issues, chest pain, palpitations, edema, persistant/recurrent cough, hemoptysis, dyspnea (rest/exertional/paroxysmal nocturnal), gastrointestinal bleeding (melena, rectal bleeding), abdominal pain, significant heartburn, bowel changes, GU symptoms (dysuria, hematuria, incontinence), Gyn symptoms (abnormal  bleeding, pain),  syncope, focal weakness, memory loss, numbness & tingling, skin/hair/nail changes, abnormal bruising or bleeding, anxiety, or depression.   + visual changes- Dr Ellie Lunch has MRI scheduled for Monday    Objective:   Physical Exam General Appearance:    Alert, cooperative, no distress, appears stated age  Head:    Normocephalic, without obvious abnormality, atraumatic  Eyes:    PERRL, conjunctiva/corneas clear, EOM's intact, fundi    benign, both eyes  Ears:    Normal TM's and external ear canals, both ears  Nose:   Nares normal, septum midline, mucosa normal, no  drainage    or sinus tenderness  Throat:   Lips, mucosa, and tongue normal; teeth and gums normal  Neck:   Supple, symmetrical, trachea midline, no adenopathy;    Thyroid: no enlargement/tenderness/nodules  Back:     Symmetric, no curvature, ROM normal, no CVA tenderness  Lungs:     Clear to auscultation bilaterally, respirations unlabored  Chest Wall:    No tenderness or deformity   Heart:    Regular rate and rhythm, S1 and S2 normal, no murmur, rub   or gallop  Breast Exam:    Deferred to mammo  Abdomen:     Soft, non-tender, bowel sounds active all four quadrants,    no masses, no organomegaly  Genitalia:    Deferred  Rectal:    Extremities:   Extremities normal, atraumatic, no cyanosis or edema  Pulses:   2+ and symmetric all extremities  Skin:   Skin color, texture, turgor normal, no rashes or lesions  Lymph nodes:   Cervical, supraclavicular, and axillary nodes normal  Neurologic:   CNII-XII intact, normal strength, sensation and reflexes    throughout          Assessment & Plan:

## 2016-05-20 ENCOUNTER — Ambulatory Visit (INDEPENDENT_AMBULATORY_CARE_PROVIDER_SITE_OTHER): Payer: Medicare Other | Admitting: Family Medicine

## 2016-05-20 ENCOUNTER — Other Ambulatory Visit: Payer: Self-pay | Admitting: General Practice

## 2016-05-20 ENCOUNTER — Ambulatory Visit
Admission: RE | Admit: 2016-05-20 | Discharge: 2016-05-20 | Disposition: A | Discharge status: Home / Self Care | Payer: Medicare Other | Source: Ambulatory Visit | Attending: Ophthalmology | Admitting: Ophthalmology

## 2016-05-20 ENCOUNTER — Encounter: Payer: Self-pay | Admitting: Family Medicine

## 2016-05-20 VITALS — BP 120/76 | HR 73 | Temp 98.1°F | Resp 16 | Ht 63.0 in | Wt 163.1 lb

## 2016-05-20 DIAGNOSIS — H5340 Unspecified visual field defects: Secondary | ICD-10-CM

## 2016-05-20 DIAGNOSIS — S0993XA Unspecified injury of face, initial encounter: Secondary | ICD-10-CM | POA: Diagnosis not present

## 2016-05-20 MED ORDER — VITAMIN D (ERGOCALCIFEROL) 1.25 MG (50000 UNIT) PO CAPS: 50000.0000 [IU] | ORAL_CAPSULE | ORAL | 0 refills | Status: DC

## 2016-05-20 MED ORDER — GADOBENATE DIMEGLUMINE 529 MG/ML IV SOLN
14.0000 mL | Freq: Once | INTRAVENOUS | Status: AC | PRN
  Administered 2016-05-20: 14 mL via INTRAVENOUS

## 2016-05-20 NOTE — Progress Notes (Signed)
Pre visit review using our clinic review tool, if applicable. No additional management support is needed unless otherwise documented below in the visit note. 

## 2016-05-20 NOTE — Patient Instructions (Signed)
Follow up as needed Thankfully, there don't appear to be any fractures from your fall The bruising will go away but it will look pretty scary for a week or so Alternate warm and cool compresses to the face to improve swelling and bruising Take tylenol as needed for pain REST! Call with any questions or concerns Hang in there! Happy Holidays!!!

## 2016-05-20 NOTE — Progress Notes (Signed)
   Subjective:    Patient ID: Jacqueline [name], female    DOB: 1935/05/18, 81 y.o.   MRN: [id number]  HPI Fall at home- occurred Saturday AM at 1:30 when pt was unable to sleep and she got up to go read in the other room.  She is not exactly sure what happened when she was walking down the hallway and pitched forward, hitting molding of door frame.  Denies passing out or LOC.  Pt reports she had a knot immediately on her L forehead.  HA improved w/ tylenol and has not had any pain or HA since time of incident.  Saturday had minimal bruising but yesterday L eye was swollen and extensively bruised.  This AM had bruising around R eye.  Still denies pain.  No blurry or double vision.  No nasal pain.  No dizziness.  No N/V.  No confusion   Review of Systems For ROS see HPI     Objective:   Physical Exam  Constitutional: She is oriented to person, place, and time. She appears well-developed and well-nourished. No distress.  HENT:  Head: Normocephalic.  Extensive facial bruising over L forehead and eyes bilaterally (raccoon eyes), sparing nasal bridge and nose.  L frontal bone and orbital ridge intact w/o depression or step off  Eyes: Conjunctivae and EOM are normal. Pupils are equal, round, and reactive to light.  Neurological: She is alert and oriented to person, place, and time. No cranial nerve deficit. Coordination normal.  Skin: Skin is warm and dry.  Psychiatric: She has a normal mood and affect. Her behavior is normal. Thought content normal.  Vitals reviewed.         Assessment & Plan:  Facial trauma- new.  Pt fell in the early hours of Saturday striking her L frontal bone.  No obvious fractures or bony abnormalities.  Pt has bilateral raccoon eyes w/ impressive bruising but no pain.  No need for imaging at this time.  Reviewed supportive care and red flags that should prompt return.  Pt expressed understanding and is in agreement w/ plan.

## 2016-05-22 ENCOUNTER — Other Ambulatory Visit: Payer: Self-pay | Admitting: General Surgery

## 2016-05-22 DIAGNOSIS — D0501 Lobular carcinoma in situ of right breast: Secondary | ICD-10-CM

## 2016-05-30 ENCOUNTER — Telehealth: Payer: Self-pay | Admitting: Family Medicine

## 2016-05-30 NOTE — Telephone Encounter (Signed)
Patient Name: Jacqueline [name]  DOB: 1934/10/22    Initial Comment Caller states that she has been vomiting, diarrhea, and headache.    Nurse Assessment  Nurse: Christel Mormon, RN, Levada Dy Date/Time Eilene Ghazi Time): 05/30/2016 4:01:16 PM  Confirm and document reason for call. If symptomatic, describe symptoms. ---caller started with vomiting and diarrhea at 4am, last vomit was an hour ago and she also urinated at that time. The diarrhea has sometimes been uncontrollable. She takes Lomotil routinely but has not taken any today due to the vomiting.  Does the patient have any new or worsening symptoms? ---Yes  Will a triage be completed? ---Yes  Related visit to physician within the last 2 weeks? ---Yes  Does the PT have any chronic conditions? (i.e. diabetes, asthma, etc.) ---Yes  List chronic conditions. ---GERD, HTN, hx breast cancer, migraines  Is this a behavioral health or substance abuse call? ---No     Guidelines    Guideline Title Affirmed Question Affirmed Notes  Vomiting [1] MODERATE vomiting (e.g., 3 - 5 times/day) AND [2] age > 29    Final Disposition User   Go to ED Now (or PCP triage) Christel Mormon, RN, Levada Dy    Comments  Will call patient back - she stated she needed to go to the bathroom- will attempt to find an appt at [facility] as no provider in the [place] office.  No appts at [facility] - she will start gatorade or powerade - and take the Lomotil. Encouraged again to go in.   Referrals  GO TO FACILITY UNDECIDED  GO TO FACILITY UNDECIDED   Disagree/Comply: Comply

## 2016-05-30 NOTE — Telephone Encounter (Signed)
Attempted to call patient regarding symptoms, LM requesting call back. Will attempt to return call to patient in the am to offer acute appointment with Elyn Aquas, PA-C on 05/31/16.

## 2016-05-30 NOTE — Telephone Encounter (Signed)
Patient returning call to [facility].  Please return call to patient at [phone].

## 2016-05-31 NOTE — Telephone Encounter (Signed)
Patient spoke to Lala Lund (front desk at [facility]) stating she felt better. Advised by Levada Dy to call back if she started feeling bad again to be scheduled for this afternoon.

## 2016-06-06 ENCOUNTER — Other Ambulatory Visit: Payer: Medicare Other

## 2016-06-10 ENCOUNTER — Encounter (HOSPITAL_BASED_OUTPATIENT_CLINIC_OR_DEPARTMENT_OTHER): Payer: Self-pay | Admitting: *Deleted

## 2016-06-13 ENCOUNTER — Encounter (HOSPITAL_BASED_OUTPATIENT_CLINIC_OR_DEPARTMENT_OTHER)
Admission: RE | Admit: 2016-06-13 | Discharge: 2016-06-13 | Disposition: A | Discharge status: Home / Self Care | Payer: Medicare Other | Source: Ambulatory Visit | Attending: General Surgery | Admitting: General Surgery

## 2016-06-13 ENCOUNTER — Ambulatory Visit
Admission: RE | Admit: 2016-06-13 | Discharge: 2016-06-13 | Disposition: A | Discharge status: Home / Self Care | Payer: Medicare Other | Source: Ambulatory Visit | Attending: General Surgery | Admitting: General Surgery

## 2016-06-13 DIAGNOSIS — K219 Gastro-esophageal reflux disease without esophagitis: Secondary | ICD-10-CM | POA: Diagnosis not present

## 2016-06-13 DIAGNOSIS — Z853 Personal history of malignant neoplasm of breast: Secondary | ICD-10-CM | POA: Diagnosis not present

## 2016-06-13 DIAGNOSIS — I10 Essential (primary) hypertension: Secondary | ICD-10-CM | POA: Diagnosis not present

## 2016-06-13 DIAGNOSIS — D0501 Lobular carcinoma in situ of right breast: Secondary | ICD-10-CM | POA: Diagnosis present

## 2016-06-13 DIAGNOSIS — Z79899 Other long term (current) drug therapy: Secondary | ICD-10-CM | POA: Diagnosis not present

## 2016-06-13 DIAGNOSIS — Z923 Personal history of irradiation: Secondary | ICD-10-CM | POA: Diagnosis not present

## 2016-06-14 ENCOUNTER — Encounter (HOSPITAL_BASED_OUTPATIENT_CLINIC_OR_DEPARTMENT_OTHER): Payer: Self-pay | Admitting: *Deleted

## 2016-06-14 ENCOUNTER — Ambulatory Visit
Admit: 2016-06-14 | Discharge: 2016-06-14 | Disposition: A | Discharge status: Home / Self Care | Payer: Medicare Other | Attending: General Surgery | Admitting: General Surgery

## 2016-06-14 ENCOUNTER — Ambulatory Visit (HOSPITAL_BASED_OUTPATIENT_CLINIC_OR_DEPARTMENT_OTHER)
Admission: RE | Admit: 2016-06-14 | Discharge: 2016-06-14 | Disposition: A | Discharge status: Home / Self Care | Payer: Medicare Other | Source: Ambulatory Visit | Attending: General Surgery | Admitting: General Surgery

## 2016-06-14 ENCOUNTER — Ambulatory Visit (HOSPITAL_BASED_OUTPATIENT_CLINIC_OR_DEPARTMENT_OTHER): Payer: Medicare Other | Admitting: Anesthesiology

## 2016-06-14 ENCOUNTER — Encounter (HOSPITAL_BASED_OUTPATIENT_CLINIC_OR_DEPARTMENT_OTHER)
Admission: RE | Disposition: A | Discharge status: Home / Self Care | Payer: Self-pay | Source: Ambulatory Visit | Attending: General Surgery

## 2016-06-14 DIAGNOSIS — Z853 Personal history of malignant neoplasm of breast: Secondary | ICD-10-CM | POA: Diagnosis not present

## 2016-06-14 DIAGNOSIS — Z79899 Other long term (current) drug therapy: Secondary | ICD-10-CM | POA: Insufficient documentation

## 2016-06-14 DIAGNOSIS — K219 Gastro-esophageal reflux disease without esophagitis: Secondary | ICD-10-CM | POA: Insufficient documentation

## 2016-06-14 DIAGNOSIS — D0501 Lobular carcinoma in situ of right breast: Secondary | ICD-10-CM

## 2016-06-14 DIAGNOSIS — I10 Essential (primary) hypertension: Secondary | ICD-10-CM | POA: Diagnosis not present

## 2016-06-14 DIAGNOSIS — Z923 Personal history of irradiation: Secondary | ICD-10-CM | POA: Insufficient documentation

## 2016-06-14 HISTORY — DX: Other specified postprocedural states: Z98.890

## 2016-06-14 HISTORY — PX: BREAST LUMPECTOMY WITH RADIOACTIVE SEED LOCALIZATION: SHX6424

## 2016-06-14 HISTORY — DX: Nausea with vomiting, unspecified: R11.2

## 2016-06-14 SURGERY — BREAST LUMPECTOMY WITH RADIOACTIVE SEED LOCALIZATION
Anesthesia: General | Site: Breast | Laterality: Right

## 2016-06-14 MED ORDER — DEXAMETHASONE SODIUM PHOSPHATE 10 MG/ML IJ SOLN
INTRAMUSCULAR | Status: AC
  Filled 2016-06-14: qty 1

## 2016-06-14 MED ORDER — OXYCODONE HCL 5 MG PO TABS: 5.0000 mg | ORAL_TABLET | Freq: Once | ORAL | Status: DC | PRN

## 2016-06-14 MED ORDER — LIDOCAINE 2% (20 MG/ML) 5 ML SYRINGE
INTRAMUSCULAR | Status: AC
  Filled 2016-06-14: qty 5

## 2016-06-14 MED ORDER — PROPOFOL 10 MG/ML IV BOLUS
INTRAVENOUS | Status: DC | PRN
  Administered 2016-06-14: 150 mg via INTRAVENOUS

## 2016-06-14 MED ORDER — MIDAZOLAM HCL 2 MG/2ML IJ SOLN
INTRAMUSCULAR | Status: AC
  Filled 2016-06-14: qty 2

## 2016-06-14 MED ORDER — LIDOCAINE 2% (20 MG/ML) 5 ML SYRINGE
INTRAMUSCULAR | Status: DC | PRN
  Administered 2016-06-14: 60 mg via INTRAVENOUS

## 2016-06-14 MED ORDER — BUPIVACAINE HCL (PF) 0.25 % IJ SOLN
INTRAMUSCULAR | Status: DC | PRN
  Administered 2016-06-14: 20 mL

## 2016-06-14 MED ORDER — LACTATED RINGERS IV SOLN
INTRAVENOUS | Status: DC
  Administered 2016-06-14 (×2): via INTRAVENOUS

## 2016-06-14 MED ORDER — ONDANSETRON HCL 4 MG/2ML IJ SOLN
INTRAMUSCULAR | Status: AC
  Filled 2016-06-14: qty 2

## 2016-06-14 MED ORDER — CHLORHEXIDINE GLUCONATE CLOTH 2 % EX PADS: 6.0000 | MEDICATED_PAD | Freq: Once | CUTANEOUS | Status: DC

## 2016-06-14 MED ORDER — CEFAZOLIN SODIUM-DEXTROSE 2-4 GM/100ML-% IV SOLN
INTRAVENOUS | Status: AC
  Filled 2016-06-14: qty 100

## 2016-06-14 MED ORDER — OXYCODONE HCL 5 MG/5ML PO SOLN: 5.0000 mg | Freq: Once | ORAL | Status: DC | PRN

## 2016-06-14 MED ORDER — DEXAMETHASONE SODIUM PHOSPHATE 4 MG/ML IJ SOLN
INTRAMUSCULAR | Status: DC | PRN
  Administered 2016-06-14: 10 mg via INTRAVENOUS

## 2016-06-14 MED ORDER — ONDANSETRON HCL 4 MG/2ML IJ SOLN
INTRAMUSCULAR | Status: DC | PRN
  Administered 2016-06-14: 4 mg via INTRAVENOUS

## 2016-06-14 MED ORDER — MIDAZOLAM HCL 2 MG/2ML IJ SOLN
1.0000 mg | INTRAMUSCULAR | Status: DC | PRN
  Administered 2016-06-14: 1 mg via INTRAVENOUS

## 2016-06-14 MED ORDER — SCOPOLAMINE 1 MG/3DAYS TD PT72: 1.0000 | MEDICATED_PATCH | Freq: Once | TRANSDERMAL | Status: DC | PRN

## 2016-06-14 MED ORDER — HYDROMORPHONE HCL 1 MG/ML IJ SOLN: 0.2500 mg | INTRAMUSCULAR | Status: DC | PRN

## 2016-06-14 MED ORDER — FENTANYL CITRATE (PF) 100 MCG/2ML IJ SOLN
50.0000 ug | INTRAMUSCULAR | Status: DC | PRN
  Administered 2016-06-14: 50 ug via INTRAVENOUS

## 2016-06-14 MED ORDER — EPHEDRINE SULFATE-NACL 50-0.9 MG/10ML-% IV SOSY
PREFILLED_SYRINGE | INTRAVENOUS | Status: DC | PRN
  Administered 2016-06-14: 10 mg via INTRAVENOUS

## 2016-06-14 MED ORDER — FENTANYL CITRATE (PF) 100 MCG/2ML IJ SOLN
INTRAMUSCULAR | Status: AC
  Filled 2016-06-14: qty 2

## 2016-06-14 MED ORDER — EPHEDRINE 5 MG/ML INJ
INTRAVENOUS | Status: AC
  Filled 2016-06-14: qty 10

## 2016-06-14 MED ORDER — MEPERIDINE HCL 25 MG/ML IJ SOLN: 6.2500 mg | INTRAMUSCULAR | Status: DC | PRN

## 2016-06-14 MED ORDER — HYDROCODONE-ACETAMINOPHEN 5-325 MG PO TABS: 1.0000 | ORAL_TABLET | ORAL | 0 refills | Status: DC | PRN

## 2016-06-14 MED ORDER — BUPIVACAINE-EPINEPHRINE (PF) 0.25% -1:200000 IJ SOLN
INTRAMUSCULAR | Status: AC
  Filled 2016-06-14: qty 30

## 2016-06-14 MED ORDER — PROMETHAZINE HCL 25 MG/ML IJ SOLN: 6.2500 mg | INTRAMUSCULAR | Status: DC | PRN

## 2016-06-14 MED ORDER — CEFAZOLIN SODIUM-DEXTROSE 2-4 GM/100ML-% IV SOLN
2.0000 g | INTRAVENOUS | Status: AC
  Administered 2016-06-14: 2 g via INTRAVENOUS

## 2016-06-14 MED ORDER — BUPIVACAINE HCL (PF) 0.25 % IJ SOLN
INTRAMUSCULAR | Status: AC
  Filled 2016-06-14: qty 30

## 2016-06-14 SURGICAL SUPPLY — 45 items
ADH SKN CLS APL DERMABOND .7 (GAUZE/BANDAGES/DRESSINGS) ×1
APPLIER CLIP 9.375 MED OPEN (MISCELLANEOUS)
APR CLP MED 9.3 20 MLT OPN (MISCELLANEOUS)
BLADE SURG 15 STRL LF DISP TIS (BLADE) ×1 IMPLANT
BLADE SURG 15 STRL SS (BLADE) ×2
CANISTER SUC SOCK COL 7IN (MISCELLANEOUS) ×1 IMPLANT
CANISTER SUCT 1200ML W/VALVE (MISCELLANEOUS) ×2 IMPLANT
CHLORAPREP W/TINT 26ML (MISCELLANEOUS) ×2 IMPLANT
CLIP APPLIE 9.375 MED OPEN (MISCELLANEOUS) IMPLANT
COVER BACK TABLE 60X90IN (DRAPES) ×2 IMPLANT
COVER MAYO STAND STRL (DRAPES) ×2 IMPLANT
COVER PROBE W GEL 5X96 (DRAPES) ×2 IMPLANT
DECANTER SPIKE VIAL GLASS SM (MISCELLANEOUS) IMPLANT
DERMABOND ADVANCED (GAUZE/BANDAGES/DRESSINGS) ×1
DERMABOND ADVANCED .7 DNX12 (GAUZE/BANDAGES/DRESSINGS) ×1 IMPLANT
DEVICE DUBIN W/COMP PLATE 8390 (MISCELLANEOUS) ×2 IMPLANT
DRAPE LAPAROSCOPIC ABDOMINAL (DRAPES) ×1 IMPLANT
DRAPE UTILITY XL STRL (DRAPES) ×2 IMPLANT
ELECT COATED BLADE 2.86 ST (ELECTRODE) ×2 IMPLANT
ELECT REM PT RETURN 9FT ADLT (ELECTROSURGICAL) ×2
ELECTRODE REM PT RTRN 9FT ADLT (ELECTROSURGICAL) ×1 IMPLANT
GLOVE BIO SURGEON STRL SZ7.5 (GLOVE) ×4 IMPLANT
GLOVE BIOGEL PI IND STRL 7.0 (GLOVE) IMPLANT
GLOVE BIOGEL PI INDICATOR 7.0 (GLOVE) ×1
GLOVE SURG SS PI 6.5 STRL IVOR (GLOVE) ×1 IMPLANT
GOWN STRL REUS W/ TWL LRG LVL3 (GOWN DISPOSABLE) ×2 IMPLANT
GOWN STRL REUS W/TWL LRG LVL3 (GOWN DISPOSABLE) ×4
ILLUMINATOR WAVEGUIDE N/F (MISCELLANEOUS) IMPLANT
KIT MARKER MARGIN INK (KITS) ×2 IMPLANT
LIGHT WAVEGUIDE WIDE FLAT (MISCELLANEOUS) IMPLANT
NDL HYPO 25X1 1.5 SAFETY (NEEDLE) IMPLANT
NEEDLE HYPO 25X1 1.5 SAFETY (NEEDLE) ×2 IMPLANT
NS IRRIG 1000ML POUR BTL (IV SOLUTION) ×1 IMPLANT
PACK BASIN DAY SURGERY FS (CUSTOM PROCEDURE TRAY) ×2 IMPLANT
PENCIL BUTTON HOLSTER BLD 10FT (ELECTRODE) ×2 IMPLANT
SLEEVE SCD COMPRESS KNEE MED (MISCELLANEOUS) ×2 IMPLANT
SPONGE LAP 18X18 X RAY DECT (DISPOSABLE) ×2 IMPLANT
SUT MON AB 4-0 PC3 18 (SUTURE) ×1 IMPLANT
SUT SILK 2 0 SH (SUTURE) IMPLANT
SUT VICRYL 3-0 CR8 SH (SUTURE) ×2 IMPLANT
SYR CONTROL 10ML LL (SYRINGE) ×1 IMPLANT
TOWEL OR 17X24 6PK STRL BLUE (TOWEL DISPOSABLE) ×2 IMPLANT
TOWEL OR NON WOVEN STRL DISP B (DISPOSABLE) ×1 IMPLANT
TUBE CONNECTING 20X1/4 (TUBING) ×2 IMPLANT
YANKAUER SUCT BULB TIP NO VENT (SUCTIONS) ×1 IMPLANT

## 2016-06-14 NOTE — Anesthesia Procedure Notes (Signed)
Procedure Name: LMA Insertion Date/Time: 06/14/2016 9:12 AM Performed by: Lyndee Leo Pre-anesthesia Checklist: Patient identified, Emergency Drugs available, Suction available and Patient being monitored Patient Re-evaluated:Patient Re-evaluated prior to inductionOxygen Delivery Method: Circle system utilized Preoxygenation: Pre-oxygenation with 100% oxygen Intubation Type: IV induction Ventilation: Mask ventilation without difficulty LMA: LMA inserted LMA Size: 4.0 Number of attempts: 1 Airway Equipment and Method: Bite block Placement Confirmation: positive ETCO2 Tube secured with: Tape Dental Injury: Teeth and Oropharynx as per pre-operative assessment

## 2016-06-14 NOTE — Op Note (Signed)
06/14/2016  9:53 AM  PATIENT:  Jacqueline [name]  81 y.o. female  PRE-OPERATIVE DIAGNOSIS:  RIGHT BREAST LCIS  POST-OPERATIVE DIAGNOSIS:  RIGHT BREAST LCIS  PROCEDURE:  Procedure(s): RIGHT BREAST LUMPECTOMY WITH RADIOACTIVE SEED LOCALIZATION (Right)  SURGEON:  Surgeon(s) and Role:    * Jovita Kussmaul, MD - Primary  PHYSICIAN ASSISTANT:   ASSISTANTS: none   ANESTHESIA:   local and general  EBL:  Total I/O In: 500 [I.V.:500] Out: -   BLOOD ADMINISTERED:none  DRAINS: none   LOCAL MEDICATIONS USED:  MARCAINE     SPECIMEN:  Source of Specimen:  right breast tissue with additional deep margin  DISPOSITION OF SPECIMEN:  PATHOLOGY  COUNTS:  YES  TOURNIQUET:  * No tourniquets in log *  DICTATION: .Dragon Dictation   After informed consent was obtained the patient was brought to the operating room and placed in the supine position on the operating room table. After adequate induction of general anesthesia the patient's right breast was prepped with ChloraPrep, allowed to dry, and draped in usual sterile manner. An appropriate timeout was performed. Previously an I-125 seed was placed in the upper outer portion of the right breast mark an area of lobular carcinoma in situ. The neoprobe was sent to I-125 in the area of radioactivity was readily identified. An elliptical incision was made in the skin overlying the area of radioactivity with a 15 blade knife. The incision was carried through the skin and subcutaneous tissue sharply with electrocautery. While checking the area of radioactivity frequently a circular portion of breast tissue was excised sharply around the radioactive seed. Once the specimen was removed it was oriented with the appropriate paint colors. I did transpose the green and black surfaces. A specimen radiograph was obtained that showed DC'd to being in the center of the specimen. I did not see the clip. Most of what we appeared to be seen was old scar from her  previous lumpectomy. I did take an additional deep margin because of the abnormality of the scar tissue in this location. This was sent as a separate specimen. The wound was then irrigated with copious amounts saline and infiltrated with quarter percent Marcaine. The deep layer of the wound was then closed with layers of interrupted 3-0 Vicryl stitches. The skin was then closed with interrupted 4-0 Monocryl subcuticular stitches. Dermabond dressings were applied. The patient tolerated the procedure well. At the end of the case all needle sponge and instrument counts were correct. The patient was then awakened and taken to recovery in stable condition.  PLAN OF CARE: Discharge to home after PACU  PATIENT DISPOSITION:  PACU - hemodynamically stable.   Delay start of Pharmacological VTE agent (>24hrs) due to surgical blood loss or risk of bleeding: not applicable

## 2016-06-14 NOTE — Transfer of Care (Signed)
Immediate Anesthesia Transfer of Care Note  Patient: Jacqueline [name]  Procedure(s) Performed: Procedure(s): RIGHT BREAST LUMPECTOMY WITH RADIOACTIVE SEED LOCALIZATION (Right)  Patient Location: PACU  Anesthesia Type:General  Level of Consciousness: awake, sedated and patient cooperative  Airway & Oxygen Therapy: Patient Spontanous Breathing and Patient connected to face mask oxygen  Post-op Assessment: Report given to RN and Post -op Vital signs reviewed and stable  Post vital signs: Reviewed and stable  Last Vitals:  Vitals:   06/14/16 0725  BP: 134/66  Pulse: 71  Resp: 16  Temp: 36.6 C    Last Pain:  Vitals:   06/14/16 0725  TempSrc: Oral      Patients Stated Pain Goal: 0 ([id number])  Complications: No apparent anesthesia complications

## 2016-06-14 NOTE — H&P (Signed)
Jacqueline [name]. [facility]  Location: [facility] Surgery Patient #: [id number] DOB: 12/21/1933 Married / Language: English / Race: White Female   History of Present Illness  The patient is a 81 year old female who presents with a breast mass. We are asked to see the patient in consultation by Dr. Mable Paris to evaluate her for lobular carcinoma in situ of the right breast. The patient is an 81 year old white female who has a history of right breast cancer in 2012 and left breast cancer in 2007. Both of these were treated with lumpectomy and partial breast radiation. She took antiestrogen therapy after the first cancer. She did not take it after the second cancer because she developed a second cancer while on tamoxifen. She denies any breast pain. She denies any discharge from the nipple. She does not smoke. On her recent screening mammogram she was found to have a 2.5 cm area of distortion in the upper outer right breast. This was biopsied and came back as lobular carcinoma in situ.   Other Problems  Breast Cancer  Cholelithiasis  Gastroesophageal Reflux Disease  High blood pressure  Migraine Headache   Past Surgical History  Breast Biopsy  multiple Breast Mass; Local Excision  Bilateral. Cataract Surgery  Bilateral. Colon Polyp Removal - Colonoscopy  Gallbladder Surgery - Laparoscopic  Knee Surgery  Bilateral.  Diagnostic Studies History  Colonoscopy  1-5 years ago Mammogram  within last year Pap Smear  1-5 years ago  Allergies  ASPIRIN  Nausea. CODEINE  Nausea.  Medication History Vitamin D3 (2000UNIT Tablet, Oral daily) Active. Vitamin B 12 (100MCG Lozenge, Oral daily) Active. Voltaren (1% Gel, External daily) Active. Imodium (2MG  Capsule, Oral three times a week) Active. Remeron (15MG  Tablet, Oral daily) Active. PriLOSEC (20MG  Capsule DR, Oral daily) Active. SEROquel (25MG  Tablet, Oral daily) Active. Tazorac (0.05% Cream, External  daily) Active. Diovan (160MG  Tablet, Oral daily) Active. Medications Reconciled  Social History  Alcohol use  Occasional alcohol use. No caffeine use  No drug use  Tobacco use  Never smoker.  Family History Colon Polyps  Mother. Heart Disease  Brother. Hypertension  Mother, Sister. Migraine Headache  Daughter, Father, Sister.  Pregnancy / Birth History  Age at menarche  55 years. Age of menopause  2-55 Gravida  2 Maternal age  69-30 Para  2    Review of Systems  General Not Present- Appetite Loss, Chills, Fatigue, Fever, Night Sweats, Weight Gain and Weight Loss. Skin Not Present- Change in Wart/Mole, Dryness, Hives, Jaundice, New Lesions, Non-Healing Wounds, Rash and Ulcer. HEENT Not Present- Earache, Hearing Loss, Hoarseness, Nose Bleed, Oral Ulcers, Ringing in the Ears, Seasonal Allergies, Sinus Pain, Sore Throat, Visual Disturbances, Wears glasses/contact lenses and Yellow Eyes. Breast Not Present- Breast Mass, Breast Pain, Nipple Discharge and Skin Changes. Cardiovascular Not Present- Chest Pain, Difficulty Breathing Lying Down, Leg Cramps, Palpitations, Rapid Heart Rate, Shortness of Breath and Swelling of Extremities. Gastrointestinal Not Present- Abdominal Pain, Bloating, Bloody Stool, Change in Bowel Habits, Chronic diarrhea, Constipation, Difficulty Swallowing, Excessive gas, Gets full quickly at meals, Hemorrhoids, Indigestion, Nausea, Rectal Pain and Vomiting. Female Genitourinary Not Present- Frequency, Nocturia, Painful Urination, Pelvic Pain and Urgency. Musculoskeletal Not Present- Back Pain, Joint Pain, Joint Stiffness, Muscle Pain, Muscle Weakness and Swelling of Extremities. Neurological Not Present- Decreased Memory, Fainting, Headaches, Numbness, Seizures, Tingling, Tremor, Trouble walking and Weakness. Psychiatric Not Present- Anxiety, Bipolar, Change in Sleep Pattern, Depression, Fearful and Frequent crying. Endocrine Not Present- Cold  Intolerance, Excessive Hunger, Hair Changes, Heat  Intolerance, Hot flashes and New Diabetes. Hematology Not Present- Blood Thinners, Easy Bruising, Excessive bleeding, Gland problems, HIV and Persistent Infections.  Vitals  Weight: 164.4 lb Height: 63in Body Surface Area: 1.78 m Body Mass Index: 29.12 kg/m  Temp.: 97.76F  BP: 126/82 (Sitting, Left Arm, Standard)       Physical Exam  General Mental Status-Alert. General Appearance-Consistent with stated age. Hydration-Well hydrated. Voice-Normal.  Head and Neck Head-normocephalic, atraumatic with no lesions or palpable masses. Trachea-midline. Thyroid Gland Characteristics - normal size and consistency.  Eye Eyeball - Bilateral-Extraocular movements intact. Sclera/Conjunctiva - Bilateral-No scleral icterus.  Chest and Lung Exam Chest and lung exam reveals -quiet, even and easy respiratory effort with no use of accessory muscles and on auscultation, normal breath sounds, no adventitious sounds and normal vocal resonance. Inspection Chest Wall - Normal. Back - normal.  Breast Note: There is some nodularity associated with the edge of the previous lumpectomy in the upper outer right breast. There is no palpable mass of the left breast. There is no palpable axillary, supraclavicular, or cervical lymphadenopathy.   Cardiovascular Cardiovascular examination reveals -normal heart sounds, regular rate and rhythm with no murmurs and normal pedal pulses bilaterally.  Abdomen Inspection Inspection of the abdomen reveals - No Hernias. Skin - Scar - no surgical scars. Palpation/Percussion Palpation and Percussion of the abdomen reveal - Soft, Non Tender, No Rebound tenderness, No Rigidity (guarding) and No hepatosplenomegaly. Auscultation Auscultation of the abdomen reveals - Bowel sounds normal.  Neurologic Neurologic evaluation reveals -alert and oriented x 3 with no impairment of recent or  remote memory. Mental Status-Normal.  Musculoskeletal Normal Exam - Left-Upper Extremity Strength Normal and Lower Extremity Strength Normal. Normal Exam - Right-Upper Extremity Strength Normal and Lower Extremity Strength Normal.  Lymphatic Head & Neck  General Head & Neck Lymphatics: Bilateral - Description - Normal. Axillary  General Axillary Region: Bilateral - Description - Normal. Tenderness - Non Tender. Femoral & Inguinal  Generalized Femoral & Inguinal Lymphatics: Bilateral - Description - Normal. Tenderness - Non Tender.    Assessment & Plan  LOBULAR CARCINOMA IN SITU (LCIS) OF RIGHT BREAST (D05.01) Impression: The patient appears to have an area of lobular carcinoma in situ measuring 2.5 cm in the upper outer right breast. She also has a history of bilateral breast cancer that was treated with surgery and partial breast radiation. I have talked her about the different options for surgery and at this point she favors breast conservation. I have also talked to her about the diagnosis of lobular carcinoma in situ and how it is considered more of a risk factor for breast cancer than a precursor to invasive disease. I will plan for a right breast radioactive seed localized lumpectomy. I have discussed with her in detail the risks and benefits of the operation as well as some of the technical aspects and she understands and wishes to proceed Current Plans Pt Education - Breast cancer: discussed with patient and provided information.   Signed by Luella Cook, MD

## 2016-06-14 NOTE — Discharge Instructions (Signed)

## 2016-06-14 NOTE — Anesthesia Preprocedure Evaluation (Signed)
Anesthesia Evaluation  Patient identified by MRN, date of birth, ID band Patient awake    Reviewed: Allergy & Precautions, NPO status , Patient's Chart, lab work & pertinent test results  History of Anesthesia Complications (+) PONV and history of anesthetic complications  Airway Mallampati: II  TM Distance: >3 FB Neck ROM: Full    Dental no notable dental hx.    Pulmonary neg pulmonary ROS,    Pulmonary exam normal breath sounds clear to auscultation       Cardiovascular hypertension, negative cardio ROS Normal cardiovascular exam Rhythm:Regular Rate:Normal     Neuro/Psych negative neurological ROS  negative psych ROS   GI/Hepatic Neg liver ROS, GERD  ,  Endo/Other  negative endocrine ROS  Renal/GU Renal disease     Musculoskeletal negative musculoskeletal ROS (+)   Abdominal   Peds  Hematology negative hematology ROS (+)   Anesthesia Other Findings   Reproductive/Obstetrics negative OB ROS                             Anesthesia Physical Anesthesia Plan  ASA: III  Anesthesia Plan: General   Post-op Pain Management:    Induction: Intravenous  Airway Management Planned: LMA  Additional Equipment:   Intra-op Plan:   Post-operative Plan: Extubation in OR  Informed Consent: I have reviewed the patients History and Physical, chart, labs and discussed the procedure including the risks, benefits and alternatives for the proposed anesthesia with the patient or authorized representative who has indicated his/her understanding and acceptance.   Dental advisory given  Plan Discussed with: CRNA  Anesthesia Plan Comments:         Anesthesia Quick Evaluation

## 2016-06-14 NOTE — Interval H&P Note (Signed)
History and Physical Interval Note:  06/14/2016 8:48 AM  Jacqueline [name]  has presented today for surgery, with the diagnosis of RIGHT BREAST LCIS  The various methods of treatment have been discussed with the patient and family. After consideration of risks, benefits and other options for treatment, the patient has consented to  Procedure(s): RIGHT BREAST LUMPECTOMY WITH RADIOACTIVE SEED LOCALIZATION (Right) as a surgical intervention .  The patient's history has been reviewed, patient examined, no change in status, stable for surgery.  I have reviewed the patient's chart and labs.  Questions were answered to the patient's satisfaction.     TOTH III,Anneke Cundy S

## 2016-06-15 NOTE — Anesthesia Postprocedure Evaluation (Signed)
Anesthesia Post Note  Patient: Jacqueline [name]  Procedure(s) Performed: Procedure(s) (LRB): RIGHT BREAST LUMPECTOMY WITH RADIOACTIVE SEED LOCALIZATION (Right)  Patient location during evaluation: PACU Anesthesia Type: General Level of consciousness: sedated and patient cooperative Pain management: pain level controlled Vital Signs Assessment: post-procedure vital signs reviewed and stable Respiratory status: spontaneous breathing Cardiovascular status: stable Anesthetic complications: no    Last Vitals:  Vitals:   06/14/16 1015 06/14/16 1046  BP: 133/69   Pulse: 69 65  Resp: 13 16  Temp:  36.5 C    Last Pain:  Vitals:   06/14/16 1046  TempSrc: Oral  PainSc: 0-No pain                 Nolon Nations

## 2016-06-17 ENCOUNTER — Encounter (HOSPITAL_BASED_OUTPATIENT_CLINIC_OR_DEPARTMENT_OTHER): Payer: Self-pay | Admitting: General Surgery

## 2016-07-02 ENCOUNTER — Telehealth: Payer: Self-pay | Admitting: Family Medicine

## 2016-07-02 NOTE — Telephone Encounter (Signed)
Patient is asking that you give her a call on her mobile when you return regarding her vitamin d rx

## 2016-07-04 ENCOUNTER — Telehealth: Payer: Self-pay | Admitting: Family Medicine

## 2016-07-04 NOTE — Telephone Encounter (Signed)
Please make sure that she is taking this weekly rather than daily.  And if this is the reaction she is having, she can stop the weekly dose and start 5,000 units OTC daily

## 2016-07-04 NOTE — Telephone Encounter (Signed)
Patient notified of PCP recommendations and is agreement and expresses an understanding.  

## 2016-07-04 NOTE — Telephone Encounter (Signed)
Pt advised she was taking once a week. She will take two of what she has at home which will be 4000iu.. I advised pt that would be ok.

## 2016-07-04 NOTE — Telephone Encounter (Signed)
Patient calling to report she is having an reaction to Vitamin D, Ergocalciferol, (DRISDOL) 50000 units CAPS capsule.  She states she has severe nausea, headache and strong metallic taste in her mouth since taking the vitamin D.

## 2016-07-05 ENCOUNTER — Ambulatory Visit
Admission: RE | Admit: 2016-07-05 | Discharge: 2016-07-05 | Disposition: A | Discharge status: Home / Self Care | Payer: Medicare Other | Source: Ambulatory Visit | Attending: Family Medicine | Admitting: Family Medicine

## 2016-07-05 DIAGNOSIS — M8580 Other specified disorders of bone density and structure, unspecified site: Secondary | ICD-10-CM

## 2016-07-22 ENCOUNTER — Encounter: Payer: Self-pay | Admitting: Hematology and Oncology

## 2016-07-22 ENCOUNTER — Telehealth: Payer: Self-pay | Admitting: Hematology and Oncology

## 2016-07-22 NOTE — Telephone Encounter (Signed)
Appt scheduled w/Gudena on 1/29 @345pm . Pt aware to arrive 30 minutes early. Demographics verified. Letter mailed to the pt.

## 2016-07-29 ENCOUNTER — Encounter: Payer: Self-pay | Admitting: Hematology and Oncology

## 2016-07-29 ENCOUNTER — Ambulatory Visit (HOSPITAL_BASED_OUTPATIENT_CLINIC_OR_DEPARTMENT_OTHER): Payer: Medicare Other | Admitting: Hematology and Oncology

## 2016-07-29 DIAGNOSIS — D0501 Lobular carcinoma in situ of right breast: Secondary | ICD-10-CM

## 2016-07-29 DIAGNOSIS — Z853 Personal history of malignant neoplasm of breast: Secondary | ICD-10-CM | POA: Diagnosis not present

## 2016-07-29 NOTE — Assessment & Plan Note (Signed)
Right lumpectomy 06/14/2016: LCIS with necrosis 3 cm, broadly involves the posterior margin, additional deep margin LCIS with necrosis 1.5 cm focally 0.1 cm from posterior margin.  Pathology counseling: I discussed with her the difference between LCIS and invasive cancer. LCIS is a risk factor for breast cancer and not a premalignant condition. Hence it does not need to be further resected. However she is at higher than normal risk of breast cancer. Risk of invasive and noninvasive breast cancer with LCIS is 1% per year. Her cumulative risk lifetime would be around 10%. Tamoxifen would reduce this risk by half. This is based on NSABP P-1 clinical trial  Risk reduction strategies: 1. Tamoxifen or raloxifene are indicated to decrease the risk of another noninvasive or invasive breast cancer. Patient fully understands that neither of these medications would prolong her life but certainly help decrease recurrence rate by 50% . 2. Recommended annual mammograms and breast exams.   Tamoxifen toxicities:We discussed the risks and benefits of tamoxifen. These include but not limited to insomnia, hot flashes, mood changes, vaginal dryness, and weight gain. Although rare, serious side effects including endometrial cancer, risk of blood clots were also discussed. We strongly believe that the benefits far outweigh the risks. Patient understands these risks and consented to starting treatment. Planned treatment duration is 5 years.  Return to clinic in 3 months for toxicity check and follow-up

## 2016-07-29 NOTE — Progress Notes (Signed)
[facility] NOTE  Patient Care Team: Midge Minium, MD as PCP - General (Family Medicine) Luberta Mutter, MD as Consulting Physician (Ophthalmology)  CHIEF COMPLAINTS/PURPOSE OF CONSULTATION:  Newly diagnosed LCIS  HISTORY OF PRESENTING ILLNESS:  Jacqueline [name] 82 y.o. female is here because of recent diagnosis of right breast LCIS. Patient had a prior history of bilateral breast cancers. Original breast cancer was in the left breast 10 years ago for what appeared to be a stage I breast cancer. She underwent lumpectomy followed by radiation and 5 years of tamoxifen therapy. 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 She did not receive any adjuvant antiestrogen therapy. This current time she presented with routine mammogram that showed an abnormality that was biopsied and was shown to have LCIS. She had underwent lumpectomy which confirmed that she had LCIS and she was sent to [place] for discussion regarding any role of adjuvant hormonal therapy.  I reviewed her records extensively and collaborated the history with the patient.  MEDICAL HISTORY:  Past Medical History:  Diagnosis Date  . Breast abscess 07/30/2011   Developed in the mammosite cavity and treated with I&D, packed and healed   . Breast cancer ([place]) hx 2007   stage T1b grade 2 ER/positive,ductal ca left breast  . Breast lump    rt  . Cancer ([place]) 2007, 2012   breast- bilateral  . Complication of anesthesia   . Failed total left knee replacement ([place])   . GERD (gastroesophageal reflux disease)   . Glaucoma   . Hyperlipidemia   . Hypertension   . Kidney stones   . Osteopenia   . Osteoporosis   . PONV (postoperative nausea and vomiting)   . Ruptured disc, cervical   . Sleep initiation dysfunction   . Torn rotator cuff 08/2012    SURGICAL HISTORY: Past Surgical History:  Procedure Laterality Date  . BREAST LUMPECTOMY WITH RADIOACTIVE SEED  LOCALIZATION Right 06/14/2016   Procedure: RIGHT BREAST LUMPECTOMY WITH RADIOACTIVE SEED LOCALIZATION;  Surgeon: Autumn Messing III, MD;  Location: [facility];  Service: General;  Laterality: Right;  . CATARACT EXTRACTION    . CHOLECYSTECTOMY    . DILATION AND CURETTAGE OF UTERUS    . INCISE AND DRAIN ABCESS     breast   . JOINT REPLACEMENT    . KIDNEY STONE SURGERY    . MASTECTOMY PARTIAL / LUMPECTOMY  2007   Left - Dr Margot Chimes  . MASTECTOMY PARTIAL / LUMPECTOMY  2009   Right Dr Margot Chimes  . TOTAL KNEE ARTHROPLASTY  05,07    SOCIAL HISTORY: Social History   Social History  . Marital status: Married    Spouse name: N/A  . Number of children: N/A  . Years of education: N/A   Occupational History  . Not on file.   Social History Main Topics  . Smoking status: Never Smoker  . Smokeless tobacco: Never Used  . Alcohol use No  . Drug use: No  . Sexual activity: Yes    Birth control/ protection: Post-menopausal   Other Topics Concern  . Not on file   Social History Narrative  . No narrative on file    FAMILY HISTORY: Family History  Problem Relation Age of Onset  . Hypertension Mother   . Colon polyps Mother   . Stroke Father   . Cancer Brother     throat  . Colon cancer Neg Hx   .  Stomach cancer Neg Hx     ALLERGIES:  is allergic to aspirin and codeine sulfate.  MEDICATIONS:  Current Outpatient Prescriptions  Medication Sig Dispense Refill  . AMBULATORY NON FORMULARY MEDICATION Take 1 capsule by mouth daily. Medication Name: Tumeric    . Cholecalciferol (VITAMIN D3) 2000 UNITS TABS Take 2,000 Units by mouth daily.     . clobetasol ointment (TEMOVATE) [id number] % Apply 1 application topically 2 (two) times daily.    . cromolyn (NASALCROM) 5.2 MG/ACT nasal spray Place 1 spray into both nostrils 4 (four) times daily.    . Cyanocobalamin (VITAMIN B 12 PO) Take 1 capsule by mouth.    . diclofenac sodium (VOLTAREN) 1 % GEL Reported on 11/28/2015  1  .  HYDROcodone-acetaminophen (NORCO/VICODIN) 5-325 MG tablet Take 1-2 tablets by mouth every 4 (four) hours as needed for moderate pain or severe pain. 20 tablet 0  . loperamide (IMODIUM) 2 MG capsule Take 2 mg by mouth as needed for diarrhea or loose stools.    . mirtazapine (REMERON) 15 MG tablet TAKE 1 TABLET AT BEDTIME 90 tablet 3  . omeprazole (PRILOSEC OTC) 20 MG tablet Take 1 tablet (20 mg total) by mouth daily. 90 tablet 4  . QUEtiapine (SEROQUEL) 25 MG tablet TAKE 1 TABLET AT BEDTIME 90 tablet 3  . TAZORAC 0.05 % cream APPLY TO AFFECTED AREA IN THE EVENING  1  . valsartan (DIOVAN) 160 MG tablet Take 1 tablet (160 mg total) by mouth daily. 30 tablet 6  . Vitamin D, Ergocalciferol, (DRISDOL) 50000 units CAPS capsule Take 1 capsule (50,000 Units total) by mouth every 7 (seven) days. 12 capsule 0   No current facility-administered medications for this visit.     REVIEW OF SYSTEMS:   Constitutional: Denies fevers, chills or abnormal night sweats Eyes: Denies blurriness of vision, double vision or watery eyes Ears, nose, mouth, throat, and face: Denies mucositis or sore throat Respiratory: Denies cough, dyspnea or wheezes Cardiovascular: Denies palpitation, chest discomfort or lower extremity swelling Gastrointestinal:  Denies nausea, heartburn or change in bowel habits Skin: Denies abnormal skin rashes Lymphatics: Denies new lymphadenopathy or easy bruising Neurological:Denies numbness, tingling or new weaknesses Behavioral/Psych: Mood is stable, no new changes  Breast: Recent right lumpectomy All other systems were reviewed with the patient and are negative.  PHYSICAL EXAMINATION: ECOG PERFORMANCE STATUS: 0 - Asymptomatic  Vitals:   07/29/16 1526  BP: (!) 152/63  Pulse: 79  Resp: 18  Temp: 97.6 F (36.4 C)   Filed Weights   07/29/16 1526  Weight: 164 lb 3.2 oz (74.5 kg)    GENERAL:alert, no distress and comfortable SKIN: skin color, texture, turgor are normal, no rashes  or significant lesions EYES: normal, conjunctiva are pink and non-injected, sclera clear OROPHARYNX:no exudate, no erythema and lips, buccal mucosa, and tongue normal  NECK: supple, thyroid normal size, non-tender, without nodularity LYMPH:  no palpable lymphadenopathy in the cervical, axillary or inguinal LUNGS: clear to auscultation and percussion with normal breathing effort HEART: regular rate & rhythm and no murmurs and no lower extremity edema ABDOMEN:abdomen soft, non-tender and normal bowel sounds Musculoskeletal:no cyanosis of digits and no clubbing  PSYCH: alert & oriented x 3 with fluent speech NEURO: no focal motor/sensory deficits  LABORATORY DATA:  I have reviewed the data as listed Lab Results  Component Value Date   WBC 7.8 05/17/2016   HGB 14.4 05/17/2016   HCT 42.7 05/17/2016   MCV 91.1 05/17/2016   PLT 176.0 05/17/2016  Lab Results  Component Value Date   NA 142 05/17/2016   K 4.2 05/17/2016   CL 110 05/17/2016   CO2 28 05/17/2016    RADIOGRAPHIC STUDIES: I have personally reviewed the radiological reports and agreed with the findings in the report.  ASSESSMENT AND PLAN:  Lobular carcinoma in situ (LCIS) of right breast Right lumpectomy 06/14/2016: LCIS with necrosis 3 cm, broadly involves the posterior margin, additional deep margin LCIS with necrosis 1.5 cm focally 0.1 cm from posterior margin.  Pathology counseling: I discussed with her the difference between LCIS and invasive cancer. LCIS is a risk factor for breast cancer and not a premalignant condition. Hence it does not need to be further resected. However she is at higher than normal risk of breast cancer. Risk of invasive and noninvasive breast cancer with LCIS is 1% per year. Her cumulative risk lifetime would be around 10%. Tamoxifen would reduce this risk by half. This is based on NSABP P-1 clinical trial  Risk reduction strategies: 1. Tamoxifen or raloxifene are indicated to decrease the risk  of another noninvasive or invasive breast cancer. Patient fully understands that neither of these medications would prolong her life but certainly help decrease recurrence rate by 50% .However because of her age, she elected to not pursue tamoxifen therapy. 2. Recommended annual mammograms and breast exams.   Return to clinic on an as-needed basis. She'll be followed by surgery and her primary care physician and her gynecologist for mammograms and breast exams.  All questions were answered. The patient knows to call the clinic with any problems, questions or concerns.    Rulon Eisenmenger, MD 07/29/16

## 2016-08-09 ENCOUNTER — Other Ambulatory Visit: Payer: Self-pay | Admitting: Family Medicine

## 2016-08-19 ENCOUNTER — Other Ambulatory Visit: Payer: Self-pay | Admitting: Family Medicine

## 2016-08-19 MED ORDER — QUETIAPINE FUMARATE 25 MG PO TABS: 25.0000 mg | ORAL_TABLET | Freq: Every day | ORAL | 0 refills | Status: DC

## 2016-08-19 NOTE — Telephone Encounter (Signed)
Last OV 05/20/16 seroquel last filled 08/14/15 #90 with 3 by Dr. Sherren Mocha

## 2016-08-19 NOTE — Telephone Encounter (Signed)
Pt needs refill 3 months on Quetiatine Fumarate 25 mg, please advise

## 2016-08-20 ENCOUNTER — Telehealth: Payer: Self-pay | Admitting: Family Medicine

## 2016-08-20 NOTE — Telephone Encounter (Signed)
Patient is calling because Dr. Sherren Mocha prescribed Quetiapine Fumarate 25mg  to her and now she is running out.   She called yesterday morning to request that Dr. Birdie Riddle put in an order for this medication, but has not heard back about it.   Please note Dr. Birdie Riddle has not prescribed this medication to this patient before.   Thank you,  -[facility]

## 2016-08-20 NOTE — Telephone Encounter (Signed)
So the number listed below is not in service cannot reach pt to advise that this medication was filled on 08/19/16.

## 2016-11-13 ENCOUNTER — Other Ambulatory Visit: Payer: Self-pay | Admitting: Family Medicine

## 2016-11-14 ENCOUNTER — Encounter: Payer: Self-pay | Admitting: Family Medicine

## 2016-11-14 ENCOUNTER — Ambulatory Visit (INDEPENDENT_AMBULATORY_CARE_PROVIDER_SITE_OTHER): Payer: Medicare Other | Admitting: Family Medicine

## 2016-11-14 VITALS — BP 140/80 | HR 81 | Temp 98.1°F | Resp 16 | Ht 63.0 in | Wt 161.2 lb

## 2016-11-14 DIAGNOSIS — I10 Essential (primary) hypertension: Secondary | ICD-10-CM | POA: Diagnosis not present

## 2016-11-14 DIAGNOSIS — E785 Hyperlipidemia, unspecified: Secondary | ICD-10-CM

## 2016-11-14 LAB — CBC WITH DIFFERENTIAL/PLATELET
Basophils Absolute: 0 10*3/uL (ref 0.0–0.1)
Basophils Relative: 0.6 % (ref 0.0–3.0)
Eosinophils Absolute: 0.1 10*3/uL (ref 0.0–0.7)
Eosinophils Relative: 1.3 % (ref 0.0–5.0)
HCT: 40.6 % (ref 36.0–46.0)
Hemoglobin: 13.5 g/dL (ref 12.0–15.0)
Lymphocytes Relative: 30.5 % (ref 12.0–46.0)
Lymphs Abs: 2.1 10*3/uL (ref 0.7–4.0)
MCHC: 33.1 g/dL (ref 30.0–36.0)
MCV: 93.3 fl (ref 78.0–100.0)
Monocytes Absolute: 0.5 10*3/uL (ref 0.1–1.0)
Monocytes Relative: 6.8 % (ref 3.0–12.0)
Neutro Abs: 4.3 10*3/uL (ref 1.4–7.7)
Neutrophils Relative %: 60.8 % (ref 43.0–77.0)
Platelets: 151 10*3/uL (ref 150.0–400.0)
RBC: 4.35 Mil/uL (ref 3.87–5.11)
RDW: 13.1 % (ref 11.5–15.5)
WBC: 7 10*3/uL (ref 4.0–10.5)

## 2016-11-14 LAB — LIPID PANEL
Cholesterol: 218 mg/dL — ABNORMAL HIGH (ref 0–200)
HDL: 43.8 mg/dL (ref >39.00)
NonHDL: 173.74
Total CHOL/HDL Ratio: 5
Triglycerides: 246 mg/dL — ABNORMAL HIGH (ref 0.0–149.0)
VLDL: 49.2 mg/dL — ABNORMAL HIGH (ref 0.0–40.0)

## 2016-11-14 LAB — HEPATIC FUNCTION PANEL
ALT: 19 U/L (ref 0–35)
AST: 18 U/L (ref 0–37)
Albumin: 3.9 g/dL (ref 3.5–5.2)
Alkaline Phosphatase: 90 U/L (ref 39–117)
Bilirubin, Direct: 0.1 mg/dL (ref 0.0–0.3)
Total Bilirubin: 0.4 mg/dL (ref 0.2–1.2)
Total Protein: 6.9 g/dL (ref 6.0–8.3)

## 2016-11-14 LAB — BASIC METABOLIC PANEL
BUN: 17 mg/dL (ref 6–23)
CO2: 31 mEq/L (ref 19–32)
Calcium: 8.9 mg/dL (ref 8.4–10.5)
Chloride: 107 mEq/L (ref 96–112)
Creatinine, Ser: 0.89 mg/dL (ref 0.40–1.20)
GFR: 64.88 mL/min (ref >60.00)
Glucose, Bld: 72 mg/dL (ref 70–99)
Potassium: 4.2 mEq/L (ref 3.5–5.1)
Sodium: 141 mEq/L (ref 135–145)

## 2016-11-14 LAB — TSH: TSH: 2.09 u[IU]/mL (ref 0.35–4.50)

## 2016-11-14 LAB — LDL CHOLESTEROL, DIRECT: Direct LDL: 122 mg/dL

## 2016-11-14 NOTE — Progress Notes (Signed)
Pre visit review using our clinic review tool, if applicable. No additional management support is needed unless otherwise documented below in the visit note. 

## 2016-11-14 NOTE — Patient Instructions (Signed)
Follow up in 1 month to recheck BP We'll notify you of your lab results and make any changes if needed If the labs look good, the plan is to increase the Valsartan to 320mg  daily Continue to work on healthy diet and regular exercise- you look great! Call with any questions or concerns Have a great weekend!!!

## 2016-11-14 NOTE — Assessment & Plan Note (Signed)
Chronic problem.  Pt has been attempting to control w/ healthy diet and regular exercise rather than starting medication.  Check labs.  Adjust meds prn

## 2016-11-14 NOTE — Assessment & Plan Note (Signed)
Chronic problem.  BP remains above goal.  If Cr and K+ are normal today, will increase Valsartan to 320mg  daily.  Reviewed need for low salt diet and regular exercise.  Will follow closely.

## 2016-11-14 NOTE — Progress Notes (Signed)
   Subjective:    Patient ID: Jacqueline [name], female    DOB: [birth year], 82 y.o.   MRN: [id number]  HPI HTN- chronic problem.  BP remains mildly elevated today on Valsartan 160mg  daily.  Denies CP, SOB, HAs, visual changes, edema.  Pt reports home BP readings are typically 140s-150s.  Hyperlipidemia- chronic problem, pt was attempting to control w/ healthy diet and regular exercise.  She has lost 3 lbs since last visit.  Pt was going to [employer] regularly until she developed R knee and shoulder pain.   Review of Systems For ROS see HPI     Objective:   Physical Exam  Constitutional: She is oriented to person, place, and time. She appears well-developed and well-nourished. No distress.  HENT:  Head: Normocephalic and atraumatic.  Eyes: Conjunctivae and EOM are normal. Pupils are equal, round, and reactive to light.  Neck: Normal range of motion. Neck supple. No thyromegaly present.  Cardiovascular: Normal rate, regular rhythm, normal heart sounds and intact distal pulses.   No murmur heard. Pulmonary/Chest: Effort normal and breath sounds normal. No respiratory distress.  Abdominal: Soft. She exhibits no distension. There is no tenderness.  Musculoskeletal: She exhibits no edema.  Lymphadenopathy:    She has no cervical adenopathy.  Neurological: She is alert and oriented to person, place, and time.  Skin: Skin is warm and dry.  Psychiatric: She has a normal mood and affect. Her behavior is normal.  Vitals reviewed.         Assessment & Plan:

## 2016-11-16 ENCOUNTER — Other Ambulatory Visit: Payer: Self-pay | Admitting: Family Medicine

## 2016-11-18 ENCOUNTER — Other Ambulatory Visit: Payer: Self-pay | Admitting: General Practice

## 2016-11-18 MED ORDER — VALSARTAN 320 MG PO TABS: 320.0000 mg | ORAL_TABLET | Freq: Every day | ORAL | 6 refills | Status: DC

## 2016-12-17 ENCOUNTER — Ambulatory Visit (INDEPENDENT_AMBULATORY_CARE_PROVIDER_SITE_OTHER): Payer: Medicare Other | Admitting: Family Medicine

## 2016-12-17 ENCOUNTER — Encounter: Payer: Self-pay | Admitting: Family Medicine

## 2016-12-17 VITALS — BP 158/88 | HR 67 | Resp 16 | Ht 63.0 in | Wt 158.2 lb

## 2016-12-17 DIAGNOSIS — I10 Essential (primary) hypertension: Secondary | ICD-10-CM | POA: Diagnosis not present

## 2016-12-17 LAB — BASIC METABOLIC PANEL
BUN: 17 mg/dL (ref 6–23)
CO2: 26 mEq/L (ref 19–32)
Calcium: 9.1 mg/dL (ref 8.4–10.5)
Chloride: 110 mEq/L (ref 96–112)
Creatinine, Ser: 0.98 mg/dL (ref 0.40–1.20)
GFR: 58.04 mL/min — ABNORMAL LOW (ref >60.00)
Glucose, Bld: 95 mg/dL (ref 70–99)
Potassium: 4 mEq/L (ref 3.5–5.1)
Sodium: 143 mEq/L (ref 135–145)

## 2016-12-17 MED ORDER — AMLODIPINE BESYLATE 5 MG PO TABS: 5.0000 mg | ORAL_TABLET | Freq: Every day | ORAL | 3 refills | Status: DC

## 2016-12-17 NOTE — Patient Instructions (Signed)
Follow up in 1 month to recheck BP We'll notify you of your lab results and make any changes if needed Continue to work on healthy diet and regular exercise- you're doing great! Continue the Valsartan once daily Add the Amlodipine once daily Call with any questions or concerns Hang in there!!! [place]!!!!

## 2016-12-17 NOTE — Progress Notes (Signed)
   Subjective:    Patient ID: Jacqueline [name], female    DOB: 22-Feb-1935, 82 y.o.   MRN: [id number]  HPI HTN- chronic problem.  Pt's Valsartan was increased to 320mg  daily at last visit.  Home BPs are ranging from 134-167/69-80.  Pt reports the increased Valsartan was effective for the first week but then BPs were climbing.  Home cuff matches office reading today.  Pt reports 'blood pressure is rampant in my family'.  Denies CP, SOB, visual changes, edema.  + HAs.  Has been on Amlodipine in the past w/o difficulty.   Review of Systems For ROS see HPI     Objective:   Physical Exam  Constitutional: She is oriented to person, place, and time. She appears well-developed and well-nourished. No distress.  HENT:  Head: Normocephalic and atraumatic.  Eyes: Conjunctivae and EOM are normal. Pupils are equal, round, and reactive to light.  Neck: Normal range of motion. Neck supple. No thyromegaly present.  Cardiovascular: Normal rate, regular rhythm, normal heart sounds and intact distal pulses.   No murmur heard. Pulmonary/Chest: Effort normal and breath sounds normal. No respiratory distress.  Abdominal: Soft. She exhibits no distension. There is no tenderness.  Musculoskeletal: She exhibits no edema.  Lymphadenopathy:    She has no cervical adenopathy.  Neurological: She is alert and oriented to person, place, and time.  Skin: Skin is warm and dry.  Psychiatric: She has a normal mood and affect. Her behavior is normal.  Vitals reviewed.         Assessment & Plan:

## 2016-12-17 NOTE — Assessment & Plan Note (Signed)
Deteriorated.  Despite doubling her Valsartan at last visit, BP is even higher.  + HAs.  Pt was on Amlodipine previously w/o difficulty but stopped when she became hypotensive 'years ago'.  Will restart 5mg  daily in addition to Valsartan.  Check BMP due to increased ARB dose.  Reviewed supportive care and red flags that should prompt return.  Pt expressed understanding and is in agreement w/ plan.

## 2016-12-17 NOTE — Progress Notes (Signed)
Pre visit review using our clinic review tool, if applicable. No additional management support is needed unless otherwise documented below in the visit note. 

## 2016-12-18 ENCOUNTER — Encounter: Payer: Self-pay | Admitting: General Practice

## 2017-01-17 ENCOUNTER — Encounter: Payer: Self-pay | Admitting: Family Medicine

## 2017-01-17 ENCOUNTER — Ambulatory Visit (INDEPENDENT_AMBULATORY_CARE_PROVIDER_SITE_OTHER): Payer: Medicare Other | Admitting: Family Medicine

## 2017-01-17 VITALS — BP 126/80 | HR 72 | Temp 98.0°F | Resp 16 | Ht 63.0 in | Wt 154.0 lb

## 2017-01-17 DIAGNOSIS — I10 Essential (primary) hypertension: Secondary | ICD-10-CM

## 2017-01-17 NOTE — Assessment & Plan Note (Signed)
BP is well controlled today.  Asymptomatic.  No need for labs.  No med changes at this time.  Will follow.

## 2017-01-17 NOTE — Progress Notes (Signed)
   Subjective:    Patient ID: Jacqueline [name], female    DOB: [birth year], 82 y.o.   MRN: [id number]  HPI HTN- chronic problem.  Amlodipine was added at last visit to Diovan 320mg .  BP is well controlled today.  Home BPs are also better controlled- ranging 128-143/61-69.  Pt reports HAs are improving.  She reports initially she had some lightheadedness after starting Amlodipine but this is better.  No CP, SOB, HAs, visual changes, edema   Review of Systems For ROS see HPI     Objective:   Physical Exam  Constitutional: She is oriented to person, place, and time. She appears well-developed and well-nourished. No distress.  HENT:  Head: Normocephalic and atraumatic.  Eyes: Pupils are equal, round, and reactive to light. Conjunctivae and EOM are normal.  Neck: Normal range of motion. Neck supple. No thyromegaly present.  Cardiovascular: Normal rate, regular rhythm, normal heart sounds and intact distal pulses.   No murmur heard. Pulmonary/Chest: Effort normal and breath sounds normal. No respiratory distress.  Abdominal: Soft. She exhibits no distension. There is no tenderness.  Musculoskeletal: She exhibits no edema.  Lymphadenopathy:    She has no cervical adenopathy.  Neurological: She is alert and oriented to person, place, and time.  Skin: Skin is warm and dry.  Psychiatric: She has a normal mood and affect. Her behavior is normal.  Vitals reviewed.         Assessment & Plan:

## 2017-01-17 NOTE — Progress Notes (Signed)
Pre visit review using our clinic review tool, if applicable. No additional management support is needed unless otherwise documented below in the visit note. 

## 2017-01-17 NOTE — Patient Instructions (Signed)
Follow up as needed/scheduled Your blood pressure looks great!  No changes at this time Continue the Valsartan and the Amlodipine daily Keep up the good work on healthy diet and regular exercise- you're doing great! Call with any questions or concerns Enjoy the rest of your summer!!!

## 2017-02-06 ENCOUNTER — Ambulatory Visit (INDEPENDENT_AMBULATORY_CARE_PROVIDER_SITE_OTHER): Payer: Medicare Other | Admitting: Family Medicine

## 2017-02-06 ENCOUNTER — Encounter: Payer: Self-pay | Admitting: Family Medicine

## 2017-02-06 VITALS — BP 122/83 | HR 76 | Temp 98.1°F | Resp 16 | Ht 63.0 in | Wt 154.2 lb

## 2017-02-06 DIAGNOSIS — M5410 Radiculopathy, site unspecified: Secondary | ICD-10-CM

## 2017-02-06 MED ORDER — PREDNISONE 10 MG PO TABS: ORAL_TABLET | ORAL | 0 refills | Status: DC

## 2017-02-06 NOTE — Patient Instructions (Signed)
Follow up as needed/scheduled Start the Prednisone as directed- take w/ food.  3 tabs at the same time x3 days, then 2 tabs at the same time x3 days, and then 1 tab x3 days HEAT! If no improvement, please let me know!!! Call with any questions or concerns Hang in there!!!

## 2017-02-06 NOTE — Progress Notes (Signed)
Pre visit review using our clinic review tool, if applicable. No additional management support is needed unless otherwise documented below in the visit note. 

## 2017-02-06 NOTE — Progress Notes (Signed)
   Subjective:    Patient ID: Jacqueline [name], female    DOB: Jul 13, 1934, 82 y.o.   MRN: [id number]  HPI Back pain- pt reports this is similar to her ruptured disc 7 yrs ago for which she saw Dr Ellene Route.  Monday she 'turned a certain way' and 'it grabbed me really bad'.  Certain movements cause severe pain- 7-8/10.  Some radiation of pain down R leg.  She called Dr Ellene Route and the soonest appt was October and was told to come here.  Pt is walking and sleeping fine.   Review of Systems For ROS see HPI     Objective:   Physical Exam  Constitutional: She is oriented to person, place, and time. She appears well-developed and well-nourished. No distress.  HENT:  Head: Normocephalic and atraumatic.  Musculoskeletal: She exhibits no tenderness (no TTP over spine, mild TTP over R paraspinal muscle and PSIS).  Neurological: She is alert and oriented to person, place, and time. She has normal reflexes.  (-) SLR bilaterally  Skin: Skin is warm and dry.  Psychiatric: She has a normal mood and affect. Her behavior is normal. Thought content normal.  Vitals reviewed.         Assessment & Plan:  Radicular low back pain- no red flags on hx or PE.  No TTP over spine.  Good ROM, walking comfortably, (-) SLR.  Start Prednisone taper.  Heat.  Pt declined muscle relaxer.  If no improvement w/ Prednisone will get imaging and refer back to Dr Ellene Route.  Reviewed supportive care and red flags that should prompt return.  Pt expressed understanding and is in agreement w/ plan.

## 2017-02-14 ENCOUNTER — Telehealth: Payer: Self-pay | Admitting: General Practice

## 2017-02-14 ENCOUNTER — Other Ambulatory Visit: Payer: Self-pay | Admitting: Family Medicine

## 2017-02-14 ENCOUNTER — Ambulatory Visit: Payer: Medicare Other | Admitting: Family Medicine

## 2017-02-14 MED ORDER — OLMESARTAN MEDOXOMIL 40 MG PO TABS: 40.0000 mg | ORAL_TABLET | Freq: Every day | ORAL | 6 refills | Status: DC

## 2017-02-14 NOTE — Telephone Encounter (Signed)
Please advise, we received a fax from [facility] advising that patient's valsartan was part of the recall. She is currently on Valsartan 320mg 

## 2017-02-14 NOTE — Telephone Encounter (Signed)
Pt made aware and rx sent to new pharmacy.

## 2017-02-14 NOTE — Telephone Encounter (Signed)
Switch to Olmesartan 40 mg daily and monitor BP to make sure it remains at goal

## 2017-02-17 ENCOUNTER — Encounter: Payer: Self-pay | Admitting: Family Medicine

## 2017-02-17 ENCOUNTER — Other Ambulatory Visit: Payer: Self-pay | Admitting: General Practice

## 2017-02-17 ENCOUNTER — Ambulatory Visit (INDEPENDENT_AMBULATORY_CARE_PROVIDER_SITE_OTHER): Payer: Medicare Other | Admitting: Family Medicine

## 2017-02-17 VITALS — BP 126/80 | HR 81 | Temp 98.1°F | Resp 16 | Ht 63.0 in | Wt 152.5 lb

## 2017-02-17 DIAGNOSIS — E785 Hyperlipidemia, unspecified: Secondary | ICD-10-CM | POA: Diagnosis not present

## 2017-02-17 LAB — HEPATIC FUNCTION PANEL
ALT: 21 U/L (ref 0–35)
AST: 16 U/L (ref 0–37)
Albumin: 3.6 g/dL (ref 3.5–5.2)
Alkaline Phosphatase: 86 U/L (ref 39–117)
Bilirubin, Direct: 0.1 mg/dL (ref 0.0–0.3)
Total Bilirubin: 0.5 mg/dL (ref 0.2–1.2)
Total Protein: 7.1 g/dL (ref 6.0–8.3)

## 2017-02-17 LAB — LIPID PANEL
Cholesterol: 201 mg/dL — ABNORMAL HIGH (ref 0–200)
HDL: 42 mg/dL (ref >39.00)
NonHDL: 158.8
Total CHOL/HDL Ratio: 5
Triglycerides: 339 mg/dL — ABNORMAL HIGH (ref 0.0–149.0)
VLDL: 67.8 mg/dL — ABNORMAL HIGH (ref 0.0–40.0)

## 2017-02-17 LAB — LDL CHOLESTEROL, DIRECT: Direct LDL: 115 mg/dL

## 2017-02-17 MED ORDER — FENOFIBRATE 160 MG PO TABS: 160.0000 mg | ORAL_TABLET | Freq: Every day | ORAL | 6 refills | Status: DC

## 2017-02-17 NOTE — Patient Instructions (Signed)
Schedule your complete physical and Medicare Wellness Visit for after 11/17 [facility] notify you of your lab results and make any changes if needed Continue to work on healthy diet and regular exercise- you're doing great! Call with any questions or concerns Happy Labor Day!!!

## 2017-02-17 NOTE — Progress Notes (Signed)
   Subjective:    Patient ID: Jacqueline [name], female    DOB: 17-Nov-1934, 82 y.o.   MRN: [id number]  HPI Hyperlipidemia- pt's Triglycerides were 246 at last check but pt was hesitant to start meds and wanted to work on diet and exercise.  She is down 9 lbs from that visit.  Pt reports she has 'really worked hard' on changing diet.  Has been limited in exercise due to recent back pain.  No CP, SOB, HAs, visual changes.   Review of Systems For ROS see HPI     Objective:   Physical Exam  Constitutional: She is oriented to person, place, and time. She appears well-developed and well-nourished. No distress.  HENT:  Head: Normocephalic and atraumatic.  Eyes: Pupils are equal, round, and reactive to light. Conjunctivae and EOM are normal.  Neck: Normal range of motion. Neck supple. No thyromegaly present.  Cardiovascular: Normal rate, regular rhythm, normal heart sounds and intact distal pulses.   No murmur heard. Pulmonary/Chest: Effort normal and breath sounds normal. No respiratory distress.  Abdominal: Soft. She exhibits no distension. There is no tenderness.  Musculoskeletal: She exhibits no edema.  Lymphadenopathy:    She has no cervical adenopathy.  Neurological: She is alert and oriented to person, place, and time.  Skin: Skin is warm and dry.  Psychiatric: She has a normal mood and affect. Her behavior is normal.  Vitals reviewed.         Assessment & Plan:

## 2017-02-17 NOTE — Progress Notes (Signed)
Pre visit review using our clinic review tool, if applicable. No additional management support is needed unless otherwise documented below in the visit note. 

## 2017-02-17 NOTE — Assessment & Plan Note (Signed)
Chronic problem.  Pt has been trying to manage this w/ healthy diet and regular exercise.  Check labs and adjust tx prn.  Will follow.

## 2017-02-25 ENCOUNTER — Encounter: Payer: Self-pay | Admitting: Family Medicine

## 2017-02-25 ENCOUNTER — Ambulatory Visit (INDEPENDENT_AMBULATORY_CARE_PROVIDER_SITE_OTHER): Payer: Medicare Other | Admitting: Family Medicine

## 2017-02-25 ENCOUNTER — Ambulatory Visit (INDEPENDENT_AMBULATORY_CARE_PROVIDER_SITE_OTHER): Payer: Medicare Other

## 2017-02-25 VITALS — BP 110/81 | HR 80 | Temp 98.1°F | Resp 17 | Ht 63.0 in | Wt 154.2 lb

## 2017-02-25 DIAGNOSIS — M5416 Radiculopathy, lumbar region: Secondary | ICD-10-CM

## 2017-02-25 DIAGNOSIS — M5417 Radiculopathy, lumbosacral region: Secondary | ICD-10-CM | POA: Diagnosis not present

## 2017-02-25 MED ORDER — PREDNISONE 10 MG PO TABS: ORAL_TABLET | ORAL | 0 refills | Status: DC

## 2017-02-25 NOTE — Progress Notes (Signed)
   Subjective:    Patient ID: Jacqueline [name], female    DOB: [birth year], 82 y.o.   MRN: [id number]  HPI Back pain- pt was seen 8/9 for similar and started on Prednisone.  Pt reports pain improved after course of Prednisone but 'never completely went away'.  Then again 4 days ago, 'my back really grabbed me.  Even worse than before'.  Now she is having pain w/ certain movements.  She is having pain on R side, radiating down R leg and around abdomen.  Pt has hx of back issues but Dr Clarice Pole next appt is not until October.  No bowel or bladder incontinence.  Pt was taking advil every 4-6 hrs but this caused nausea.   Review of Systems For ROS see HPI     Objective:   Physical Exam  Constitutional: She is oriented to person, place, and time. She appears well-developed and well-nourished. No distress.  HENT:  Head: Normocephalic and atraumatic.  Cardiovascular: Intact distal pulses.   Musculoskeletal: She exhibits tenderness (TTP over R PSIS, no TTP over spine). She exhibits no edema.  Pain w/ back extension>flexion  Neurological: She is alert and oriented to person, place, and time. She has normal reflexes. No cranial nerve deficit. Coordination normal.  (-) SLR bilaterally  Skin: Skin is warm and dry. No rash noted. No erythema.  Psychiatric: She has a normal mood and affect. Her behavior is normal. Thought content normal.  Vitals reviewed.         Assessment & Plan:  Lumbar radiculopathy- recurrent.  Pt has hx of back issues and has seen Dr Ellene Route in the past but they have not been able to see her.  Restart prednisone.  Get xrays to assess.  Refer back to neurosurg.  Reviewed supportive care and red flags that should prompt return.  Pt expressed understanding and is in agreement w/ plan.

## 2017-02-25 NOTE — Progress Notes (Signed)
Pre visit review using our clinic review tool, if applicable. No additional management support is needed unless otherwise documented below in the visit note. 

## 2017-02-25 NOTE — Patient Instructions (Signed)
Follow up as needed or as scheduled Go to the [facility] and get your xrays done [facility] [employer] call you with your appt at Neurosurgery Start the Prednisone as directed- take w/ food Add Tylenol as needed for breakthrough pain but avoid any addition ibuprofen/motrin/aleve Do some gentle stretching to avoid stiffness Call with any questions or concerns Hang in there!

## 2017-03-20 ENCOUNTER — Telehealth: Payer: Self-pay | Admitting: *Deleted

## 2017-03-20 MED ORDER — PREDNISONE 10 MG PO TABS: ORAL_TABLET | ORAL | 0 refills | Status: DC

## 2017-03-20 NOTE — Telephone Encounter (Signed)
RX sent, patient aware  

## 2017-03-20 NOTE — Telephone Encounter (Signed)
Patient is called stating that she is to see the back specialist October 3rd.    She is leaving for a trip on Saturday, and she got up this morning with the same back issues. She is asking if she could have another dose of prednisone to get her through the trip until she can see the back specialist.   Routed to provider to advise.

## 2017-03-20 NOTE — Telephone Encounter (Signed)
[facility] for refill on Prednisone taper

## 2017-04-08 ENCOUNTER — Other Ambulatory Visit: Payer: Self-pay | Admitting: Family Medicine

## 2017-05-06 ENCOUNTER — Other Ambulatory Visit: Payer: Self-pay | Admitting: General Practice

## 2017-05-06 MED ORDER — MIRTAZAPINE 15 MG PO TABS: 15.0000 mg | ORAL_TABLET | Freq: Every day | ORAL | 1 refills | Status: DC

## 2017-05-09 ENCOUNTER — Encounter: Payer: Self-pay | Admitting: Gastroenterology

## 2017-05-12 ENCOUNTER — Ambulatory Visit (INDEPENDENT_AMBULATORY_CARE_PROVIDER_SITE_OTHER): Payer: Medicare Other

## 2017-05-12 ENCOUNTER — Other Ambulatory Visit: Payer: Self-pay | Admitting: Family Medicine

## 2017-05-12 DIAGNOSIS — Z23 Encounter for immunization: Secondary | ICD-10-CM

## 2017-05-14 ENCOUNTER — Encounter: Payer: Self-pay | Admitting: Physician Assistant

## 2017-05-14 ENCOUNTER — Other Ambulatory Visit: Payer: Self-pay

## 2017-05-14 ENCOUNTER — Ambulatory Visit: Payer: Medicare Other | Admitting: Physician Assistant

## 2017-05-14 VITALS — BP 120/70 | HR 87 | Temp 98.6°F | Resp 14 | Ht 63.0 in | Wt 157.0 lb

## 2017-05-14 DIAGNOSIS — N3001 Acute cystitis with hematuria: Secondary | ICD-10-CM

## 2017-05-14 DIAGNOSIS — R0982 Postnasal drip: Secondary | ICD-10-CM | POA: Diagnosis not present

## 2017-05-14 LAB — POCT URINALYSIS DIPSTICK
Bilirubin, UA: NEGATIVE
Glucose, UA: NEGATIVE
Ketones, UA: NEGATIVE
Spec Grav, UA: 1.015 (ref 1.010–1.025)
Urobilinogen, UA: 0.2 E.U./dL
pH, UA: 6 (ref 5.0–8.0)

## 2017-05-14 MED ORDER — CEPHALEXIN 500 MG PO CAPS: 500.0000 mg | ORAL_CAPSULE | Freq: Two times a day (BID) | ORAL | 0 refills | Status: AC

## 2017-05-14 NOTE — Patient Instructions (Signed)
Your symptoms are consistent with a bladder infection, also called acute cystitis. Please take your antibiotic (Keflex) as directed until all pills are gone.  Stay very well hydrated.  Consider a daily probiotic (Align, Culturelle, or Activia) to help prevent stomach upset caused by the antibiotic.  Taking a probiotic daily may also help prevent recurrent UTIs.  Also consider taking AZO (Phenazopyridine) tablets to help decrease pain with urination.  I will call you with your urine testing results.  We will change antibiotics if indicated.  Call or return to clinic if symptoms are not resolved by completion of antibiotic.   Don't forget to start a saline nasal rinse twice daily to help with nasal drip. Call me if this is not improving.  Urinary Tract Infection A urinary tract infection (UTI) can occur any place along the urinary tract. The tract includes the kidneys, ureters, bladder, and urethra. A type of germ called bacteria often causes a UTI. UTIs are often helped with antibiotic medicine.  HOME CARE   If given, take antibiotics as told by your doctor. Finish them even if you start to feel better.  Drink enough fluids to keep your pee (urine) clear or pale yellow.  Avoid tea, drinks with caffeine, and bubbly (carbonated) drinks.  Pee often. Avoid holding your pee in for a long time.  Pee before and after having sex (intercourse).  Wipe from front to back after you poop (bowel movement) if you are a woman. Use each tissue only once. GET HELP RIGHT AWAY IF:   You have back pain.  You have lower belly (abdominal) pain.  You have chills.  You feel sick to your stomach (nauseous).  You throw up (vomit).  Your burning or discomfort with peeing does not go away.  You have a fever.  Your symptoms are not better in 3 days. MAKE SURE YOU:   Understand these instructions.  Will watch your condition.  Will get help right away if you are not doing well or get worse. Document  Released: 12/04/2007 Document Revised: 03/11/2012 Document Reviewed: 01/16/2012 [facility] Patient Information 2015 [place], [place]. This information is not intended to replace advice given to you by your health care provider. Make sure you discuss any questions you have with your health care provider.

## 2017-05-14 NOTE — Progress Notes (Signed)
Patient presents to clinic today c/o 3 days of dysuria, urinary frequency, urgency and hesitancy. .Denies fever, chills, nausea/vomiting. Denies back pain or abdominal pain. Has + history of UTI and states current symptoms are identical to prior episodes.  Patient also notes slight PND and dry cough after having a cold a couple of weeks ago. Is t  Past Medical History:  Diagnosis Date  . Breast abscess 07/30/2011   Developed in the mammosite cavity and treated with I&D, packed and healed   . Breast cancer ([place]) hx 2007   stage T1b grade 2 ER/positive,ductal ca left breast  . Breast lump    rt  . Cancer ([place]) 2007, 2012   breast- bilateral  . Complication of anesthesia   . Failed total left knee replacement ([place])   . GERD (gastroesophageal reflux disease)   . Glaucoma   . Hyperlipidemia   . Hypertension   . Kidney stones   . Osteopenia   . Osteoporosis   . PONV (postoperative nausea and vomiting)   . Ruptured disc, cervical   . Sleep initiation dysfunction   . Torn rotator cuff 08/2012    Current Outpatient Medications on File Prior to Visit  Medication Sig Dispense Refill  . AMBULATORY NON FORMULARY MEDICATION Take 1 capsule by mouth daily. Medication Name: Tumeric    . amLODipine (NORVASC) 5 MG tablet TAKE 1 TABLET BY MOUTH EVERY DAY 30 tablet 5  . Cholecalciferol (VITAMIN D3) 2000 UNITS TABS Take 2,000 Units by mouth daily.     . clobetasol ointment (TEMOVATE) 8.25 % Apply 1 application topically 2 (two) times daily.    . cromolyn (NASALCROM) 5.2 MG/ACT nasal spray Place 1 spray into both nostrils 4 (four) times daily.    . Cyanocobalamin (VITAMIN B 12 PO) Take 1 capsule by mouth.    . diclofenac sodium (VOLTAREN) 1 % GEL Reported on 11/28/2015  1  . fenofibrate 160 MG tablet Take 1 tablet (160 mg total) by mouth daily. 30 tablet 6  . loperamide (IMODIUM) 2 MG capsule Take 2 mg by mouth as needed for diarrhea or loose stools.    . mirtazapine (REMERON) 15 MG tablet Take 1  tablet (15 mg total) at bedtime by mouth. 90 tablet 1  . olmesartan (BENICAR) 40 MG tablet Take 1 tablet (40 mg total) by mouth daily. 30 tablet 6  . omeprazole (PRILOSEC OTC) 20 MG tablet Take 1 tablet (20 mg total) by mouth daily. 90 tablet 4  . QUEtiapine (SEROQUEL) 25 MG tablet TAKE 1 TABLET (25 MG TOTAL) BY MOUTH AT BEDTIME. 90 tablet 0   No current facility-administered medications on file prior to visit.     Allergies  Allergen Reactions  . Aspirin Nausea Only    REACTION: stomach upset  . Codeine Sulfate Nausea Only    REACTION: stomach upset    Family History  Problem Relation Age of Onset  . Hypertension Mother   . Colon polyps Mother   . Stroke Father   . Cancer Brother        throat  . Colon cancer Neg Hx   . Stomach cancer Neg Hx     Social History   Socioeconomic History  . Marital status: Married    Spouse name: None  . Number of children: None  . Years of education: None  . Highest education level: None  Social Needs  . Financial resource strain: None  . Food insecurity - worry: None  . Food insecurity - inability:  None  . Transportation needs - medical: None  . Transportation needs - non-medical: None  Occupational History  . None  Tobacco Use  . Smoking status: Never Smoker  . Smokeless tobacco: Never Used  Substance and Sexual Activity  . Alcohol use: No    Alcohol/week: 0.0 oz  . Drug use: No  . Sexual activity: Yes    Birth control/protection: Post-menopausal  Other Topics Concern  . None  Social History Narrative  . None   Review of Systems - See HPI.  All other ROS are negative.  BP 120/70   Pulse 87   Temp 98.6 F (37 C) (Oral)   Resp 14   Ht 5\' 3"  (1.6 m)   Wt 157 lb (71.2 kg)   SpO2 97%   BMI 27.81 kg/m   Physical Exam  Constitutional: She is oriented to person, place, and time and well-developed, well-nourished, and in no distress.  HENT:  Head: Normocephalic and atraumatic.  Right Ear: Tympanic membrane normal.    Left Ear: Tympanic membrane normal.  Nose: Rhinorrhea present. No mucosal edema. Right sinus exhibits no maxillary sinus tenderness and no frontal sinus tenderness. Left sinus exhibits no maxillary sinus tenderness and no frontal sinus tenderness.  Mouth/Throat: Uvula is midline, oropharynx is clear and moist and mucous membranes are normal.  Eyes: Conjunctivae are normal.  Neck: Neck supple.  Cardiovascular: Normal rate, regular rhythm, normal heart sounds and intact distal pulses.  Pulmonary/Chest: Effort normal and breath sounds normal. No respiratory distress. She has no wheezes. She has no rales. She exhibits no tenderness.  Abdominal: Soft. Bowel sounds are normal. She exhibits no distension. There is no tenderness.  Negative CVA tenderness.  Lymphadenopathy:    She has no cervical adenopathy.  Neurological: She is alert and oriented to person, place, and time.  Skin: Skin is warm and dry. No rash noted.  Psychiatric: Affect normal.  Vitals reviewed.   Recent Results (from the past 2160 hour(s))  Lipid panel     Status: Abnormal   Collection Time: 02/17/17 11:16 AM  Result Value Ref Range   Cholesterol 201 (H) 0 - 200 mg/dL    Comment: ATP III Classification       Desirable:  < 200 mg/dL               Borderline High:  200 - 239 mg/dL          High:  > = 240 mg/dL   Triglycerides 339.0 (H) 0.0 - 149.0 mg/dL    Comment: Normal:  <150 mg/dLBorderline High:  150 - 199 mg/dL   HDL 42.00 >39.00 mg/dL   VLDL 67.8 (H) 0.0 - 40.0 mg/dL   Total CHOL/HDL Ratio 5     Comment:                Men          Women1/2 Average Risk     3.4          3.3Average Risk          5.0          4.42X Average Risk          9.6          7.13X Average Risk          15.0          11.0  NonHDL 158.80     Comment: NOTE:  Non-HDL goal should be 30 mg/dL higher than patient's LDL goal (i.e. LDL goal of < 70 mg/dL, would have non-HDL goal of < 100 mg/dL)  Hepatic function panel     Status:  None   Collection Time: 02/17/17 11:16 AM  Result Value Ref Range   Total Bilirubin 0.5 0.2 - 1.2 mg/dL   Bilirubin, Direct 0.1 0.0 - 0.3 mg/dL   Alkaline Phosphatase 86 39 - 117 U/L   AST 16 0 - 37 U/L   ALT 21 0 - 35 U/L   Total Protein 7.1 6.0 - 8.3 g/dL   Albumin 3.6 3.5 - 5.2 g/dL  LDL cholesterol, direct     Status: None   Collection Time: 02/17/17 11:16 AM  Result Value Ref Range   Direct LDL 115.0 mg/dL    Comment: Optimal:  <100 mg/dLNear or Above Optimal:  100-129 mg/dLBorderline High:  130-159 mg/dLHigh:  160-189 mg/dLVery High:  >190 mg/dL    Assessment/Plan: 1. Acute cystitis with hematuria Urine dip + blood, LE and nitrites. No alarm symptoms. Culture sent. Will empirically start Keflex as noted below. Will alter based on culture results. ER precautions reviewed with patient.  - POCT Urinalysis Dipstick - cephALEXin (KEFLEX) 500 MG capsule; Take 1 capsule (500 mg total) 2 (two) times daily for 7 days by mouth.  Dispense: 14 capsule; Refill: 0 - Urine Culture  2. PND (post-nasal drip) Start saline nasal rinse. Resume Nasocort. Follow-up if not improving.    Leeanne Rio, PA-C

## 2017-05-14 NOTE — Progress Notes (Signed)
Pre visit review using our clinic review tool, if applicable. No additional management support is needed unless otherwise documented below in the visit note. 

## 2017-05-17 LAB — URINE CULTURE
MICRO NUMBER:: 81284843
SPECIMEN QUALITY:: ADEQUATE

## 2017-05-27 NOTE — Progress Notes (Addendum)
Subjective:   Jacqueline [name] is a 82 y.o. female who presents for Medicare Annual (Subsequent) preventive examination.  Review of Systems:  No ROS.  Medicare Wellness Visit. Additional risk factors are reflected in the social history.  Cardiac Risk Factors include: advanced age (>9men, >68 women);dyslipidemia;hypertension;family history of premature cardiovascular disease   Sleep patterns: Sleeps 7-8 hours.  Home Safety/Smoke Alarms: Feels safe in home. Smoke alarms in place.  Living environment; residence and Firearm Safety: Lives with husband in 1 story home with attic.  Seat Belt Safety/Bike Helmet: Wears seat belt.   Female:   [id number]      Mammo-04/30/2016. [facility]        Dexa scan-07/05/2016, Osteopenia.       CCS-Colonoscopy 06/03/2012, polyp. Recall 5 years. Aged out.      Objective:     Vitals: BP 138/80 (BP Location: Left Arm, Patient Position: Sitting, Cuff Size: Normal)   Pulse 84   Ht 5\' 3"  (1.6 m)   Wt 158 lb 12.8 oz (72 kg)   SpO2 98%   BMI 28.13 kg/m   Body mass index is 28.13 kg/m.   Tobacco Social History   Tobacco Use  Smoking Status Never Smoker  Smokeless Tobacco Never Used     Counseling given: Not Answered   Past Medical History:  Diagnosis Date  . Breast abscess 07/30/2011   Developed in the mammosite cavity and treated with I&D, packed and healed   . Breast cancer ([place]) hx 2007   stage T1b grade 2 ER/positive,ductal ca left breast  . Breast lump    rt  . Cancer ([place]) 2007, 2012   breast- bilateral  . Complication of anesthesia   . Failed total left knee replacement ([place])   . GERD (gastroesophageal reflux disease)   . Glaucoma   . Hyperlipidemia   . Hypertension   . Kidney stones   . Osteopenia   . Osteoporosis   . PONV (postoperative nausea and vomiting)   . Ruptured disc, cervical   . Sleep initiation dysfunction   . Torn rotator cuff 08/2012   Past Surgical History:  Procedure  Laterality Date  . BREAST LUMPECTOMY WITH RADIOACTIVE SEED LOCALIZATION Right 06/14/2016   Procedure: RIGHT BREAST LUMPECTOMY WITH RADIOACTIVE SEED LOCALIZATION;  Surgeon: Autumn Messing III, MD;  Location: [facility];  Service: General;  Laterality: Right;  . CATARACT EXTRACTION    . CHOLECYSTECTOMY    . DILATION AND CURETTAGE OF UTERUS    . INCISE AND DRAIN ABCESS     breast   . JOINT REPLACEMENT    . KIDNEY STONE SURGERY    . MASTECTOMY PARTIAL / LUMPECTOMY  2007   Left - Dr Margot Chimes  . MASTECTOMY PARTIAL / LUMPECTOMY  2009   Right Dr Margot Chimes  . TOTAL KNEE ARTHROPLASTY  05,07   Family History  Problem Relation Age of Onset  . Hypertension Mother   . Colon polyps Mother   . Stroke Father   . Cancer Brother        throat  . Colon cancer Neg Hx   . Stomach cancer Neg Hx    Social History   Substance and Sexual Activity  Sexual Activity Yes  . Birth control/protection: Post-menopausal    Outpatient Encounter Medications as of 05/28/2017  Medication Sig  . AMBULATORY NON FORMULARY MEDICATION Take 1 capsule by mouth daily. Medication Name: Tumeric  . amLODipine (NORVASC) 5 MG tablet TAKE 1 TABLET BY MOUTH EVERY  DAY  . Cholecalciferol (VITAMIN D3) 2000 UNITS TABS Take 2,000 Units by mouth daily.   . clobetasol ointment (TEMOVATE) 2.35 % Apply 1 application topically 2 (two) times daily.  . cromolyn (NASALCROM) 5.2 MG/ACT nasal spray Place 1 spray into both nostrils 4 (four) times daily.  . Cyanocobalamin (VITAMIN B 12 PO) Take 1 capsule by mouth.  . diclofenac sodium (VOLTAREN) 1 % GEL Reported on 11/28/2015  . fenofibrate 160 MG tablet Take 1 tablet (160 mg total) by mouth daily.  Marland Kitchen loperamide (IMODIUM) 2 MG capsule Take 2 mg by mouth as needed for diarrhea or loose stools.  . mirtazapine (REMERON) 15 MG tablet Take 1 tablet (15 mg total) at bedtime by mouth.  . olmesartan (BENICAR) 40 MG tablet Take 1 tablet (40 mg total) by mouth daily.  Marland Kitchen omeprazole (PRILOSEC OTC)  20 MG tablet Take 1 tablet (20 mg total) by mouth daily.  . QUEtiapine (SEROQUEL) 25 MG tablet TAKE 1 TABLET (25 MG TOTAL) BY MOUTH AT BEDTIME.  Marland Kitchen Zoster Vaccine Adjuvanted [facility]) injection Inject 0.5 mLs into the muscle once for 1 dose.   No facility-administered encounter medications on file as of 05/28/2017.     Activities of Daily Living In your present state of health, do you have any difficulty performing the following activities: 05/28/2017 06/14/2016  Hearing? N N  Vision? N N  Difficulty concentrating or making decisions? N N  Walking or climbing stairs? N N  Dressing or bathing? N N  Doing errands, shopping? N -  Preparing Food and eating ? N -  Using the Toilet? N -  In the past six months, have you accidently leaked urine? N -  Do you have problems with loss of bowel control? N -  Managing your Medications? N -  Managing your Finances? N -  Housekeeping or managing your Housekeeping? N -  Some recent data might be hidden    Patient Care Team: Midge Minium, MD as PCP - General (Family Medicine) Luberta Mutter, MD as Consulting Physician (Ophthalmology) Kristeen Miss, MD as Consulting Physician (Neurosurgery) Elsie Saas, MD as Consulting Physician (Orthopedic Surgery) Jari Pigg, MD as Consulting Physician (Dermatology)    Assessment:    Physical assessment deferred to PCP.  Exercise Activities and Dietary recommendations Exercise limited by: None identified   Diet (meal preparation, eat out, water intake, caffeinated beverages, dairy products, fruits and vegetables): Drinks cranberry juice and water.   Eats healthy diet.   Goals    . Patient Stated     Maintain current health      Fall Risk Fall Risk  05/28/2017 05/17/2016 11/28/2015 07/31/2015 07/27/2014  Falls in the past year? No No No No No  Number falls in past yr: - - - - -  Injury with Fall? - - - - -   Depression Screen PHQ 2/9 Scores 05/28/2017 02/17/2017 11/14/2016 05/17/2016   PHQ - 2 Score 0 0 0 0  PHQ- 9 Score - 0 0 0     Cognitive Function MMSE - Mini Mental State Exam 05/28/2017  Orientation to time 5  Orientation to Place 5  Registration 3  Attention/ Calculation 5  Recall 2  Language- name 2 objects 2  Language- repeat 1  Language- follow 3 step command 3  Language- read & follow direction 1  Write a sentence 1  Copy design 1  Total score 29        Immunization History  Administered Date(s) Administered  . Influenza Split  06/06/2011, 04/15/2012, 03/31/2013  . Influenza Whole 04/08/2007, 04/02/2008, 04/20/2009, 04/23/2010  . Influenza,inj,Quad PF,6+ Mos 02/21/2016, 05/12/2017  . Influenza-Unspecified 03/31/2014, 03/02/2015  . Pneumococcal Conjugate-13 06/30/2013  . Pneumococcal Polysaccharide-23 07/01/2001, 04/08/2007, 05/17/2016  . Td 07/01/2002  . Tdap 06/08/2012  . Zoster 07/02/2003   Screening Tests Health Maintenance  Topic Date Due  . COLONOSCOPY  06/03/2017  . TETANUS/TDAP  06/08/2022  . INFLUENZA VACCINE  Completed  . DEXA SCAN  Completed  . PNA vac Low Risk Adult  Completed      Plan:     Shingles vaccine at pharmacy.   Schedule mammogram.   Bring a copy of your living will and/or healthcare power of attorney to your next office visit.  Continue doing brain stimulating activities (puzzles, reading, adult coloring books, staying active) to keep memory sharp.   I have personally reviewed and noted the following in the patient's chart:   . Medical and social history . Use of alcohol, tobacco or illicit drugs  . Current medications and supplements . Functional ability and status . Nutritional status . Physical activity . Advanced directives . List of other physicians . Hospitalizations, surgeries, and ER visits in previous 12 months . Vitals . Screenings to include cognitive, depression, and falls . Referrals and appointments  In addition, I have reviewed and discussed with patient certain preventive  protocols, quality metrics, and best practice recommendations. A written personalized care plan for preventive services as well as general preventive health recommendations were provided to patient.     Gerilyn Nestle, RN  05/28/2017  PCP Notes: -Pt reports feeling fatigued in the evening, she associates with blood pressure. Advised to monitor BP at home, bring reading to next appt.  -Pt brought home BP monitor, consistent with manual reading.  -Next appt with PCP is 06/03/17 (CPE).   Reviewed documentation provided by RN and will follow up BP issues at upcoming CPE.  Annye Asa, MD

## 2017-05-28 ENCOUNTER — Other Ambulatory Visit: Payer: Self-pay

## 2017-05-28 ENCOUNTER — Ambulatory Visit (INDEPENDENT_AMBULATORY_CARE_PROVIDER_SITE_OTHER): Payer: Medicare Other

## 2017-05-28 DIAGNOSIS — Z1231 Encounter for screening mammogram for malignant neoplasm of breast: Secondary | ICD-10-CM | POA: Diagnosis not present

## 2017-05-28 DIAGNOSIS — Z0000 Encounter for general adult medical examination without abnormal findings: Secondary | ICD-10-CM

## 2017-05-28 DIAGNOSIS — Z23 Encounter for immunization: Secondary | ICD-10-CM

## 2017-05-28 DIAGNOSIS — Z1239 Encounter for other screening for malignant neoplasm of breast: Secondary | ICD-10-CM

## 2017-05-28 MED ORDER — ZOSTER VAC RECOMB ADJUVANTED 50 MCG/0.5ML IM SUSR: 0.5000 mL | Freq: Once | INTRAMUSCULAR | 1 refills | Status: AC

## 2017-05-28 NOTE — Patient Instructions (Addendum)
Shingles vaccine at pharmacy.   Schedule mammogram.   Bring a copy of your living will and/or healthcare power of attorney to your next office visit.  Continue doing brain stimulating activities (puzzles, reading, adult coloring books, staying active) to keep memory sharp.    Fall Prevention in the Home Falls can cause injuries. They can happen to people of all ages. There are many things you can do to make your home safe and to help prevent falls. What can I do on the outside of my home?  Regularly fix the edges of walkways and driveways and fix any cracks.  Remove anything that might make you trip as you walk through a door, such as a raised step or threshold.  Trim any bushes or trees on the path to your home.  Use bright outdoor lighting.  Clear any walking paths of anything that might make someone trip, such as rocks or tools.  Regularly check to see if handrails are loose or broken. Make sure that both sides of any steps have handrails.  Any raised decks and porches should have guardrails on the edges.  Have any leaves, snow, or ice cleared regularly.  Use sand or salt on walking paths during winter.  Clean up any spills in your garage right away. This includes oil or grease spills. What can I do in the bathroom?  Use night lights.  Install grab bars by the toilet and in the tub and shower. Do not use towel bars as grab bars.  Use non-skid mats or decals in the tub or shower.  If you need to sit down in the shower, use a plastic, non-slip stool.  Keep the floor dry. Clean up any water that spills on the floor as soon as it happens.  Remove soap buildup in the tub or shower regularly.  Attach bath mats securely with double-sided non-slip rug tape.  Do not have throw rugs and other things on the floor that can make you trip. What can I do in the bedroom?  Use night lights.  Make sure that you have a light by your bed that is easy to reach.  Do not use any  sheets or blankets that are too big for your bed. They should not hang down onto the floor.  Have a firm chair that has side arms. You can use this for support while you get dressed.  Do not have throw rugs and other things on the floor that can make you trip. What can I do in the kitchen?  Clean up any spills right away.  Avoid walking on wet floors.  Keep items that you use a lot in easy-to-reach places.  If you need to reach something above you, use a strong step stool that has a grab bar.  Keep electrical cords out of the way.  Do not use floor polish or wax that makes floors slippery. If you must use wax, use non-skid floor wax.  Do not have throw rugs and other things on the floor that can make you trip. What can I do with my stairs?  Do not leave any items on the stairs.  Make sure that there are handrails on both sides of the stairs and use them. Fix handrails that are broken or loose. Make sure that handrails are as long as the stairways.  Check any carpeting to make sure that it is firmly attached to the stairs. Fix any carpet that is loose or worn.  Avoid having throw  rugs at the top or bottom of the stairs. If you do have throw rugs, attach them to the floor with carpet tape.  Make sure that you have a light switch at the top of the stairs and the bottom of the stairs. If you do not have them, ask someone to add them for you. What else can I do to help prevent falls?  Wear shoes that: ? Do not have high heels. ? Have rubber bottoms. ? Are comfortable and fit you well. ? Are closed at the toe. Do not wear sandals.  If you use a stepladder: ? Make sure that it is fully opened. Do not climb a closed stepladder. ? Make sure that both sides of the stepladder are locked into place. ? Ask someone to hold it for you, if possible.  Clearly mark and make sure that you can see: ? Any grab bars or handrails. ? First and last steps. ? Where the edge of each step  is.  Use tools that help you move around (mobility aids) if they are needed. These include: ? Canes. ? Walkers. ? Scooters. ? Crutches.  Turn on the lights when you go into a dark area. Replace any light bulbs as soon as they burn out.  Set up your furniture so you have a clear path. Avoid moving your furniture around.  If any of your floors are uneven, fix them.  If there are any pets around you, be aware of where they are.  Review your medicines with your doctor. Some medicines can make you feel dizzy. This can increase your chance of falling. Ask your doctor what other things that you can do to help prevent falls. This information is not intended to replace advice given to you by your health care provider. Make sure you discuss any questions you have with your health care provider. Document Released: 04/13/2009 Document Revised: 11/23/2015 Document Reviewed: 07/22/2014 Elsevier Interactive Patient Education  2018 [employer] Maintenance, Female Adopting a healthy lifestyle and getting preventive care can go a long way to promote health and wellness. Talk with your health care provider about what schedule of regular examinations is right for you. This is a good chance for you to check in with your provider about disease prevention and staying healthy. In between checkups, there are plenty of things you can do on your own. Experts have done a lot of research about which lifestyle changes and preventive measures are most likely to keep you healthy. Ask your health care provider for more information. Weight and diet Eat a healthy diet  Be sure to include plenty of vegetables, fruits, low-fat dairy products, and lean protein.  Do not eat a lot of foods high in solid fats, added sugars, or salt.  Get regular exercise. This is one of the most important things you can do for your health. ? Most adults should exercise for at least 150 minutes each week. The exercise should  increase your heart rate and make you sweat (moderate-intensity exercise). ? Most adults should also do strengthening exercises at least twice a week. This is in addition to the moderate-intensity exercise.  Maintain a healthy weight  Body mass index (BMI) is a measurement that can be used to identify possible weight problems. It estimates body fat based on height and weight. Your health care provider can help determine your BMI and help you achieve or maintain a healthy weight.  For females 33 years of age and older: ? A  BMI below 18.5 is considered underweight. ? A BMI of 18.5 to 24.9 is normal. ? A BMI of 25 to 29.9 is considered overweight. ? A BMI of 30 and above is considered obese.  Watch levels of cholesterol and blood lipids  You should start having your blood tested for lipids and cholesterol at 80 years of age, then have this test every 5 years.  You may need to have your cholesterol levels checked more often if: ? Your lipid or cholesterol levels are high. ? You are older than 80 years of age. ? You are at high risk for heart disease.  Cancer screening Lung Cancer  Lung cancer screening is recommended for adults 36-42 years old who are at high risk for lung cancer because of a history of smoking.  A yearly low-dose CT scan of the lungs is recommended for people who: ? Currently smoke. ? Have quit within the past 15 years. ? Have at least a 30-pack-year history of smoking. A pack year is smoking an average of one pack of cigarettes a day for 1 year.  Yearly screening should continue until it has been 15 years since you quit.  Yearly screening should stop if you develop a health problem that would prevent you from having lung cancer treatment.  Breast Cancer  Practice breast self-awareness. This means understanding how your breasts normally appear and feel.  It also means doing regular breast self-exams. Let your health care provider know about any changes, no matter  how small.  If you are in your 20s or 30s, you should have a clinical breast exam (CBE) by a health care provider every 1-3 years as part of a regular health exam.  If you are 42 or older, have a CBE every year. Also consider having a breast X-ray (mammogram) every year.  If you have a family history of breast cancer, talk to your health care provider about genetic screening.  If you are at high risk for breast cancer, talk to your health care provider about having an MRI and a mammogram every year.  Breast cancer gene (BRCA) assessment is recommended for women who have family members with BRCA-related cancers. BRCA-related cancers include: ? Breast. ? Ovarian. ? Tubal. ? Peritoneal cancers.  Results of the assessment will determine the need for genetic counseling and BRCA1 and BRCA2 testing.  Cervical Cancer Your health care provider may recommend that you be screened regularly for cancer of the pelvic organs (ovaries, uterus, and vagina). This screening involves a pelvic examination, including checking for microscopic changes to the surface of your cervix (Pap test). You may be encouraged to have this screening done every 3 years, beginning at age 43.  For women ages 38-65, health care providers may recommend pelvic exams and Pap testing every 3 years, or they may recommend the Pap and pelvic exam, combined with testing for human papilloma virus (HPV), every 5 years. Some types of HPV increase your risk of cervical cancer. Testing for HPV may also be done on women of any age with unclear Pap test results.  Other health care providers may not recommend any screening for nonpregnant women who are considered low risk for pelvic cancer and who do not have symptoms. Ask your health care provider if a screening pelvic exam is right for you.  If you have had past treatment for cervical cancer or a condition that could lead to cancer, you need Pap tests and screening for cancer for at least 20  years  after your treatment. If Pap tests have been discontinued, your risk factors (such as having a new sexual partner) need to be reassessed to determine if screening should resume. Some women have medical problems that increase the chance of getting cervical cancer. In these cases, your health care provider may recommend more frequent screening and Pap tests.  Colorectal Cancer  This type of cancer can be detected and often prevented.  Routine colorectal cancer screening usually begins at 80 years of age and continues through 80 years of age.  Your health care provider may recommend screening at an earlier age if you have risk factors for colon cancer.  Your health care provider may also recommend using home test kits to check for hidden blood in the stool.  A small camera at the end of a tube can be used to examine your colon directly (sigmoidoscopy or colonoscopy). This is done to check for the earliest forms of colorectal cancer.  Routine screening usually begins at age 67.  Direct examination of the colon should be repeated every 5-10 years through 80 years of age. However, you may need to be screened more often if early forms of precancerous polyps or small growths are found.  Skin Cancer  Check your skin from head to toe regularly.  Tell your health care provider about any new moles or changes in moles, especially if there is a change in a mole's shape or color.  Also tell your health care provider if you have a mole that is larger than the size of a pencil eraser.  Always use sunscreen. Apply sunscreen liberally and repeatedly throughout the day.  Protect yourself by wearing long sleeves, pants, a wide-brimmed hat, and sunglasses whenever you are outside.  Heart disease, diabetes, and high blood pressure  High blood pressure causes heart disease and increases the risk of stroke. High blood pressure is more likely to develop in: ? People who have blood pressure in the high  end of the normal range (130-139/85-89 mm Hg). ? People who are overweight or obese. ? People who are African American.  If you are 105-76 years of age, have your blood pressure checked every 3-5 years. If you are 38 years of age or older, have your blood pressure checked every year. You should have your blood pressure measured twice-once when you are at a hospital or clinic, and once when you are not at a hospital or clinic. Record the average of the two measurements. To check your blood pressure when you are not at a hospital or clinic, you can use: ? An automated blood pressure machine at a pharmacy. ? A home blood pressure monitor.  If you are between 62 years and 50 years old, ask your health care provider if you should take aspirin to prevent strokes.  Have regular diabetes screenings. This involves taking a blood sample to check your fasting blood sugar level. ? If you are at a normal weight and have a low risk for diabetes, have this test once every three years after 80 years of age. ? If you are overweight and have a high risk for diabetes, consider being tested at a younger age or more often. Preventing infection Hepatitis B  If you have a higher risk for hepatitis B, you should be screened for this virus. You are considered at high risk for hepatitis B if: ? You were born in a country where hepatitis B is common. Ask your health care provider which countries are considered high  risk. ? Your parents were born in a high-risk country, and you have not been immunized against hepatitis B (hepatitis B vaccine). ? You have HIV or AIDS. ? You use needles to inject street drugs. ? You live with someone who has hepatitis B. ? You have had sex with someone who has hepatitis B. ? You get hemodialysis treatment. ? You take certain medicines for conditions, including cancer, organ transplantation, and autoimmune conditions.  Hepatitis C  Blood testing is recommended for: ? Everyone born from  [birth year] through 1965. ? Anyone with known risk factors for hepatitis C.  Sexually transmitted infections (STIs)  You should be screened for sexually transmitted infections (STIs) including gonorrhea and chlamydia if: ? You are sexually active and are younger than 80 years of age. ? You are older than 80 years of age and your health care provider tells you that you are at risk for this type of infection. ? Your sexual activity has changed since you were last screened and you are at an increased risk for chlamydia or gonorrhea. Ask your health care provider if you are at risk.  If you do not have HIV, but are at risk, it may be recommended that you take a prescription medicine daily to prevent HIV infection. This is called pre-exposure prophylaxis (PrEP). You are considered at risk if: ? You are sexually active and do not regularly use condoms or know the HIV status of your partner(s). ? You take drugs by injection. ? You are sexually active with a partner who has HIV.  Talk with your health care provider about whether you are at high risk of being infected with HIV. If you choose to begin PrEP, you should first be tested for HIV. You should then be tested every 3 months for as long as you are taking PrEP. Pregnancy  If you are premenopausal and you may become pregnant, ask your health care provider about preconception counseling.  If you may become pregnant, take 400 to 800 micrograms (mcg) of folic acid every day.  If you want to prevent pregnancy, talk to your health care provider about birth control (contraception). Osteoporosis and menopause  Osteoporosis is a disease in which the bones lose minerals and strength with aging. This can result in serious bone fractures. Your risk for osteoporosis can be identified using a bone density scan.  If you are 1 years of age or older, or if you are at risk for osteoporosis and fractures, ask your health care provider if you should be  screened.  Ask your health care provider whether you should take a calcium or vitamin D supplement to lower your risk for osteoporosis.  Menopause may have certain physical symptoms and risks.  Hormone replacement therapy may reduce some of these symptoms and risks. Talk to your health care provider about whether hormone replacement therapy is right for you. Follow these instructions at home:  Schedule regular health, dental, and eye exams.  Stay current with your immunizations.  Do not use any tobacco products including cigarettes, chewing tobacco, or electronic cigarettes.  If you are pregnant, do not drink alcohol.  If you are breastfeeding, limit how much and how often you drink alcohol.  Limit alcohol intake to no more than 1 drink per day for nonpregnant women. One drink equals 12 ounces of beer, 5 ounces of wine, or 1 ounces of hard liquor.  Do not use street drugs.  Do not share needles.  Ask your health care provider for help  if you need support or information about quitting drugs.  Tell your health care provider if you often feel depressed.  Tell your health care provider if you have ever been abused or do not feel safe at home. This information is not intended to replace advice given to you by your health care provider. Make sure you discuss any questions you have with your health care provider. Document Released: 12/31/2010 Document Revised: 11/23/2015 Document Reviewed: 03/21/2015 Elsevier Interactive Patient Education  [employer].

## 2017-06-03 ENCOUNTER — Encounter: Payer: Self-pay | Admitting: Family Medicine

## 2017-06-03 ENCOUNTER — Ambulatory Visit: Payer: Medicare Other | Admitting: Family Medicine

## 2017-06-03 ENCOUNTER — Other Ambulatory Visit: Payer: Self-pay

## 2017-06-03 VITALS — BP 123/72 | HR 80 | Temp 98.0°F | Resp 16 | Ht 63.0 in | Wt 158.2 lb

## 2017-06-03 DIAGNOSIS — Z0001 Encounter for general adult medical examination with abnormal findings: Secondary | ICD-10-CM | POA: Diagnosis not present

## 2017-06-03 DIAGNOSIS — Z0000 Encounter for general adult medical examination without abnormal findings: Secondary | ICD-10-CM

## 2017-06-03 DIAGNOSIS — M8580 Other specified disorders of bone density and structure, unspecified site: Secondary | ICD-10-CM

## 2017-06-03 DIAGNOSIS — I10 Essential (primary) hypertension: Secondary | ICD-10-CM

## 2017-06-03 DIAGNOSIS — R413 Other amnesia: Secondary | ICD-10-CM | POA: Diagnosis not present

## 2017-06-03 LAB — BASIC METABOLIC PANEL
BUN: 21 mg/dL (ref 6–23)
CO2: 28 mEq/L (ref 19–32)
Calcium: 9.8 mg/dL (ref 8.4–10.5)
Chloride: 109 mEq/L (ref 96–112)
Creatinine, Ser: 1.04 mg/dL (ref 0.40–1.20)
GFR: 54.13 mL/min — ABNORMAL LOW (ref >60.00)
Glucose, Bld: 91 mg/dL (ref 70–99)
Potassium: 3.7 mEq/L (ref 3.5–5.1)
Sodium: 142 mEq/L (ref 135–145)

## 2017-06-03 LAB — CBC WITH DIFFERENTIAL/PLATELET
Basophils Absolute: 0 10*3/uL (ref 0.0–0.1)
Basophils Relative: 0.3 % (ref 0.0–3.0)
Eosinophils Absolute: 0.1 10*3/uL (ref 0.0–0.7)
Eosinophils Relative: 1 % (ref 0.0–5.0)
HCT: 39.1 % (ref 36.0–46.0)
Hemoglobin: 12.9 g/dL (ref 12.0–15.0)
Lymphocytes Relative: 20.6 % (ref 12.0–46.0)
Lymphs Abs: 1.8 10*3/uL (ref 0.7–4.0)
MCHC: 32.9 g/dL (ref 30.0–36.0)
MCV: 94.7 fl (ref 78.0–100.0)
Monocytes Absolute: 0.7 10*3/uL (ref 0.1–1.0)
Monocytes Relative: 8.2 % (ref 3.0–12.0)
Neutro Abs: 6.1 10*3/uL (ref 1.4–7.7)
Neutrophils Relative %: 69.9 % (ref 43.0–77.0)
Platelets: 204 10*3/uL (ref 150.0–400.0)
RBC: 4.13 Mil/uL (ref 3.87–5.11)
RDW: 13.2 % (ref 11.5–15.5)
WBC: 8.8 10*3/uL (ref 4.0–10.5)

## 2017-06-03 LAB — LIPID PANEL
Cholesterol: 198 mg/dL (ref 0–200)
HDL: 56.4 mg/dL (ref >39.00)
LDL Cholesterol: 123 mg/dL — ABNORMAL HIGH (ref 0–99)
NonHDL: 141.95
Total CHOL/HDL Ratio: 4
Triglycerides: 94 mg/dL (ref 0.0–149.0)
VLDL: 18.8 mg/dL (ref 0.0–40.0)

## 2017-06-03 LAB — HEPATIC FUNCTION PANEL
ALT: 27 U/L (ref 0–35)
AST: 22 U/L (ref 0–37)
Albumin: 4 g/dL (ref 3.5–5.2)
Alkaline Phosphatase: 57 U/L (ref 39–117)
Bilirubin, Direct: 0.1 mg/dL (ref 0.0–0.3)
Total Bilirubin: 0.6 mg/dL (ref 0.2–1.2)
Total Protein: 6.7 g/dL (ref 6.0–8.3)

## 2017-06-03 LAB — TSH: TSH: 1.16 u[IU]/mL (ref 0.35–4.50)

## 2017-06-03 LAB — VITAMIN D 25 HYDROXY (VIT D DEFICIENCY, FRACTURES): VITD: 19.48 ng/mL — ABNORMAL LOW (ref 30.00–100.00)

## 2017-06-03 NOTE — Progress Notes (Signed)
   Subjective:    Patient ID: Jacqueline [name], female    DOB: 1934/11/06, 82 y.o.   MRN: [id number]  HPI CPE- no need for colonoscopy due to age, mammo ordered.  UTD on DEXA.   Review of Systems Patient reports no vision/ hearing changes, adenopathy,fever, weight change,  persistant/recurrent hoarseness , swallowing issues, chest pain, palpitations, edema, persistant/recurrent cough, hemoptysis, dyspnea (rest/exertional/paroxysmal nocturnal), gastrointestinal bleeding (melena, rectal bleeding), abdominal pain, significant heartburn, bowel changes, GU symptoms (dysuria, hematuria, incontinence), Gyn symptoms (abnormal  bleeding, pain),  syncope, focal weakness, memory loss, numbness & tingling, skin/hair/nail changes, abnormal bruising or bleeding, anxiety, or depression.   Pt denies memory difficulties but doesn't remember having lumpectomy in December but remembers meeting Dr Marlou Starks and subsequently seeing Dr Lindi Adie.  + late afternoon fatigue that pt associates w/ Fenofibrate- felt better the day she forgot her meds    Objective:   Physical Exam General Appearance:    Alert, cooperative, no distress, appears stated age  Head:    Normocephalic, without obvious abnormality, atraumatic  Eyes:    PERRL, conjunctiva/corneas clear, EOM's intact, fundi    benign, both eyes  Ears:    Normal TM's and external ear canals, both ears  Nose:   Nares normal, septum midline, mucosa normal, no drainage    or sinus tenderness  Throat:   Lips, mucosa, and tongue normal; teeth and gums normal  Neck:   Supple, symmetrical, trachea midline, no adenopathy;    Thyroid: no enlargement/tenderness/nodules  Back:     Symmetric, no curvature, ROM normal, no CVA tenderness  Lungs:     Clear to auscultation bilaterally, respirations unlabored  Chest Wall:    No tenderness or deformity   Heart:    Regular rate and rhythm, S1 and S2 normal, no murmur, rub   or gallop  Breast Exam:    Deferred to mammo  Abdomen:      Soft, non-tender, bowel sounds active all four quadrants,    no masses, no organomegaly  Genitalia:    Deferred  Rectal:    Extremities:   Extremities normal, atraumatic, no cyanosis or edema  Pulses:   2+ and symmetric all extremities  Skin:   Skin color, texture, turgor normal, no rashes or lesions  Lymph nodes:   Cervical, supraclavicular, and axillary nodes normal  Neurologic:   CNII-XII intact, normal strength, sensation and reflexes    throughout          Assessment & Plan:

## 2017-06-03 NOTE — Assessment & Plan Note (Signed)
Chronic problem.  UTD on DEXA.  Check Vit D level and replete prn. 

## 2017-06-03 NOTE — Assessment & Plan Note (Signed)
New.  Pt and I had a 25 minute discussion about the sequence of events last year surrounding mammo, then callback for diagnostic mammo w/ seed placement, the surgery for breast bx and DX of LCIS which prompted oncology f/u.  Pt remembers meeting Dr Marlou Starks and Dr Lindi Adie but is adamant that the other things- including surgery- never happened.  She is going to ask her family to confirm and review her insurance information.  She denies memory issues but this interaction today is very concerning and if family confirms she did in fact have a lumpectomy, I would like to refer her for Neuropsych testing.

## 2017-06-03 NOTE — Patient Instructions (Addendum)
Follow up 6-8 weeks to recheck fatigue We'll notify you of your lab results and make any changes if needed STOP the fenofibrate CONTINUE the Olmesartan and Amlodipine for blood pressure SCHEDULE your mammogram!!! According to our records, you had your diagnostic mammogram w/ clip placement in your R breast on 05/06/2016, a R lumpectomy w/ Dr Marlou Starks on 06/14/16, and a follow up appt w/ Dr Lindi Adie (oncology) 07/29/16 Call with any questions or concerns Happy Holidays!!!

## 2017-06-03 NOTE — Assessment & Plan Note (Signed)
Chronic problem.  Well controlled today.  Asymptomatic.  Check labs.  No anticipated med changes.  Will follow. 

## 2017-06-03 NOTE — Assessment & Plan Note (Signed)
Pt's PE WNL.  UTD on DEXA, due for mammo (which prompted the entire discussion about her diagnostic mammo w/ seed placement, lumpectomy, and subsequent oncology appt- most of which she doesn't remember).  No longer requires colonoscopy due to age.  Check labs.  Anticipatory guidance provided.

## 2017-06-05 ENCOUNTER — Other Ambulatory Visit: Payer: Self-pay | Admitting: General Practice

## 2017-06-05 MED ORDER — VITAMIN D (ERGOCALCIFEROL) 1.25 MG (50000 UNIT) PO CAPS: 50000.0000 [IU] | ORAL_CAPSULE | ORAL | 0 refills | Status: DC

## 2017-06-07 ENCOUNTER — Other Ambulatory Visit: Payer: Self-pay | Admitting: Family Medicine

## 2017-06-07 DIAGNOSIS — Z853 Personal history of malignant neoplasm of breast: Secondary | ICD-10-CM

## 2017-06-11 ENCOUNTER — Ambulatory Visit: Payer: Medicare Other | Admitting: Family Medicine

## 2017-06-11 ENCOUNTER — Encounter: Payer: Self-pay | Admitting: Family Medicine

## 2017-06-11 ENCOUNTER — Other Ambulatory Visit: Payer: Self-pay

## 2017-06-11 VITALS — BP 121/81 | HR 88 | Temp 98.1°F | Resp 17 | Ht 63.0 in | Wt 155.4 lb

## 2017-06-11 DIAGNOSIS — R110 Nausea: Secondary | ICD-10-CM

## 2017-06-11 NOTE — Patient Instructions (Signed)
Follow up as needed or as scheduled Start the OTC Vit D supplement of 5,000 units daily STOP the prescription supplement Drink plenty of fluids Call with any questions or concerns Hang in there! Happy Holidays!

## 2017-06-11 NOTE — Progress Notes (Signed)
   Subjective:    Patient ID: Jacqueline [name], female    DOB: 1935-05-21, 82 y.o.   MRN: [id number]  HPI Nausea- 'I have a sneakin' feeling that i'm reacting to the Vit D'.  Took first supplement on Thursday and 'by Friday I was just so nauseous'.  Has decreased appetite, metallic taste.  Feels she had a similar reaction last year when started on Vit D.  Pt reports nausea is improved today.   Review of Systems For ROS see HPI     Objective:   Physical Exam  Constitutional: She is oriented to person, place, and time. She appears well-developed and well-nourished. No distress.  HENT:  Head: Normocephalic and atraumatic.  Right Ear: Tympanic membrane normal.  Left Ear: Tympanic membrane normal.  Nose: Mucosal edema and rhinorrhea present. Right sinus exhibits no maxillary sinus tenderness and no frontal sinus tenderness. Left sinus exhibits no maxillary sinus tenderness and no frontal sinus tenderness.  Mouth/Throat: Mucous membranes are normal. Posterior oropharyngeal erythema (w/ PND) present.  Eyes: Conjunctivae and EOM are normal. Pupils are equal, round, and reactive to light.  Neck: Normal range of motion. Neck supple.  Cardiovascular: Normal rate, regular rhythm and normal heart sounds.  Pulmonary/Chest: Effort normal and breath sounds normal. No respiratory distress. She has no wheezes. She has no rales.  Abdominal: Soft. Bowel sounds are normal. She exhibits no distension. There is no tenderness. There is no rebound and no guarding.  Lymphadenopathy:    She has no cervical adenopathy.  Neurological: She is alert and oriented to person, place, and time.  Psychiatric: She has a normal mood and affect. Her behavior is normal. Thought content normal.  Vitals reviewed.         Assessment & Plan:  Nausea- new.  I suspect this is related to pt's PND but she is convinced this is related to her 50,000 units of Vit D.  Based on this, will stop Vit D weekly and starting daily OTC  supplement.  Reviewed supportive care and red flags that should prompt return.  Pt expressed understanding and is in agreement w/ plan.

## 2017-06-25 DIAGNOSIS — M5416 Radiculopathy, lumbar region: Secondary | ICD-10-CM | POA: Insufficient documentation

## 2017-07-03 ENCOUNTER — Ambulatory Visit
Admission: RE | Admit: 2017-07-03 | Discharge: 2017-07-03 | Disposition: A | Discharge status: Home / Self Care | Payer: Medicare Other | Source: Ambulatory Visit | Attending: Family Medicine | Admitting: Family Medicine

## 2017-07-03 ENCOUNTER — Other Ambulatory Visit: Payer: Self-pay | Admitting: General Surgery

## 2017-07-03 DIAGNOSIS — Z853 Personal history of malignant neoplasm of breast: Secondary | ICD-10-CM

## 2017-07-03 DIAGNOSIS — R921 Mammographic calcification found on diagnostic imaging of breast: Secondary | ICD-10-CM

## 2017-07-03 HISTORY — DX: Personal history of irradiation: Z92.3

## 2017-07-03 HISTORY — DX: Personal history of antineoplastic chemotherapy: Z92.21

## 2017-07-07 ENCOUNTER — Other Ambulatory Visit: Payer: Self-pay | Admitting: *Deleted

## 2017-07-07 ENCOUNTER — Other Ambulatory Visit (INDEPENDENT_AMBULATORY_CARE_PROVIDER_SITE_OTHER): Payer: Medicare Other

## 2017-07-07 ENCOUNTER — Other Ambulatory Visit: Payer: Medicare Other

## 2017-07-07 DIAGNOSIS — M5416 Radiculopathy, lumbar region: Secondary | ICD-10-CM

## 2017-07-07 LAB — BASIC METABOLIC PANEL
BUN: 17 mg/dL (ref 6–23)
CO2: 27 mEq/L (ref 19–32)
Calcium: 9.3 mg/dL (ref 8.4–10.5)
Chloride: 108 mEq/L (ref 96–112)
Creatinine, Ser: 0.98 mg/dL (ref 0.40–1.20)
GFR: 57.96 mL/min — ABNORMAL LOW (ref >60.00)
Glucose, Bld: 88 mg/dL (ref 70–99)
Potassium: 3.9 mEq/L (ref 3.5–5.1)
Sodium: 142 mEq/L (ref 135–145)

## 2017-07-08 ENCOUNTER — Encounter: Payer: Self-pay | Admitting: General Practice

## 2017-07-10 ENCOUNTER — Ambulatory Visit
Admission: RE | Admit: 2017-07-10 | Discharge: 2017-07-10 | Disposition: A | Discharge status: Home / Self Care | Payer: Medicare Other | Source: Ambulatory Visit | Attending: General Surgery | Admitting: General Surgery

## 2017-07-10 DIAGNOSIS — R921 Mammographic calcification found on diagnostic imaging of breast: Secondary | ICD-10-CM

## 2017-07-11 ENCOUNTER — Other Ambulatory Visit: Payer: Self-pay | Admitting: General Surgery

## 2017-07-11 DIAGNOSIS — N63 Unspecified lump in unspecified breast: Secondary | ICD-10-CM

## 2017-07-15 ENCOUNTER — Ambulatory Visit
Admission: RE | Admit: 2017-07-15 | Discharge: 2017-07-15 | Disposition: A | Discharge status: Home / Self Care | Payer: Medicare Other | Source: Ambulatory Visit | Attending: General Surgery | Admitting: General Surgery

## 2017-07-15 DIAGNOSIS — N63 Unspecified lump in unspecified breast: Secondary | ICD-10-CM

## 2017-07-17 ENCOUNTER — Ambulatory Visit: Payer: Medicare Other | Admitting: Family Medicine

## 2017-07-17 ENCOUNTER — Encounter: Payer: Self-pay | Admitting: Family Medicine

## 2017-07-17 ENCOUNTER — Other Ambulatory Visit: Payer: Self-pay

## 2017-07-17 VITALS — BP 122/82 | HR 78 | Temp 97.9°F | Resp 16 | Ht 63.0 in | Wt 160.4 lb

## 2017-07-17 DIAGNOSIS — R5383 Other fatigue: Secondary | ICD-10-CM

## 2017-07-17 DIAGNOSIS — E559 Vitamin D deficiency, unspecified: Secondary | ICD-10-CM | POA: Insufficient documentation

## 2017-07-17 NOTE — Assessment & Plan Note (Signed)
Pt has been intolerant to all strengths of Vit D higher than 2,000 units.  Continue 2,000 units daily.  Will follow.

## 2017-07-17 NOTE — Patient Instructions (Signed)
Follow up the beginning of June to recheck BP and cholesterol No more Fenofibrate! Continue the Vit D at 2,000 units Call with any questions or concerns Happy New Year!!

## 2017-07-17 NOTE — Progress Notes (Signed)
   Subjective:    Patient ID: Jacqueline [name], female    DOB: 07/24/1934, 83 y.o.   MRN: [id number]  HPI Fatigue- pt was convinced in December that her Fenofibrate was the cause of her fatigue.  I told her to stop this and see how she felt.  Pt reports 'I feel better'.  'overall I feel much much better'.  Vit D deficiency- pt was not able to tolerate 50,000 units weekly.  She was also not able to tolerate 5,000 units daily.  Weaned down to 2,000 units at which point the side effects disappeared.     Review of Systems For ROS see HPI     Objective:   Physical Exam  Constitutional: She is oriented to person, place, and time. She appears well-developed and well-nourished. No distress.  HENT:  Head: Normocephalic and atraumatic.  Eyes: Conjunctivae and EOM are normal. Pupils are equal, round, and reactive to light.  Neck: Normal range of motion. Neck supple. No thyromegaly present.  Cardiovascular: Normal rate, regular rhythm, normal heart sounds and intact distal pulses.  No murmur heard. Pulmonary/Chest: Effort normal and breath sounds normal. No respiratory distress.  Abdominal: Soft. She exhibits no distension. There is no tenderness.  Musculoskeletal: She exhibits no edema.  Lymphadenopathy:    She has no cervical adenopathy.  Neurological: She is alert and oriented to person, place, and time.  Skin: Skin is warm and dry.  Psychiatric: She has a normal mood and affect. Her behavior is normal.  Vitals reviewed.         Assessment & Plan:  Fatigue- improved since stopping Fenofibrate.  Since pt is feeling better, no changes at this time.  Will follow.

## 2017-07-21 ENCOUNTER — Telehealth: Payer: Self-pay | Admitting: Hematology and Oncology

## 2017-07-21 NOTE — Progress Notes (Signed)
Location of Breast Cancer: Right Breast  Histology per Pathology Report:  07/10/17 Diagnosis Breast, right, needle core biopsy, upper outer quadrant - INVASIVE MAMMARY CARCINOMA, SEE COMMENT. - MAMMARY CARCINOMA IN SITU WITH CALCIFICATIONS.  Receptor Status: ER(95%), PR (2%), Her2-neu (NEG), Ki-(5%)  Did patient present with symptoms or was this found on screening mammography?: It was found on a screening mammogram.   Past/Anticipated interventions by surgeon, if any: Dr. Marlou Starks. He plans a lumpectomy if she is able to have more radiation, if she is not she will proceed with a Right Mastectomy.   Past/Anticipated interventions by medical oncology, if any: 07/29/17 Dr. Lindi Adie  Lymphedema issues, if any:  N/A  Pain issues, if any:  She denies  SAFETY ISSUES:  Prior radiation? Yes, Partial Right Breast radiation 2012, partial Left Breast radiation 2007. She reports to me that both previous radiation treatments were mammosite.   Pacemaker/ICD? No  Possible current pregnancy? N/A  Is the patient on methotrexate? 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 She then developed  And area of LCIS in the Right Breast in 2017 that was removed as well.  Left Breast with history of lumpectomy in 2007 with sentinel node mapping and partial Left Breast radiation. Tamoxifen started in 2007 for 5 years.  She tells me that both her radiation treatments were mammosite by Dr. Tammi Klippel in 2007 and 2012  BP 134/76   Pulse 81   Temp 97.7 F (36.5 C)   Ht '5\' 3"'$  (1.6 m)   Wt 162 lb (73.5 kg)   SpO2 100% Comment: room air  BMI 28.70 kg/m    Wt Readings from Last 3 Encounters:  07/22/17 162 lb (73.5 kg)  07/17/17 160 lb 6 oz (72.7 kg)  06/11/17 155 lb 6 oz (70.5 kg)      Clerance Umland, Stephani Police, RN 07/21/2017,10:52 AM

## 2017-07-21 NOTE — Telephone Encounter (Signed)
Spoke with patient regarding appointment D/T/Loc/Ph# °

## 2017-07-22 ENCOUNTER — Encounter: Payer: Self-pay | Admitting: Genetic Counselor

## 2017-07-22 ENCOUNTER — Encounter: Payer: Self-pay | Admitting: Radiation Oncology

## 2017-07-22 ENCOUNTER — Inpatient Hospital Stay: Payer: Medicare Other | Attending: Genetic Counselor

## 2017-07-22 ENCOUNTER — Other Ambulatory Visit: Payer: Self-pay | Admitting: General Surgery

## 2017-07-22 ENCOUNTER — Ambulatory Visit
Admission: RE | Admit: 2017-07-22 | Discharge: 2017-07-22 | Disposition: A | Discharge status: Home / Self Care | Payer: Medicare Other | Source: Ambulatory Visit | Attending: Radiation Oncology | Admitting: Radiation Oncology

## 2017-07-22 ENCOUNTER — Other Ambulatory Visit: Payer: Self-pay | Admitting: Family Medicine

## 2017-07-22 ENCOUNTER — Ambulatory Visit (HOSPITAL_BASED_OUTPATIENT_CLINIC_OR_DEPARTMENT_OTHER): Payer: Medicare Other | Admitting: Genetic Counselor

## 2017-07-22 DIAGNOSIS — Z170 Estrogen receptor positive status [ER+]: Principal | ICD-10-CM

## 2017-07-22 DIAGNOSIS — C50911 Malignant neoplasm of unspecified site of right female breast: Secondary | ICD-10-CM

## 2017-07-22 DIAGNOSIS — Z803 Family history of malignant neoplasm of breast: Secondary | ICD-10-CM | POA: Insufficient documentation

## 2017-07-22 DIAGNOSIS — Z808 Family history of malignant neoplasm of other organs or systems: Secondary | ICD-10-CM

## 2017-07-22 DIAGNOSIS — Z800 Family history of malignant neoplasm of digestive organs: Secondary | ICD-10-CM | POA: Insufficient documentation

## 2017-07-22 DIAGNOSIS — Z9889 Other specified postprocedural states: Secondary | ICD-10-CM | POA: Insufficient documentation

## 2017-07-22 DIAGNOSIS — Z8371 Family history of colonic polyps: Secondary | ICD-10-CM | POA: Diagnosis not present

## 2017-07-22 DIAGNOSIS — Z96653 Presence of artificial knee joint, bilateral: Secondary | ICD-10-CM | POA: Diagnosis not present

## 2017-07-22 DIAGNOSIS — C50411 Malignant neoplasm of upper-outer quadrant of right female breast: Secondary | ICD-10-CM | POA: Insufficient documentation

## 2017-07-22 DIAGNOSIS — Z853 Personal history of malignant neoplasm of breast: Secondary | ICD-10-CM | POA: Diagnosis not present

## 2017-07-22 DIAGNOSIS — Z923 Personal history of irradiation: Secondary | ICD-10-CM | POA: Insufficient documentation

## 2017-07-22 DIAGNOSIS — Z86000 Personal history of in-situ neoplasm of breast: Secondary | ICD-10-CM | POA: Diagnosis not present

## 2017-07-22 DIAGNOSIS — D0501 Lobular carcinoma in situ of right breast: Secondary | ICD-10-CM

## 2017-07-22 DIAGNOSIS — Z315 Encounter for genetic counseling: Secondary | ICD-10-CM

## 2017-07-22 DIAGNOSIS — Z79899 Other long term (current) drug therapy: Secondary | ICD-10-CM | POA: Diagnosis not present

## 2017-07-22 NOTE — Progress Notes (Signed)
REFERRING PROVIDER: Nicholas Lose, MD [place], [place] [address]  PRIMARY PROVIDER:  Midge Minium, MD  PRIMARY REASON FOR VISIT:  1. Malignant neoplasm of right breast in female, estrogen receptor positive, unspecified site of breast ([place])   2. Family history of breast cancer   3. Family history of pancreatic cancer   4. Lobular carcinoma in situ (LCIS) of right breast   5. Hx of Breast cancer ([facility]), Left,Stage I Receptor +, Her2 -      HISTORY OF PRESENT ILLNESS:   Jacqueline [name], a 83 y.o. female, was seen for a [facility] cancer genetics consultation at the request of Dr. Lindi Adie due to a personal and family history of cancer.  Jacqueline [name] presents to clinic today to discuss the possibility of a hereditary predisposition to cancer, genetic testing, and to further clarify her future cancer risks, as well as potential cancer risks for family members.   In 2007, at the age of 64, Jacqueline [name] was diagnosed with invasive ductal carcinoma of the left breast. This was treated with lumpectomy and radiation. She was diagnosed again  In 2012 with invasive ductal carcinoma of the right breast in 2012.  In 2017, a lumpectomy found LCIS, and in 2019 she was diagnosed with lobular breast cancer in the right breast.  She has not had any other cancer than breast cancer.     CANCER HISTORY:   No history exists.     HORMONAL RISK FACTORS:  Menarche was at age 24.  First live birth at age 62.  OCP use for approximately 0 years.  Ovaries intact: yes.  Hysterectomy: no.  Menopausal status: postmenopausal.  HRT use: 0 years. Colonoscopy: yes; 1-2 polyps. Mammogram within the last year: yes. Number of breast biopsies: 5. Up to date with pelvic exams:  n/a. Any excessive radiation exposure in the past:  no  Past Medical History:  Diagnosis Date  . Breast abscess 07/30/2011   Developed in the mammosite cavity and treated with I&D, packed and healed   . Breast  cancer ([place]) hx 2007   stage T1b grade 2 ER/positive,ductal ca left breast  . Breast lump    rt  . Cancer ([place]) 2007, 2012   breast- bilateral  . Complication of anesthesia   . Family history of breast cancer   . Family history of pancreatic cancer   . GERD (gastroesophageal reflux disease)   . Glaucoma   . History of bilateral knee replacement    left approx. 2007, right approx. 2002  . Hyperlipidemia   . Hypertension   . Kidney stones   . Osteopenia   . Personal history of chemotherapy   . Personal history of radiation therapy   . PONV (postoperative nausea and vomiting)   . Ruptured disc, cervical   . Sleep initiation dysfunction   . Torn rotator cuff 08/2012    Past Surgical History:  Procedure Laterality Date  . BREAST LUMPECTOMY WITH RADIOACTIVE SEED LOCALIZATION Right 06/14/2016   Procedure: RIGHT BREAST LUMPECTOMY WITH RADIOACTIVE SEED LOCALIZATION;  Surgeon: Autumn Messing III, MD;  Location: [facility];  Service: General;  Laterality: Right;  . CATARACT EXTRACTION    . CHOLECYSTECTOMY    . DILATION AND CURETTAGE OF UTERUS    . INCISE AND DRAIN ABCESS     breast   . JOINT REPLACEMENT    . KIDNEY STONE SURGERY    . MASTECTOMY PARTIAL / LUMPECTOMY  2007   Left - Dr Margot Chimes  .  MASTECTOMY PARTIAL / LUMPECTOMY  2012 and 2017   Right Dr Streck 2012, Dr. Toth 2017  . REPLACEMENT TOTAL KNEE BILATERAL     right approx. 2012, left approx 2007    Social History   Socioeconomic History  . Marital status: Married    Spouse name: Not on file  . Number of children: Not on file  . Years of education: Not on file  . Highest education level: Not on file  Social Needs  . Financial resource strain: Not on file  . Food insecurity - worry: Not on file  . Food insecurity - inability: Not on file  . Transportation needs - medical: Not on file  . Transportation needs - non-medical: Not on file  Occupational History  . Not on file  Tobacco Use  . Smoking status:  Never Smoker  . Smokeless tobacco: Never Used  Substance and Sexual Activity  . Alcohol use: No    Alcohol/week: 0.0 oz  . Drug use: No  . Sexual activity: Yes    Birth control/protection: Post-menopausal  Other Topics Concern  . Not on file  Social History Narrative  . Not on file     FAMILY HISTORY:  We obtained a detailed, 4-generation family history.  Significant diagnoses are listed below: Family History  Problem Relation Age of Onset  . Hypertension Mother   . Colon polyps Mother   . Stroke Father   . Cancer Brother        throat  . Pancreatic cancer Other        dx in her 60s  . Breast cancer Cousin        paternal cousin died in her 30s  . Colon cancer Neg Hx   . Stomach cancer Neg Hx    The patient has a son and daughter who are cancer free.  She had three sisters and two brothers.  One brother had throat cancer, and one sister has a daughter with pancreatic cancer.  Both parents are deceased.  The patient's mother had colon polyps, but died at 92.  She has several siblings, but the patient is not aware of any with cancer.  The grandparents died of non cancer related issues.  The patient's father died of a stroke.  He had three sisters and a brother who were cancer free but one sister had a daughter who died of breast cancer in her 30's.  Both grandparents are deceased.  Jacqueline [name] is unaware of previous family history of genetic testing for hereditary cancer risks. Patient's ancestors are of [place] descent.. There is no reported Ashkenazi Jewish ancestry. There is no known consanguinity.  GENETIC COUNSELING ASSESSMENT: Jacqueline [name] is a 82 y.o. female with a personal and family history of cancer which is somewhat suggestive of a hereditary cancer syndrome and predisposition to cancer. We, therefore, discussed and recommended the following at today's visit.   DISCUSSION: We discussed that about 5-10% of breast cancer is hereditary with most cases due to  BRCA mutations.  There are other genes associated with hereditary breast cancer syndromes, including ATM, CHEK2 and PALB2.  We disucssed that while she meets criteria based on her 3 diagnosis of breast cancer and family history, that her history is not typically consistent with a high risk BRCA mutation, but possibly a moderate risk gene such as ATM or PALB2.  We reviewed the characteristics, features and inheritance patterns of hereditary cancer syndromes. We also discussed genetic testing, including the appropriate family members to   test, the process of testing, insurance coverage and turn-around-time for results. We discussed the implications of a negative, positive and/or variant of uncertain significant result. In order to get genetic test results in a timely manner so that Jacqueline [name] can use these genetic test results for surgical decisions, we recommended Jacqueline [name] pursue genetic testing for the STAT panel. If this test is negative, we then recommend Jacqueline [name] pursue reflex genetic testing to the common hereditary cancer gene panel. The hereditary cancer panel contains the genes within the STAT panel.  The Hereditary Gene Panel offered by [facility] includes sequencing and/or deletion duplication testing of the following 47 genes: APC, ATM, AXIN2, BARD1, BMPR1A, BRCA1, BRCA2, BRIP1, CDH1, CDK4, CDKN2A (p14ARF), CDKN2A (p16INK4a), CHEK2, CTNNA1, DICER1, EPCAM (Deletion/duplication testing only), GREM1 (promoter region deletion/duplication testing only), KIT, MEN1, MLH1, MSH2, MSH3, MSH6, MUTYH, NBN, NF1, NHTL1, PALB2, PDGFRA, PMS2, POLD1, POLE, PTEN, RAD50, RAD51C, RAD51D, SDHB, SDHC, SDHD, SMAD4, SMARCA4. STK11, TP53, TSC1, TSC2, and VHL.  The following genes were evaluated for sequence changes only: SDHA and HOXB13 c.251G>A variant only.   Based on Ms. Fosberg's personal and family history of cancer, she meets medical criteria for genetic testing. Despite that she meets criteria, she may still have  an out of pocket cost. We discussed that if her out of pocket cost for testing is over $100, the laboratory will call and confirm whether she wants to proceed with testing.  If the out of pocket cost of testing is less than $100 she will be billed by the genetic testing laboratory.   PLAN: After considering the risks, benefits, and limitations, Jacqueline [name]  provided informed consent to pursue genetic testing and the blood sample was sent to [facility] for analysis of the STAT panel with a possible reflex to the common hereditary cancer panel. Results should be available within approximately 7-10 days' time, at which point they will be disclosed by telephone to Jacqueline [name], as will any additional recommendations warranted by these results. Jacqueline [name] will receive a summary of her genetic counseling visit and a copy of her results once available. This information will also be available in Epic. We encouraged Jacqueline [name] to remain in contact with cancer genetics annually so that we can continuously update the family history and inform her of any changes in cancer genetics and testing that may be of benefit for her family. Jacqueline [name]'s questions were answered to her satisfaction today. Our contact information was provided should additional questions or concerns arise.  Lastly, we encouraged Jacqueline [name] to remain in contact with cancer genetics annually so that we can continuously update the family history and inform her of any changes in cancer genetics and testing that may be of benefit for this family.   Ms.  [name]'s questions were answered to her satisfaction today. Our contact information was provided should additional questions or concerns arise. Thank you for the referral and allowing [place] to share in the care of your patient.   Timmi Devora P. Florene Glen, [place], [facility] Certified Genetic Counselor [email or web address] phone: [phone]  The patient was seen for a total of 60 minutes in  face-to-face genetic counseling.  This patient was discussed with Drs. Magrinat, Lindi Adie and/or Burr Medico who agrees with the above.    _______________________________________________________________________ For Office Staff:  Number of people involved in session: 1 Was an Intern/ student involved with case: no

## 2017-07-22 NOTE — Progress Notes (Addendum)
Radiation Oncology         [phone] ________________________________  Name: Jacqueline [name] MRN: [id number]  Date: 07/22/2017  DOB: [birth year]  OI:[id number], Jacqueline Millet, MD  Jovita Kussmaul, MD     REFERRING PHYSICIAN: Jovita Kussmaul, MD    Attestation Please see the note from Shona Simpson, PA-C from today's visit for more details of today's encounter.  I have personally performed a face to face diagnostic evaluation on this patient and devised the following assessment and plan.  The patient returns to clinic today for follow-up for her diagnosis of breast cancer. The patient has completed a lumpectomy and will not receive chemotherapy. The patient is appropriate to proceed with adjuvant radiation treatment at this time. We discussed proceeding with a course of radiation treatment to the right breast using tangent whole breast radiation treatment fields for 6 1/2 weeks. All of her questions were answered and she wishes to proceed with treatment. The patient will proceed with simulation for treatment planning.  Staging - [id number] right breast  Kyung Rudd, MD    DIAGNOSIS: The encounter diagnosis was Malignant neoplasm of right breast in female, estrogen receptor positive, unspecified site of breast ([place]).   HISTORY OF PRESENT ILLNESS:Jacqueline [name] is a 83 y.o. female who is seen for an initial consultation visit regarding a new diagnosis of right breast cancer. She presents today with right breast cancer.The pt has a hx of a lumpectomy of the left breast with sentinel node mapping and partial left breast radiation in 2007 for a Stage IA, pT1bN0 grade 2 ER/PR positive Invasive Ductal Carcinoma. She also started tamoxifen in 2007 for 5 years. In 2012, the pt had an excision of the right breast followed by partial breast radiation for DCIS.The pt underwent a right breast  lumpectomy in 2017 for LCIS. Diagnostic bilateral mammogram on 07/03/2017 revealed a suspicious 7 mm group of  calcifications present adjacent to the coil shaped biopsy marking clip and no mammographic evidence of malignancy in the left breast. On 07/10/2017,a biopsy of this site revealed an invasive mammary carcinoma consistent with a grade 1, lobular phenotype, ER/PR positive, HER2 negative Ki 67 was 5%. She also underwent an ultrasound of the right breast on 07/15/2017 that revealed no adenopathy in the right axilla. She comes today to discuss if she has additional radiotherapy options if she has breast conservation, or if she should proceed with mastectomy.   PREVIOUS RADIATION THERAPY: Yes , previously under the care of Dr. Tammi Klippel  2012: partial right breast radiation   2007: partial left breast radiation   Past Medical History:  Past Medical History:  Diagnosis Date  . Breast abscess 07/30/2011   Developed in the mammosite cavity and treated with I&D, packed and healed   . Breast cancer ([place]) hx 2007   stage T1b grade 2 ER/positive,ductal ca left breast  . Breast lump    rt  . Cancer ([place]) 2007, 2012   breast- bilateral  . Complication of anesthesia   . Family history of breast cancer   . Family history of pancreatic cancer   . GERD (gastroesophageal reflux disease)   . Glaucoma   . History of bilateral knee replacement    left approx. 2007, right approx. 2002  . Hyperlipidemia   . Hypertension   . Kidney stones   . Osteopenia   . Personal history of chemotherapy   . Personal history of radiation therapy   . PONV (postoperative nausea and vomiting)   .  Ruptured disc, cervical   . Sleep initiation dysfunction   . Torn rotator cuff 08/2012    Past Surgical History: Past Surgical History:  Procedure Laterality Date  . BREAST LUMPECTOMY WITH RADIOACTIVE SEED LOCALIZATION Right 06/14/2016   Procedure: RIGHT BREAST LUMPECTOMY WITH RADIOACTIVE SEED LOCALIZATION;  Surgeon: Autumn Messing III, MD;  Location: [facility];  Service: General;  Laterality: Right;  . CATARACT  EXTRACTION    . CHOLECYSTECTOMY    . DILATION AND CURETTAGE OF UTERUS    . INCISE AND DRAIN ABCESS     breast   . JOINT REPLACEMENT    . KIDNEY STONE SURGERY    . MASTECTOMY PARTIAL / LUMPECTOMY  2007   Left - Dr Margot Chimes  . MASTECTOMY PARTIAL / LUMPECTOMY  2012 and 2017   Right Dr Margot Chimes 2012, Dr. Marlou Starks 2017  . REPLACEMENT TOTAL KNEE BILATERAL     right approx. 2012, left approx 2007    Social History:  Social History   Socioeconomic History  . Marital status: Married    Spouse name: Not on file  . Number of children: Not on file  . Years of education: Not on file  . Highest education level: Not on file  Social Needs  . Financial resource strain: Not on file  . Food insecurity - worry: Not on file  . Food insecurity - inability: Not on file  . Transportation needs - medical: Not on file  . Transportation needs - non-medical: Not on file  Occupational History  . Not on file  Tobacco Use  . Smoking status: Never Smoker  . Smokeless tobacco: Never Used  Substance and Sexual Activity  . Alcohol use: No    Alcohol/week: 0.0 oz  . Drug use: No  . Sexual activity: Yes    Birth control/protection: Post-menopausal  Other Topics Concern  . Not on file  Social History Narrative  . Not on file  The patient is married and lives in [place]. She has an adult daughter.  Family History: Family History  Problem Relation Age of Onset  . Hypertension Mother   . Colon polyps Mother   . Stroke Father   . Cancer Brother        throat  . Pancreatic cancer Other        dx in her 78s  . Breast cancer Cousin        paternal cousin died in her 52s  . Colon cancer Neg Hx   . Stomach cancer Neg Hx     ALLERGIES: Aspirin and Codeine sulfate   MEDICATIONS:  Current Outpatient Medications  Medication Sig Dispense Refill  . AMBULATORY NON FORMULARY MEDICATION Take 1 capsule by mouth daily. Medication Name: Tumeric    . amLODipine (NORVASC) 5 MG tablet TAKE 1 TABLET BY MOUTH  EVERY DAY 30 tablet 5  . Cholecalciferol (VITAMIN D3) 2000 UNITS TABS Take 2,000 Units by mouth daily.     . cromolyn (NASALCROM) 5.2 MG/ACT nasal spray Place 1 spray into both nostrils 4 (four) times daily.    . Cyanocobalamin (VITAMIN B 12 PO) Take 1 capsule by mouth.    . diclofenac sodium (VOLTAREN) 1 % GEL Reported on 11/28/2015  1  . loperamide (IMODIUM) 2 MG capsule Take 2 mg by mouth as needed for diarrhea or loose stools.    . mirtazapine (REMERON) 15 MG tablet Take 1 tablet (15 mg total) at bedtime by mouth. 90 tablet 1  . nystatin ointment (MYCOSTATIN) Apply 1 application  topically 2 (two) times daily.    Marland Kitchen olmesartan (BENICAR) 40 MG tablet Take 1 tablet (40 mg total) by mouth daily. 30 tablet 6  . omeprazole (PRILOSEC OTC) 20 MG tablet Take 1 tablet (20 mg total) by mouth daily. 90 tablet 4  . QUEtiapine (SEROQUEL) 25 MG tablet TAKE 1 TABLET (25 MG TOTAL) BY MOUTH AT BEDTIME. 90 tablet 0  . TAZORAC 0.05 % cream APPLY TO AFFECTED AREA IN THE EVENING  1  . triamcinolone ointment (KENALOG) 0.1 % Apply 1 application topically 2 (two) times daily.     No current facility-administered medications for this encounter.      REVIEW OF SYSTEMS:  On review of systems, the patient reports that she is doing well overall. She denies any chest pain, shortness of breath, cough, fevers, chills, night sweats, unintended weight changes. She denies any bowel or bladder disturbances, and denies abdominal pain, nausea or vomiting. She denies any new musculoskeletal or joint aches or pains, new skin lesions or concerns. A complete review of systems is obtained and is otherwise negative.     PHYSICAL EXAM:  Wt Readings from Last 3 Encounters:  07/22/17 162 lb (73.5 kg)  07/17/17 160 lb 6 oz (72.7 kg)  06/11/17 155 lb 6 oz (70.5 kg)   Temp Readings from Last 3 Encounters:  07/22/17 97.7 F (36.5 C)  07/17/17 97.9 F (36.6 C) (Oral)  06/11/17 98.1 F (36.7 C) (Oral)   BP Readings from Last 3  Encounters:  07/22/17 134/76  07/17/17 122/82  06/11/17 121/81   Pulse Readings from Last 3 Encounters:  07/22/17 81  07/17/17 78  06/11/17 88    In general this is a well appearing Caucasian female in no acute distress. She is alert and oriented x4 and appropriate throughout the examination. HEENT reveals that the patient is normocephalic, atraumatic. EOMs are intact. PERRLA. Skin is intact without any evidence of gross lesions. Cardiopulmonary assessment is negative for acute distress and she exhibits normal effort. Exam is deferred of the breasts.    ECOG = 0  0 - Asymptomatic (Fully active, able to carry on all predisease activities without restriction)  1 - Symptomatic but completely ambulatory (Restricted in physically strenuous activity but ambulatory and able to carry out work of a light or sedentary nature. For example, light housework, office work)  2 - Symptomatic, <50% in bed during the day (Ambulatory and capable of all self care but unable to carry out any work activities. Up and about more than 50% of waking hours)  3 - Symptomatic, >50% in bed, but not bedbound (Capable of only limited self-care, confined to bed or chair 50% or more of waking hours)  4 - Bedbound (Completely disabled. Cannot carry on any self-care. Totally confined to bed or chair)  5 - Death   Eustace Pen MM, Creech RH, Tormey DC, et al. [phone]). "Toxicity and response criteria of the [facility]". [place] Oncol. 5 (6): 649-55     LABORATORY DATA:  Lab Results  Component Value Date   WBC 8.8 06/03/2017   HGB 12.9 06/03/2017   HCT 39.1 06/03/2017   MCV 94.7 06/03/2017   PLT 204.0 06/03/2017   Lab Results  Component Value Date   NA 142 07/07/2017   K 3.9 07/07/2017   CL 108 07/07/2017   CO2 27 07/07/2017   Lab Results  Component Value Date   ALT 27 06/03/2017   AST 22 06/03/2017   ALKPHOS 57 06/03/2017  BILITOT 0.6 06/03/2017      RADIOGRAPHY: Mm Diag  Breast Tomo Bilateral  Result Date: 07/03/2017 CLINICAL DATA:  83 year old female with history of right breast LCIS post lumpectomy 06/14/2016. At the time of lumpectomy the coil shaped biopsy marking clip was not identified within the surgical specimen. EXAM: 2D DIGITAL DIAGNOSTIC BILATERAL MAMMOGRAM WITH CAD AND ADJUNCT TOMO COMPARISON:  Previous exam(s). ACR Breast Density Category b: There are scattered areas of fibroglandular density. FINDINGS: Postsurgical changes are present in the upper central to slightly outer right breast related to interval lumpectomy. The coil shaped biopsy marking clip is present just lateral to the lumpectomy site with an adjacent suspicious 7 mm group of microcalcifications, some of which are amorphous. No suspicious masses or calcifications seen in the left breast. Stable lumpectomy changes in the upper-outer left breast. Spot compression magnification CC view of the left breast lumpectomy site was performed with no mammographic evidence of locally recurrent malignancy. Mammographic images were processed with CAD. IMPRESSION: 1. New right breast lumpectomy changes with a suspicious 7 mm group of calcifications present adjacent to the coil shaped biopsy marking clip. 2.  No mammographic evidence of malignancy in the left breast. RECOMMENDATION: The findings were discussed with the patient's surgeon, Dr. Marlou Starks, 07/03/2016 at 11 a.m. Stereotactic guided biopsy of the suspicious calcifications in the upper-outer right breast adjacent to the coil shaped biopsy marking clip will be performed. This is scheduled for 07/09/2017 at 11:30 a.m. I have discussed the findings and recommendations with the patient. Results were also provided in writing at the conclusion of the visit. If applicable, a reminder letter will be sent to the patient regarding the next appointment. BI-RADS CATEGORY  4: Suspicious. Electronically Signed   By: Everlean Alstrom M.D.   On: 07/03/2017 12:55   [place] Axilla  Right  Result Date: 07/15/2017 CLINICAL DATA:  History of right lumpectomy in 2009, left lumpectomy in 2007. Most recently stereotactic guided core biopsy of right breast calcification shows grade 1 invasive mammary carcinoma with mammary carcinoma in situ in the upper-outer quadrant. Patient returns for evaluation of the right axilla. EXAM: ULTRASOUND OF THE RIGHT BREAST COMPARISON:  07/10/2017 and earlier FINDINGS: On physical exam, I palpate no abnormality in the right axilla. Targeted ultrasound is performed, showing normal appearing right axillary lymph nodes. No suspicious lymph nodes are identified. IMPRESSION: No adenopathy in the right axilla. RECOMMENDATION: Treatment plan for known right breast cancer. I have discussed the findings and recommendations with the patient. Results were also provided in writing at the conclusion of the visit. If applicable, a reminder letter will be sent to the patient regarding the next appointment. BI-RADS CATEGORY  1: Negative. Electronically Signed   By: Nolon Nations M.D.   On: 07/15/2017 10:38   Mm Clip Placement Right  Result Date: 07/10/2017 CLINICAL DATA:  Post biopsy mammogram of the right breast for clip placement. EXAM: DIAGNOSTIC RIGHT MAMMOGRAM POST ULTRASOUND BIOPSY COMPARISON:  Previous exam(s). FINDINGS: Mammographic images were obtained following stereotactic guided biopsy of calcifications in the upper outer quadrant of the right breast. The X shaped biopsy marking clip is well positioned and the site of biopsied calcifications in the upper-outer quadrant of the right breast. The coil shaped biopsy marking clip from the biopsy prior to surgical excision is seen 1 cm medial to the X shaped clip placed today. IMPRESSION: The X shaped biopsy marking clip at the site of today's biopsy is well positioned in the upper outer quadrant. It is 1 cm  lateral to the coil shaped biopsy marking clip already present in the right breast. Final Assessment: Post  Procedure Mammograms for Marker Placement Electronically Signed   By: Ammie Ferrier M.D.   On: 07/10/2017 11:35   Mm Rt Breast Bx W Loc Dev 1st Lesion Image Bx Spec Stereo Guide  Addendum Date: 07/11/2017   ADDENDUM REPORT: 07/11/2017 12:43 ADDENDUM: Pathology revealed GRADE I INVASIVE MAMMARY CARCINOMA, MAMMARY CARCINOMA IN SITU WITH CALCIFICATIONS of the Right breast, upper outer quadrant. This was found to be concordant by Dr. Ammie Ferrier. Pathology results were discussed with the patient by telephone. The patient reported doing well after the biopsy with tenderness at the site. Post biopsy instructions and care were reviewed and questions were answered. The patient was encouraged to call [facility] for any additional concerns. Surgical consultation has been arranged with Dr. Autumn Messing, per patient request, at [facility] on July 17, 2017. The patient is scheduled for a Right axillary ultrasound on July 15, 2017 for evaluation of the lymph nodes due to the invasive diagnosis. Pathology results reported by Terie Purser, RN on 07/11/2017. Electronically Signed   By: Ammie Ferrier M.D.   On: 07/11/2017 12:43   Result Date: 07/11/2017 CLINICAL DATA:  83 year old female presenting for stereotactic biopsy of right breast calcifications. EXAM: RIGHT BREAST STEREOTACTIC CORE NEEDLE BIOPSY COMPARISON:  Previous exams. FINDINGS: The patient and I discussed the procedure of stereotactic-guided biopsy including benefits and alternatives. We discussed the high likelihood of a successful procedure. We discussed the risks of the procedure including infection, bleeding, tissue injury, clip migration, and inadequate sampling. Informed written consent was given. The usual time out protocol was performed immediately prior to the procedure. Using sterile technique and 1% Lidocaine as local anesthetic, under stereotactic guidance, a 9 gauge vacuum assisted device was  used to perform core needle biopsy of calcifications in the upper-outer quadrant of the right breast using a superior approach. Specimen radiograph was performed showing calcifications within multiple core samples. Specimens with calcifications are identified for pathology. Lesion quadrant: Upper outer quadrant At the conclusion of the procedure, a X shaped tissue marker clip was deployed into the biopsy cavity. Follow-up 2-view mammogram was performed and dictated separately. IMPRESSION: Stereotactic-guided biopsy of calcifications in the upper outer quadrant of the right breast. No apparent complications. Electronically Signed: By: Ammie Ferrier M.D. On: 07/10/2017 11:31       IMPRESSION/PLAN 1. Stage IA, cT1bN0M0 ER/PR positive invasive lobular carcinoma of the right breast in the setting of prior LCIS and DCIS of the right breast, and prior Stage IA, pT1bN0M0 ER/PR positive invasive ductal carcinoma of the left breast. Dr. Lisbeth Renshaw discusses the pathology findings and reviews the nature of second primaries in terms of breast disease. The patient has the options to proceed with lumpectomy and external beam radiotherapy to the breast over 6 1/2 weeks versus mastectomy, versus lumpectomy along with adjuvant antiestrogen therapy. She is considering her options. We discussed the risks, benefits, short, and long term effects of radiotherapy. We will follow up with her once she's met with Dr. Lindi Adie next week and considered her surgical options with Dr. Marlou Starks.    We spent 60 minutes face to face with the patient and more than 50% of that time was spent in counseling and/or coordination of care.   The above documentation reflects my direct findings during this shared patient visit. Please see the separate note by Dr. Lisbeth Renshaw on this date for the remainder  of the patient's plan of care.     Carola Rhine, PAC     This document serves as a record of services personally performed by Shona Simpson,  PA-C and Kyung Rudd, MD. It was created on their behalf by Valeta Harms, a trained medical scribe. The creation of this record is based on the scribe's personal observations and the providers' statements to them. This document has been checked and approved by the attending provider.

## 2017-07-23 NOTE — Addendum Note (Signed)
Encounter addended by: Kyung Rudd, MD on: 07/23/2017 12:31 PM  Actions taken: Sign clinical note

## 2017-07-28 ENCOUNTER — Ambulatory Visit: Payer: Medicare Other | Admitting: Hematology and Oncology

## 2017-07-29 ENCOUNTER — Telehealth: Payer: Self-pay | Admitting: Genetic Counselor

## 2017-07-29 ENCOUNTER — Encounter: Payer: Self-pay | Admitting: Genetic Counselor

## 2017-07-29 ENCOUNTER — Inpatient Hospital Stay: Payer: Medicare Other | Admitting: Hematology and Oncology

## 2017-07-29 DIAGNOSIS — Z170 Estrogen receptor positive status [ER+]: Secondary | ICD-10-CM | POA: Diagnosis not present

## 2017-07-29 DIAGNOSIS — C50411 Malignant neoplasm of upper-outer quadrant of right female breast: Secondary | ICD-10-CM

## 2017-07-29 DIAGNOSIS — Z86000 Personal history of in-situ neoplasm of breast: Secondary | ICD-10-CM

## 2017-07-29 DIAGNOSIS — Z1379 Encounter for other screening for genetic and chromosomal anomalies: Secondary | ICD-10-CM | POA: Insufficient documentation

## 2017-07-29 NOTE — Assessment & Plan Note (Signed)
07/10/2017:7 mm right breast calcifications, stereotactic biopsy revealed invasive lobular cancer with LCIS ER 95%, PR 2%, Ki-67 5%, HER-2 negative ratio 1.31, T1 BN 0 stage I a clinical stage (Right lumpectomy 06/14/2016: LCIS with necrosis 3 cm, broadly involves the posterior margin, additional deep margin LCIS with necrosis 1.5 cm focally 0.1 cm from posterior margin.)  Pathology and radiology counseling:Discussed with the patient, the details of pathology including the type of breast cancer,the clinical staging, the significance of ER, PR and HER-2/neu receptors and the implications for treatment. After reviewing the pathology in detail, we proceeded to discuss the different treatment options between surgery, radiation, chemotherapy, antiestrogen therapies.  Recommendations: 1. Breast conserving surgery followed by 2. Adjuvant radiation therapy followed by 3. Adjuvant antiestrogen therapy  Return to clinic after surgery to discuss final pathology report

## 2017-07-29 NOTE — Telephone Encounter (Signed)
Revealed negative genetic testing on STAT.  Discussed that we do not know why she has breast cancer or why there is cancer in the family. It could be due to a different gene that we are not testing, or maybe our current technology may not be able to pick something up.  It will be important for her to keep in contact with genetics to keep up with whether additional testing may be needed. We will reflex to a larger panel.

## 2017-07-29 NOTE — Telephone Encounter (Signed)
LM on VM with good news on test results.

## 2017-07-29 NOTE — Progress Notes (Signed)
Patient Care Team: Midge Minium, MD as PCP - General (Family Medicine) Luberta Mutter, MD as Consulting Physician (Ophthalmology) Kristeen Miss, MD as Consulting Physician (Neurosurgery) Elsie Saas, MD as Consulting Physician (Orthopedic Surgery) Jari Pigg, MD as Consulting Physician (Dermatology)  DIAGNOSIS:  Encounter Diagnosis  Name Primary?  . 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  Did not take antiestrogen therapy        07/10/2017 Initial Diagnosis    7 mm right breast calcifications, stereotactic biopsy revealed invasive lobular cancer with LCIS ER 95%, PR 2%, Ki-67 5%, HER-2 negative ratio 1.31, T1 BN 0 stage I a clinical stage       CHIEF COMPLIANT: Newly diagnosed right breast cancer  INTERVAL HISTORY: Jacqueline [name] is a 83 year old lady who looks much younger than her stated age with above-mentioned history of recurrent breast problems.  Recently she was diagnosed with a right breast cancer.  She had a mammogram that showed right breast calcifications.  Stereotactic biopsy came back as invasive lobular cancer with LCIS that was ER 95% PR 2%, HER-2 negative ratio 1.31.  She was seen by Dr. Marlou Starks and was referred to [place] for discussion regarding treatment options.  Previously she had bilateral lumpectomies and  bilateral radiations.  She met with radiation oncology who felt that she could once again get radiation but she is planning on doing a mastectomy.  REVIEW OF SYSTEMS:   Constitutional: Denies fevers, chills or abnormal weight loss Eyes: Denies blurriness of vision Ears, nose, mouth, throat, and face: Denies mucositis or sore throat Respiratory: Denies cough, dyspnea or wheezes Cardiovascular: Denies palpitation, chest discomfort Gastrointestinal:  Denies nausea, heartburn or change in bowel habits Skin: Denies abnormal skin rashes Lymphatics: Denies new lymphadenopathy or easy bruising Neurological:Denies numbness, tingling or new weaknesses Behavioral/Psych: Mood is stable, no new changes  Extremities: No lower extremity edema Breast:  denies any pain or lumps or nodules in either breasts All other systems were reviewed with the patient and are negative.  I have reviewed the past medical history, past surgical history, social history and family history with the patient and they are unchanged from previous note.  ALLERGIES:  is allergic to aspirin and codeine sulfate.  MEDICATIONS:  Current Outpatient Medications  Medication Sig Dispense Refill  . AMBULATORY NON FORMULARY MEDICATION Take 1 capsule by mouth daily. Medication Name: Tumeric    . amLODipine (NORVASC) 5 MG tablet TAKE 1 TABLET BY MOUTH EVERY DAY 30 tablet 5  . Cholecalciferol (VITAMIN D3) 2000 UNITS TABS Take 2,000 Units by mouth daily.     . cromolyn (NASALCROM) 5.2 MG/ACT nasal spray Place 1 spray into both nostrils 4 (four) times daily.    . Cyanocobalamin (VITAMIN B 12 PO) Take 1 capsule by mouth.    . diclofenac sodium (VOLTAREN) 1 % GEL Reported on  11/28/2015  1  . loperamide (IMODIUM) 2 MG capsule Take 2 mg by mouth as needed for diarrhea or loose stools.    . mirtazapine (REMERON) 15 MG tablet Take 1 tablet (15 mg total) at bedtime by mouth. 90 tablet 1  . nystatin ointment (MYCOSTATIN) Apply 1 application topically  2 (two) times daily.    Marland Kitchen olmesartan (BENICAR) 40 MG tablet Take 1 tablet (40 mg total) by mouth daily. 30 tablet 6  . omeprazole (PRILOSEC OTC) 20 MG tablet Take 1 tablet (20 mg total) by mouth daily. 90 tablet 4  . QUEtiapine (SEROQUEL) 25 MG tablet TAKE 1 TABLET (25 MG TOTAL) BY MOUTH AT BEDTIME. 90 tablet 0  . TAZORAC 0.05 % cream APPLY TO AFFECTED AREA IN THE EVENING  1  . triamcinolone ointment (KENALOG) 0.1 % Apply 1 application topically 2 (two) times daily.     No current facility-administered medications for this visit.     PHYSICAL EXAMINATION: ECOG PERFORMANCE STATUS: 1 - Symptomatic but completely ambulatory  Vitals:   07/29/17 0935  BP: (!) 154/66  Pulse: 87  Resp: 18  Temp: 97.7 F (36.5 C)  SpO2: 99%   Filed Weights   07/29/17 0935  Weight: 162 lb 12.8 oz (73.8 kg)    GENERAL:alert, no distress and comfortable SKIN: skin color, texture, turgor are normal, no rashes or significant lesions EYES: normal, Conjunctiva are pink and non-injected, sclera clear OROPHARYNX:no exudate, no erythema and lips, buccal mucosa, and tongue normal  NECK: supple, thyroid normal size, non-tender, without nodularity LYMPH:  no palpable lymphadenopathy in the cervical, axillary or inguinal LUNGS: clear to auscultation and percussion with normal breathing effort HEART: regular rate & rhythm and no murmurs and no lower extremity edema ABDOMEN:abdomen soft, non-tender and normal bowel sounds MUSCULOSKELETAL:no cyanosis of digits and no clubbing  NEURO: alert & oriented x 3 with fluent speech, no focal motor/sensory deficits EXTREMITIES: No lower extremity edema  LABORATORY DATA:  I have reviewed the data as listed CMP Latest Ref Rng & Units 07/07/2017 06/03/2017 02/17/2017  Glucose 70 - 99 mg/dL 88 91 -  BUN 6 - 23 mg/dL 17 21 -  Creatinine 0.40 - 1.20 mg/dL 0.98 1.04 -  Sodium 135 - 145 mEq/L 142 142 -  Potassium 3.5 - 5.1 mEq/L 3.9 3.7 -  Chloride 96 - 112 mEq/L 108 109 -  CO2 19  - 32 mEq/L 27 28 -  Calcium 8.4 - 10.5 mg/dL 9.3 9.8 -  Total Protein 6.0 - 8.3 g/dL - 6.7 7.1  Total Bilirubin 0.2 - 1.2 mg/dL - 0.6 0.5  Alkaline Phos 39 - 117 U/L - 57 86  AST 0 - 37 U/L - 22 16  ALT 0 - 35 U/L - 27 21    Lab Results  Component Value Date   WBC 8.8 06/03/2017   HGB 12.9 06/03/2017   HCT 39.1 06/03/2017   MCV 94.7 06/03/2017   PLT 204.0 06/03/2017   NEUTROABS 6.1 06/03/2017    ASSESSMENT & PLAN:  Malignant neoplasm of upper-outer quadrant of right breast in female, estrogen receptor positive ([place]) 07/10/2017:7 mm right breast calcifications, stereotactic biopsy revealed invasive lobular cancer with LCIS ER 95%, PR 2%, Ki-67 5%, HER-2 negative ratio 1.31, T1 BN 0 stage I a clinical stage (Right lumpectomy 06/14/2016: LCIS with necrosis 3 cm, broadly involves the posterior margin, additional deep margin LCIS with necrosis 1.5 cm focally 0.1 cm from posterior margin.)  Pathology and radiology counseling:Discussed with the patient, the  details of pathology including the type of breast cancer,the clinical staging, the significance of ER, PR and HER-2/neu receptors and the implications for treatment. After reviewing the pathology in detail, we proceeded to discuss the different treatment options between surgery, radiation, chemotherapy, antiestrogen therapies.  Recommendations: 1. Breast conserving surgery followed by 2. Adjuvant radiation therapy followed by 3. Adjuvant antiestrogen therapy  Return to clinic after surgery to discuss final pathology report  I spent 25 minutes talking to the patient of which more than half was spent in counseling and coordination of care.  No orders of the defined types were placed in this encounter.  The patient has a good understanding of the overall plan. she agrees with it. she will call with any problems that may develop before the next visit here.   Harriette Ohara, MD 07/29/17

## 2017-07-31 ENCOUNTER — Other Ambulatory Visit: Payer: Self-pay | Admitting: General Practice

## 2017-07-31 ENCOUNTER — Ambulatory Visit
Admission: RE | Admit: 2017-07-31 | Discharge: 2017-07-31 | Disposition: A | Discharge status: Home / Self Care | Payer: Medicare Other | Source: Ambulatory Visit | Attending: General Surgery | Admitting: General Surgery

## 2017-07-31 ENCOUNTER — Telehealth: Payer: Self-pay | Admitting: Hematology and Oncology

## 2017-07-31 DIAGNOSIS — Z170 Estrogen receptor positive status [ER+]: Principal | ICD-10-CM

## 2017-07-31 DIAGNOSIS — C50411 Malignant neoplasm of upper-outer quadrant of right female breast: Secondary | ICD-10-CM

## 2017-07-31 MED ORDER — OLMESARTAN MEDOXOMIL 40 MG PO TABS: 40.0000 mg | ORAL_TABLET | Freq: Every day | ORAL | 1 refills | Status: DC

## 2017-07-31 MED ORDER — AMLODIPINE BESYLATE 5 MG PO TABS: 5.0000 mg | ORAL_TABLET | Freq: Every day | ORAL | 0 refills | Status: DC

## 2017-07-31 MED ORDER — GADOBENATE DIMEGLUMINE 529 MG/ML IV SOLN
15.0000 mL | Freq: Once | INTRAVENOUS | Status: AC | PRN
  Administered 2017-07-31: 15 mL via INTRAVENOUS

## 2017-07-31 NOTE — Telephone Encounter (Signed)
No 1/29 los °

## 2017-08-06 ENCOUNTER — Other Ambulatory Visit: Payer: Self-pay | Admitting: General Surgery

## 2017-08-06 DIAGNOSIS — N6320 Unspecified lump in the left breast, unspecified quadrant: Secondary | ICD-10-CM

## 2017-08-07 ENCOUNTER — Encounter: Payer: Self-pay | Admitting: Genetic Counselor

## 2017-08-07 ENCOUNTER — Other Ambulatory Visit: Payer: Self-pay

## 2017-08-07 ENCOUNTER — Telehealth: Payer: Self-pay | Admitting: Genetic Counselor

## 2017-08-07 ENCOUNTER — Ambulatory Visit: Payer: Self-pay | Admitting: Genetic Counselor

## 2017-08-07 ENCOUNTER — Other Ambulatory Visit: Payer: Self-pay | Admitting: General Surgery

## 2017-08-07 DIAGNOSIS — Z170 Estrogen receptor positive status [ER+]: Secondary | ICD-10-CM

## 2017-08-07 DIAGNOSIS — N6320 Unspecified lump in the left breast, unspecified quadrant: Secondary | ICD-10-CM

## 2017-08-07 DIAGNOSIS — C50411 Malignant neoplasm of upper-outer quadrant of right female breast: Secondary | ICD-10-CM

## 2017-08-07 DIAGNOSIS — Z853 Personal history of malignant neoplasm of breast: Secondary | ICD-10-CM

## 2017-08-07 DIAGNOSIS — D0501 Lobular carcinoma in situ of right breast: Secondary | ICD-10-CM

## 2017-08-07 DIAGNOSIS — Z1379 Encounter for other screening for genetic and chromosomal anomalies: Secondary | ICD-10-CM

## 2017-08-07 NOTE — Telephone Encounter (Signed)
Revealed negative genetic testing.  Discussed that we do not know why she has breast cancer or why there is cancer in the family. It could be due to a different gene that we are not testing, or maybe our current technology may not be able to pick something up.  It will be important for her to keep in contact with genetics to keep up with whether additional testing may be needed. 

## 2017-08-07 NOTE — Progress Notes (Signed)
HPI: Ms. Walts was previously seen in the [place] clinic due to a personal and family history of cancer and concerns regarding a hereditary predisposition to cancer. Please refer to our prior cancer genetics clinic note for more information regarding Ms. Lorton's medical, social and family histories, and our assessment and recommendations, at the time. Ms. Towle's recent genetic test results were disclosed to her, as were recommendations warranted by these results. These results and recommendations are discussed in more detail below.  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  Did not take antiestrogen therapy        07/10/2017 Initial Diagnosis    7 mm right breast calcifications, stereotactic biopsy revealed invasive lobular cancer with LCIS ER 95%, PR 2%, Ki-67 5%, HER-2 negative ratio 1.31, T1 BN 0 stage I a clinical stage      08/06/2017 Genetic Testing    Negative genetic testing on the STAT cancer panel.  The STAT Breast cancer panel offered by [facility] includes sequencing and rearrangement analysis for the following 9 genes:  ATM, BRCA1, BRCA2, CDH1, CHEK2, PALB2, PTEN, STK11 and TP53.   The report date is July 28, 2017.  The report was reflexed to the common hereditary cancer panel.  Negative genetic testing on the common hereditary cancer panel was reported out on August 07, 2107.  The Hereditary Gene Panel offered by [facility] includes  sequencing and/or deletion duplication testing of the following 47 genes: APC, ATM, AXIN2, BARD1, BMPR1A, BRCA1, BRCA2, BRIP1, CDH1, CDK4, CDKN2A (p14ARF), CDKN2A (p16INK4a), CHEK2, CTNNA1, DICER1, EPCAM (Deletion/duplication testing only), GREM1 (promoter region deletion/duplication testing only), KIT, MEN1, MLH1, MSH2, MSH3, MSH6, MUTYH, NBN, NF1, NHTL1, PALB2, PDGFRA, PMS2, POLD1, POLE, PTEN, RAD50, RAD51C, RAD51D, SDHB, SDHC, SDHD, SMAD4, SMARCA4. STK11, TP53, TSC1, TSC2, and VHL.  The following genes were evaluated for sequence changes only: SDHA and HOXB13 c.251G>A variant only.         FAMILY HISTORY:  We obtained a detailed, 4-generation family history.  Significant diagnoses are listed below: Family History  Problem Relation Age of Onset  . Hypertension Mother   . Colon polyps Mother   . Stroke Father   . Cancer Brother        throat  . Pancreatic cancer Other        dx in her 92s  . Breast cancer Cousin        paternal cousin died in her 32s  . Colon cancer Neg Hx   . Stomach cancer Neg Hx     The patient has a son and daughter who are cancer free.  She had three sisters and two brothers.  One brother had throat cancer, and one sister has a daughter with pancreatic cancer.  Both parents are deceased.  The patient's mother had colon polyps, but died at 5.  She has several siblings, but the patient is not aware of any with cancer.  The grandparents died of non cancer  related issues.  The patient's father died of a stroke.  He had three sisters and a brother who were cancer free but one sister had a daughter who died of breast cancer in her 41's.  Both grandparents are deceased.  Ms. Gartner is unaware of previous family history of genetic testing for hereditary cancer risks. Patient's ancestors are of [place] descent.. There is no reported Ashkenazi Jewish ancestry. There is no known consanguinity.  GENETIC TEST RESULTS: Genetic testing reported out on July 28, 2017 through the STAT panel found no deleterious mutation.  This was reflexed to a larger panel.  Genetic testing reported out on August 06, 2017 through the common hereditary cancer panel found no deleterious mutations.  The Hereditary Gene Panel offered by [facility] includes sequencing and/or deletion duplication testing of the following 47 genes: APC, ATM, AXIN2, BARD1, BMPR1A, BRCA1, BRCA2, BRIP1, CDH1, CDK4, CDKN2A (p14ARF), CDKN2A (p16INK4a), CHEK2, CTNNA1, DICER1, EPCAM (Deletion/duplication testing only), GREM1 (promoter region deletion/duplication testing only), KIT, MEN1, MLH1, MSH2, MSH3, MSH6, MUTYH, NBN, NF1, NHTL1, PALB2, PDGFRA, PMS2, POLD1, POLE, PTEN, RAD50, RAD51C, RAD51D, SDHB, SDHC, SDHD, SMAD4, SMARCA4. STK11, TP53, TSC1, TSC2, and VHL.  The following genes were evaluated for sequence changes only: SDHA and HOXB13 c.251G>A variant only.   The test report has been scanned into EPIC and is located under the Molecular Pathology section of the Results Review tab.   We discussed with Ms. Flinders that since the current genetic testing is not perfect, it is possible there may be a gene mutation in one of these genes that current testing cannot detect, but that chance is small. We also discussed, that it is possible that another gene that has not yet been discovered, or that we have not yet tested, is responsible for the cancer diagnoses in the family, and it is, therefore, important to remain in touch with cancer genetics in the future so that we can continue to offer Ms. Halls the most up to date genetic testing.        CANCER SCREENING RECOMMENDATIONS: This result is reassuring and indicates that Ms. Donalson likely does not have an increased risk for a future cancer due to a mutation in one of these genes. This normal test also suggests that Ms. Berenguer's cancer was most likely not due to an inherited predisposition associated with one of these genes.  Most cancers happen by chance and  this negative test suggests that her cancer falls into this category.  We, therefore, recommended she continue to follow the cancer management and screening guidelines provided by her oncology and primary healthcare provider.   RECOMMENDATIONS FOR FAMILY MEMBERS: Women in this family might be at some increased risk of developing cancer, over the general population risk, simply due to the family history of cancer. We recommended women in this family have a yearly mammogram beginning at age 32, or 46 years younger than the earliest onset of cancer, an annual clinical breast exam, and perform monthly breast self-exams. Women in this family should also have a gynecological exam as recommended by their primary provider. All family members should have a colonoscopy by age 64.  FOLLOW-UP: Lastly, we discussed with Ms. Kozicki that cancer genetics is a rapidly advancing field and it is possible that new genetic tests will be appropriate for her and/or her family members in the future. We encouraged her to remain in contact with cancer genetics on an annual basis so we can update her personal and family histories and let her know  of advances in cancer genetics that may benefit this family.   Our contact number was provided. Ms. Roznowski's questions were answered to her satisfaction, and she knows she is welcome to call us at anytime with additional questions or concerns.   Roma Kayser, MS, [facility] Certified Genetic Counselor [email or web address]

## 2017-08-08 ENCOUNTER — Other Ambulatory Visit: Payer: Self-pay | Admitting: General Surgery

## 2017-08-08 ENCOUNTER — Ambulatory Visit
Admission: RE | Admit: 2017-08-08 | Discharge: 2017-08-08 | Disposition: A | Discharge status: Home / Self Care | Payer: Medicare Other | Source: Ambulatory Visit | Attending: General Surgery | Admitting: General Surgery

## 2017-08-08 DIAGNOSIS — R9389 Abnormal findings on diagnostic imaging of other specified body structures: Secondary | ICD-10-CM

## 2017-08-08 DIAGNOSIS — N6320 Unspecified lump in the left breast, unspecified quadrant: Secondary | ICD-10-CM

## 2017-08-13 ENCOUNTER — Other Ambulatory Visit: Payer: Self-pay | Admitting: Family Medicine

## 2017-08-18 ENCOUNTER — Ambulatory Visit
Admission: RE | Admit: 2017-08-18 | Discharge: 2017-08-18 | Disposition: A | Discharge status: Home / Self Care | Payer: Medicare Other | Source: Ambulatory Visit | Attending: General Surgery | Admitting: General Surgery

## 2017-08-18 DIAGNOSIS — R9389 Abnormal findings on diagnostic imaging of other specified body structures: Secondary | ICD-10-CM

## 2017-08-18 MED ORDER — GADOBENATE DIMEGLUMINE 529 MG/ML IV SOLN
15.0000 mL | Freq: Once | INTRAVENOUS | Status: AC | PRN
  Administered 2017-08-18: 15 mL via INTRAVENOUS

## 2017-08-19 ENCOUNTER — Other Ambulatory Visit: Payer: Self-pay | Admitting: Family Medicine

## 2017-08-20 ENCOUNTER — Ambulatory Visit: Payer: Self-pay | Admitting: General Surgery

## 2017-08-20 DIAGNOSIS — C50011 Malignant neoplasm of nipple and areola, right female breast: Secondary | ICD-10-CM

## 2017-08-20 DIAGNOSIS — C50012 Malignant neoplasm of nipple and areola, left female breast: Principal | ICD-10-CM

## 2017-08-21 ENCOUNTER — Telehealth: Payer: Self-pay | Admitting: Hematology and Oncology

## 2017-08-21 NOTE — Telephone Encounter (Signed)
Mailed patient calendar of upcoming March appointments °

## 2017-08-27 ENCOUNTER — Encounter (HOSPITAL_COMMUNITY): Payer: Self-pay

## 2017-08-27 ENCOUNTER — Other Ambulatory Visit: Payer: Self-pay

## 2017-08-27 ENCOUNTER — Encounter (HOSPITAL_COMMUNITY)
Admission: RE | Admit: 2017-08-27 | Discharge: 2017-08-27 | Disposition: A | Discharge status: Home / Self Care | Payer: Medicare Other | Source: Ambulatory Visit | Attending: General Surgery | Admitting: General Surgery

## 2017-08-27 DIAGNOSIS — Z01818 Encounter for other preprocedural examination: Secondary | ICD-10-CM | POA: Insufficient documentation

## 2017-08-27 LAB — BASIC METABOLIC PANEL
Anion gap: 7 (ref 5–15)
BUN: 17 mg/dL (ref 6–20)
CO2: 24 mmol/L (ref 22–32)
Calcium: 9.4 mg/dL (ref 8.9–10.3)
Chloride: 110 mmol/L (ref 101–111)
Creatinine, Ser: 0.96 mg/dL (ref 0.44–1.00)
GFR calc Af Amer: >60 mL/min (ref >60)
GFR calc non Af Amer: 54 mL/min — ABNORMAL LOW (ref >60)
Glucose, Bld: 106 mg/dL — ABNORMAL HIGH (ref 65–99)
Potassium: 3.7 mmol/L (ref 3.5–5.1)
Sodium: 141 mmol/L (ref 135–145)

## 2017-08-27 LAB — CBC
HCT: 39.8 % (ref 36.0–46.0)
Hemoglobin: 13 g/dL (ref 12.0–15.0)
MCH: 30.4 pg (ref 26.0–34.0)
MCHC: 32.7 g/dL (ref 30.0–36.0)
MCV: 93 fL (ref 78.0–100.0)
Platelets: 183 10*3/uL (ref 150–400)
RBC: 4.28 MIL/uL (ref 3.87–5.11)
RDW: 12.8 % (ref 11.5–15.5)
WBC: 7.2 10*3/uL (ref 4.0–10.5)

## 2017-08-27 NOTE — Pre-Procedure Instructions (Signed)
    Jacqueline [name]  08/27/2017      [id number] - [place], [place] - [address] [place] [address] Phone: [phone] Fax: [phone]    Your procedure is scheduled on Thursday, September 04, 2017  Report to [facility] Admitting at 5:30 A.M.  Call this number if you have problems the morning of surgery:  [phone]   Remember:  Do not eat food or drink liquids after midnight Wednesday, September 03, 2017  Take these medicines the morning of surgery with A SIP OF WATER : amLODipine (NORVASC), loratadine (CLARITIN), omeprazole (PRILOSEC), if needed: acetaminophen (TYLENOL) for pain, cromolyn (NASALCROM)  nasal spray for allergies Stop taking Aspirin, vitamins, fish oil, Turmeric and herbal medications. Do not take any NSAIDs ie: Ibuprofen, Advil, Naproxen (Aleve), diclofenac sodium (VOLTAREN), Motrin, BC and Goody Powder; stop Thursday, August 28, 2017  Do not wear jewelry, make-up or nail polish.  Do not wear lotions, powders, or perfumes, or deodorant.  Do not shave 48 hours prior to surgery.    Do not bring valuables to the hospital.  [facility] is not responsible for any belongings or valuables. Contacts, dentures or bridgework may not be worn into surgery.  Patients discharged the day of surgery will not be allowed to drive home.  Special instructions: Shower the night before surgery and morning of surgery with CHG. Please read over the following fact sheets that you were given. Pain Booklet, Coughing and Deep Breathing and Surgical Site Infection Prevention

## 2017-08-27 NOTE — Progress Notes (Signed)
Pt denies SOB, chest pain, and being under the care of a cardiologist. Pt denies having a stress test, echo and cardiac cath. Pt denies having an EKG and chest x ray within the last year. Pt denies having recent labs.

## 2017-09-04 ENCOUNTER — Ambulatory Visit (HOSPITAL_COMMUNITY): Payer: Medicare Other | Admitting: Certified Registered Nurse Anesthetist

## 2017-09-04 ENCOUNTER — Other Ambulatory Visit: Payer: Self-pay

## 2017-09-04 ENCOUNTER — Ambulatory Visit (HOSPITAL_COMMUNITY)
Admission: RE | Admit: 2017-09-04 | Discharge: 2017-09-04 | Disposition: A | Discharge status: Home / Self Care | Payer: Medicare Other | Source: Ambulatory Visit | Attending: General Surgery | Admitting: General Surgery

## 2017-09-04 ENCOUNTER — Encounter (HOSPITAL_COMMUNITY)
Admission: RE | Disposition: A | Discharge status: Home / Self Care | Payer: Self-pay | Source: Ambulatory Visit | Attending: General Surgery

## 2017-09-04 ENCOUNTER — Encounter (HOSPITAL_COMMUNITY): Payer: Self-pay | Admitting: Certified Registered Nurse Anesthetist

## 2017-09-04 ENCOUNTER — Ambulatory Visit (HOSPITAL_COMMUNITY)
Admission: RE | Admit: 2017-09-04 | Discharge: 2017-09-05 | Disposition: A | Discharge status: Home / Self Care | Payer: Medicare Other | Source: Ambulatory Visit | Attending: General Surgery | Admitting: General Surgery

## 2017-09-04 DIAGNOSIS — Z923 Personal history of irradiation: Secondary | ICD-10-CM | POA: Diagnosis not present

## 2017-09-04 DIAGNOSIS — Z170 Estrogen receptor positive status [ER+]: Secondary | ICD-10-CM | POA: Insufficient documentation

## 2017-09-04 DIAGNOSIS — K219 Gastro-esophageal reflux disease without esophagitis: Secondary | ICD-10-CM | POA: Diagnosis not present

## 2017-09-04 DIAGNOSIS — Z885 Allergy status to narcotic agent status: Secondary | ICD-10-CM | POA: Diagnosis not present

## 2017-09-04 DIAGNOSIS — C50411 Malignant neoplasm of upper-outer quadrant of right female breast: Secondary | ICD-10-CM | POA: Insufficient documentation

## 2017-09-04 DIAGNOSIS — L821 Other seborrheic keratosis: Secondary | ICD-10-CM | POA: Diagnosis not present

## 2017-09-04 DIAGNOSIS — Z79899 Other long term (current) drug therapy: Secondary | ICD-10-CM | POA: Insufficient documentation

## 2017-09-04 DIAGNOSIS — C50919 Malignant neoplasm of unspecified site of unspecified female breast: Secondary | ICD-10-CM | POA: Diagnosis present

## 2017-09-04 DIAGNOSIS — Z886 Allergy status to analgesic agent status: Secondary | ICD-10-CM | POA: Diagnosis not present

## 2017-09-04 DIAGNOSIS — C50012 Malignant neoplasm of nipple and areola, left female breast: Principal | ICD-10-CM

## 2017-09-04 DIAGNOSIS — C50011 Malignant neoplasm of nipple and areola, right female breast: Secondary | ICD-10-CM

## 2017-09-04 DIAGNOSIS — I10 Essential (primary) hypertension: Secondary | ICD-10-CM | POA: Diagnosis not present

## 2017-09-04 HISTORY — DX: Unspecified glaucoma: H40.9

## 2017-09-04 HISTORY — DX: Unspecified malignant neoplasm of skin, unspecified: C44.90

## 2017-09-04 HISTORY — PX: MASTECTOMY COMPLETE / SIMPLE W/ SENTINEL NODE BIOPSY: SUR846

## 2017-09-04 HISTORY — DX: Malignant neoplasm of unspecified site of right female breast: C50.911

## 2017-09-04 HISTORY — DX: Migraine, unspecified, not intractable, without status migrainosus: G43.909

## 2017-09-04 HISTORY — PX: MASTECTOMY W/ SENTINEL NODE BIOPSY: SHX2001

## 2017-09-04 HISTORY — DX: Unspecified rotator cuff tear or rupture of right shoulder, not specified as traumatic: M75.101

## 2017-09-04 HISTORY — DX: Personal history of urinary calculi: Z87.442

## 2017-09-04 HISTORY — DX: Malignant neoplasm of unspecified site of left female breast: C50.912

## 2017-09-04 HISTORY — DX: Family history of other specified conditions: Z84.89

## 2017-09-04 HISTORY — DX: Unspecified osteoarthritis, unspecified site: M19.90

## 2017-09-04 SURGERY — MASTECTOMY WITH SENTINEL LYMPH NODE BIOPSY
Anesthesia: General | Site: Breast | Laterality: Bilateral

## 2017-09-04 MED ORDER — ONDANSETRON HCL 4 MG/2ML IJ SOLN
INTRAMUSCULAR | Status: DC | PRN
  Administered 2017-09-04: 4 mg via INTRAVENOUS

## 2017-09-04 MED ORDER — QUETIAPINE FUMARATE 50 MG PO TABS
25.0000 mg | ORAL_TABLET | Freq: Every day | ORAL | Status: DC
  Administered 2017-09-04: 25 mg via ORAL
  Filled 2017-09-04: qty 1

## 2017-09-04 MED ORDER — HYDROCODONE-ACETAMINOPHEN 5-325 MG PO TABS: 1.0000 | ORAL_TABLET | ORAL | Status: DC | PRN

## 2017-09-04 MED ORDER — 0.9 % SODIUM CHLORIDE (POUR BTL) OPTIME
TOPICAL | Status: DC | PRN
  Administered 2017-09-04: 1000 mL

## 2017-09-04 MED ORDER — GABAPENTIN 300 MG PO CAPS
ORAL_CAPSULE | ORAL | Status: AC
  Administered 2017-09-04: 300 mg
  Filled 2017-09-04: qty 1

## 2017-09-04 MED ORDER — EPHEDRINE SULFATE 50 MG/ML IJ SOLN
INTRAMUSCULAR | Status: DC | PRN
  Administered 2017-09-04: 5 mg via INTRAVENOUS

## 2017-09-04 MED ORDER — MORPHINE SULFATE (PF) 4 MG/ML IV SOLN: 1.0000 mg | INTRAVENOUS | Status: DC | PRN

## 2017-09-04 MED ORDER — KCL IN DEXTROSE-NACL 20-5-0.9 MEQ/L-%-% IV SOLN
INTRAVENOUS | Status: DC
  Administered 2017-09-04: 15:00:00 via INTRAVENOUS
  Filled 2017-09-04 (×2): qty 1000

## 2017-09-04 MED ORDER — FENTANYL CITRATE (PF) 100 MCG/2ML IJ SOLN
25.0000 ug | INTRAMUSCULAR | Status: DC | PRN
  Administered 2017-09-04 (×2): 50 ug via INTRAVENOUS

## 2017-09-04 MED ORDER — MIRTAZAPINE 7.5 MG PO TABS: 7.5000 mg | ORAL_TABLET | Freq: Every day | ORAL | Status: DC

## 2017-09-04 MED ORDER — KCL IN DEXTROSE-NACL 20-5-0.9 MEQ/L-%-% IV SOLN: INTRAVENOUS | Status: DC

## 2017-09-04 MED ORDER — GABAPENTIN 300 MG PO CAPS: 300.0000 mg | ORAL_CAPSULE | ORAL | Status: DC

## 2017-09-04 MED ORDER — ONDANSETRON HCL 4 MG/2ML IJ SOLN: 4.0000 mg | Freq: Four times a day (QID) | INTRAMUSCULAR | Status: DC | PRN

## 2017-09-04 MED ORDER — CHLORHEXIDINE GLUCONATE CLOTH 2 % EX PADS: 6.0000 | MEDICATED_PAD | Freq: Once | CUTANEOUS | Status: DC

## 2017-09-04 MED ORDER — ROCURONIUM BROMIDE 10 MG/ML (PF) SYRINGE
PREFILLED_SYRINGE | INTRAVENOUS | Status: DC | PRN
  Administered 2017-09-04: 50 mg via INTRAVENOUS

## 2017-09-04 MED ORDER — IRBESARTAN 75 MG PO TABS: 37.5000 mg | ORAL_TABLET | Freq: Every day | ORAL | Status: DC

## 2017-09-04 MED ORDER — PROPOFOL 10 MG/ML IV BOLUS
INTRAVENOUS | Status: DC | PRN
  Administered 2017-09-04: 160 mg via INTRAVENOUS

## 2017-09-04 MED ORDER — AMLODIPINE BESYLATE 5 MG PO TABS
5.0000 mg | ORAL_TABLET | Freq: Every day | ORAL | Status: DC
  Administered 2017-09-05: 5 mg via ORAL
  Filled 2017-09-04: qty 1

## 2017-09-04 MED ORDER — ONDANSETRON 4 MG PO TBDP: 4.0000 mg | ORAL_TABLET | Freq: Four times a day (QID) | ORAL | Status: DC | PRN

## 2017-09-04 MED ORDER — MIRTAZAPINE 7.5 MG PO TABS
7.5000 mg | ORAL_TABLET | Freq: Every day | ORAL | Status: DC
  Administered 2017-09-04: 7.5 mg via ORAL
  Filled 2017-09-04: qty 1

## 2017-09-04 MED ORDER — LATANOPROST 0.005 % OP SOLN: 1.0000 [drp] | Freq: Every day | OPHTHALMIC | Status: DC

## 2017-09-04 MED ORDER — TECHNETIUM TC 99M SULFUR COLLOID FILTERED
1.0000 | Freq: Once | INTRAVENOUS | Status: AC | PRN
  Administered 2017-09-04: 1 via INTRADERMAL

## 2017-09-04 MED ORDER — ROCURONIUM BROMIDE 50 MG/5ML IV SOLN
INTRAVENOUS | Status: AC
  Filled 2017-09-04: qty 1

## 2017-09-04 MED ORDER — PANTOPRAZOLE SODIUM 40 MG IV SOLR: 40.0000 mg | Freq: Every day | INTRAVENOUS | Status: DC

## 2017-09-04 MED ORDER — HEPARIN SODIUM (PORCINE) 5000 UNIT/ML IJ SOLN
5000.0000 [IU] | Freq: Three times a day (TID) | INTRAMUSCULAR | Status: DC
  Administered 2017-09-05: 5000 [IU] via SUBCUTANEOUS
  Filled 2017-09-04: qty 1

## 2017-09-04 MED ORDER — SUGAMMADEX SODIUM 200 MG/2ML IV SOLN
INTRAVENOUS | Status: AC
  Filled 2017-09-04: qty 2

## 2017-09-04 MED ORDER — LACTATED RINGERS IV SOLN
INTRAVENOUS | Status: DC | PRN
  Administered 2017-09-04 (×2): via INTRAVENOUS

## 2017-09-04 MED ORDER — ACETAMINOPHEN 500 MG PO TABS
ORAL_TABLET | ORAL | Status: AC
  Administered 2017-09-04: 1000 mg
  Filled 2017-09-04: qty 2

## 2017-09-04 MED ORDER — FENTANYL CITRATE (PF) 100 MCG/2ML IJ SOLN
INTRAMUSCULAR | Status: DC | PRN
  Administered 2017-09-04 (×6): 25 ug via INTRAVENOUS

## 2017-09-04 MED ORDER — AMLODIPINE BESYLATE 5 MG PO TABS: 5.0000 mg | ORAL_TABLET | Freq: Every day | ORAL | Status: DC

## 2017-09-04 MED ORDER — PANTOPRAZOLE SODIUM 40 MG IV SOLR
40.0000 mg | Freq: Every day | INTRAVENOUS | Status: DC
Start: 2017-09-04 — End: 2017-09-05
  Administered 2017-09-05: 40 mg via INTRAVENOUS
  Filled 2017-09-04 (×2): qty 40

## 2017-09-04 MED ORDER — IRBESARTAN 75 MG PO TABS
37.5000 mg | ORAL_TABLET | Freq: Every day | ORAL | Status: DC
  Administered 2017-09-05: 37.5 mg via ORAL
  Filled 2017-09-04: qty 1

## 2017-09-04 MED ORDER — CEFAZOLIN SODIUM-DEXTROSE 2-4 GM/100ML-% IV SOLN
INTRAVENOUS | Status: AC
  Filled 2017-09-04: qty 100

## 2017-09-04 MED ORDER — ONDANSETRON HCL 4 MG/2ML IJ SOLN
INTRAMUSCULAR | Status: AC
  Filled 2017-09-04: qty 2

## 2017-09-04 MED ORDER — HEPARIN SODIUM (PORCINE) 5000 UNIT/ML IJ SOLN: 5000.0000 [IU] | Freq: Three times a day (TID) | INTRAMUSCULAR | Status: DC

## 2017-09-04 MED ORDER — LORATADINE 10 MG PO TABS: 10.0000 mg | ORAL_TABLET | Freq: Every day | ORAL | Status: DC

## 2017-09-04 MED ORDER — PROPOFOL 10 MG/ML IV BOLUS
INTRAVENOUS | Status: AC
  Filled 2017-09-04: qty 20

## 2017-09-04 MED ORDER — ACETAMINOPHEN 500 MG PO TABS: 1000.0000 mg | ORAL_TABLET | ORAL | Status: DC

## 2017-09-04 MED ORDER — FENTANYL CITRATE (PF) 250 MCG/5ML IJ SOLN
INTRAMUSCULAR | Status: AC
  Filled 2017-09-04: qty 5

## 2017-09-04 MED ORDER — LATANOPROST 0.005 % OP SOLN
1.0000 [drp] | Freq: Every day | OPHTHALMIC | Status: DC
  Administered 2017-09-04: 1 [drp] via OPHTHALMIC
  Filled 2017-09-04: qty 2.5

## 2017-09-04 MED ORDER — EPHEDRINE 5 MG/ML INJ
INTRAVENOUS | Status: AC
  Filled 2017-09-04: qty 10

## 2017-09-04 MED ORDER — METHYLENE BLUE 0.5 % INJ SOLN
INTRAVENOUS | Status: AC
Start: 2017-09-04 — End: 2017-09-04
  Filled 2017-09-04: qty 10

## 2017-09-04 MED ORDER — METHOCARBAMOL 500 MG PO TABS: 500.0000 mg | ORAL_TABLET | Freq: Four times a day (QID) | ORAL | Status: DC | PRN

## 2017-09-04 MED ORDER — LORATADINE 10 MG PO TABS
10.0000 mg | ORAL_TABLET | Freq: Every day | ORAL | Status: DC
  Administered 2017-09-05: 10 mg via ORAL
  Filled 2017-09-04: qty 1

## 2017-09-04 MED ORDER — LIDOCAINE 2% (20 MG/ML) 5 ML SYRINGE
INTRAMUSCULAR | Status: DC | PRN
  Administered 2017-09-04: 60 mg via INTRAVENOUS

## 2017-09-04 MED ORDER — QUETIAPINE FUMARATE 25 MG PO TABS: 25.0000 mg | ORAL_TABLET | Freq: Every day | ORAL | Status: DC

## 2017-09-04 MED ORDER — DEXAMETHASONE SODIUM PHOSPHATE 10 MG/ML IJ SOLN
INTRAMUSCULAR | Status: DC | PRN
  Administered 2017-09-04: 8 mg via INTRAVENOUS

## 2017-09-04 MED ORDER — FENTANYL CITRATE (PF) 100 MCG/2ML IJ SOLN
INTRAMUSCULAR | Status: AC
  Filled 2017-09-04: qty 2

## 2017-09-04 MED ORDER — CEFAZOLIN SODIUM-DEXTROSE 2-4 GM/100ML-% IV SOLN
2.0000 g | INTRAVENOUS | Status: AC
  Administered 2017-09-04: 2 g via INTRAVENOUS

## 2017-09-04 MED ORDER — SUGAMMADEX SODIUM 200 MG/2ML IV SOLN
INTRAVENOUS | Status: DC | PRN
  Administered 2017-09-04: 150 mg via INTRAVENOUS

## 2017-09-04 MED ORDER — DEXAMETHASONE SODIUM PHOSPHATE 10 MG/ML IJ SOLN
INTRAMUSCULAR | Status: AC
  Filled 2017-09-04: qty 1

## 2017-09-04 SURGICAL SUPPLY — 61 items
ADH SKN CLS APL DERMABOND .7 (GAUZE/BANDAGES/DRESSINGS) ×2
APPLIER CLIP 9.375 MED OPEN (MISCELLANEOUS) ×4
APR CLP MED 9.3 20 MLT OPN (MISCELLANEOUS) ×2
BINDER BREAST LRG (GAUZE/BANDAGES/DRESSINGS) IMPLANT
BINDER BREAST XLRG (GAUZE/BANDAGES/DRESSINGS) ×1 IMPLANT
BIOPATCH RED 1 DISK 7.0 (GAUZE/BANDAGES/DRESSINGS) ×6 IMPLANT
BLADE SURG 15 STRL LF DISP TIS (BLADE) IMPLANT
BLADE SURG 15 STRL SS (BLADE) ×2
CANISTER SUCT 3000ML PPV (MISCELLANEOUS) ×2 IMPLANT
CHLORAPREP W/TINT 26ML (MISCELLANEOUS) ×2 IMPLANT
CLIP APPLIE 9.375 MED OPEN (MISCELLANEOUS) ×1 IMPLANT
CONT SPEC 4OZ CLIKSEAL STRL BL (MISCELLANEOUS) ×7 IMPLANT
COVER PROBE W GEL 5X96 (DRAPES) ×2 IMPLANT
COVER SURGICAL LIGHT HANDLE (MISCELLANEOUS) ×2 IMPLANT
DERMABOND ADVANCED (GAUZE/BANDAGES/DRESSINGS) ×2
DERMABOND ADVANCED .7 DNX12 (GAUZE/BANDAGES/DRESSINGS) IMPLANT
DEVICE DISSECT PLASMABLAD 3.0S (MISCELLANEOUS) IMPLANT
DRAIN CHANNEL 19F RND (DRAIN) ×5 IMPLANT
DRAPE CHEST BREAST 15X10 FENES (DRAPES) ×2 IMPLANT
DRSG PAD ABDOMINAL 8X10 ST (GAUZE/BANDAGES/DRESSINGS) ×1 IMPLANT
DRSG TEGADERM 4X4.75 (GAUZE/BANDAGES/DRESSINGS) ×5 IMPLANT
ELECT CAUTERY BLADE 6.4 (BLADE) ×2 IMPLANT
ELECT REM PT RETURN 9FT ADLT (ELECTROSURGICAL) ×2
ELECTRODE REM PT RTRN 9FT ADLT (ELECTROSURGICAL) ×1 IMPLANT
EVACUATOR SILICONE 100CC (DRAIN) ×5 IMPLANT
GLOVE BIO SURGEON STRL SZ7 (GLOVE) ×1 IMPLANT
GLOVE BIO SURGEON STRL SZ7.5 (GLOVE) ×3 IMPLANT
GLOVE BIOGEL PI IND STRL 6 (GLOVE) IMPLANT
GLOVE BIOGEL PI IND STRL 8 (GLOVE) IMPLANT
GLOVE BIOGEL PI INDICATOR 6 (GLOVE) ×2
GLOVE BIOGEL PI INDICATOR 8 (GLOVE) ×1
GLOVE INDICATOR 7.0 STRL GRN (GLOVE) ×1 IMPLANT
GLOVE INDICATOR 7.5 STRL GRN (GLOVE) ×1 IMPLANT
GLOVE SURG SS PI 6.0 STRL IVOR (GLOVE) ×2 IMPLANT
GOWN STRL REUS W/ TWL LRG LVL3 (GOWN DISPOSABLE) ×2 IMPLANT
GOWN STRL REUS W/TWL LRG LVL3 (GOWN DISPOSABLE) ×10
KIT BASIN OR (CUSTOM PROCEDURE TRAY) ×2 IMPLANT
KIT ROOM TURNOVER OR (KITS) ×2 IMPLANT
LIGHT WAVEGUIDE WIDE FLAT (MISCELLANEOUS) IMPLANT
NDL 18GX1X1/2 (RX/OR ONLY) (NEEDLE) IMPLANT
NDL FILTER BLUNT 18X1 1/2 (NEEDLE) IMPLANT
NDL HYPO 25GX1X1/2 BEV (NEEDLE) IMPLANT
NEEDLE 18GX1X1/2 (RX/OR ONLY) (NEEDLE) IMPLANT
NEEDLE FILTER BLUNT 18X 1/2SAF (NEEDLE)
NEEDLE FILTER BLUNT 18X1 1/2 (NEEDLE) IMPLANT
NEEDLE HYPO 25GX1X1/2 BEV (NEEDLE) IMPLANT
NS IRRIG 1000ML POUR BTL (IV SOLUTION) ×2 IMPLANT
PACK GENERAL/GYN (CUSTOM PROCEDURE TRAY) ×2 IMPLANT
PAD ARMBOARD 7.5X6 YLW CONV (MISCELLANEOUS) ×2 IMPLANT
PLASMABLADE 3.0S (MISCELLANEOUS) ×2
SPECIMEN JAR X LARGE (MISCELLANEOUS) ×3 IMPLANT
SPONGE LAP 18X18 X RAY DECT (DISPOSABLE) ×1 IMPLANT
SUT ETHILON 3 0 FSL (SUTURE) ×3 IMPLANT
SUT MNCRL AB 4-0 PS2 18 (SUTURE) ×3 IMPLANT
SUT VIC AB 3-0 54X BRD REEL (SUTURE) IMPLANT
SUT VIC AB 3-0 BRD 54 (SUTURE)
SUT VIC AB 3-0 SH 18 (SUTURE) ×3 IMPLANT
SYR CONTROL 10ML LL (SYRINGE) IMPLANT
TOWEL OR 17X24 6PK STRL BLUE (TOWEL DISPOSABLE) ×1 IMPLANT
TOWEL OR 17X26 10 PK STRL BLUE (TOWEL DISPOSABLE) ×2 IMPLANT
TUBE CONNECTING 12X1/4 (SUCTIONS) ×1 IMPLANT

## 2017-09-04 NOTE — Anesthesia Procedure Notes (Addendum)
Procedure Name: Intubation Date/Time: 09/04/2017 8:28 AM Performed by: Colin Benton, CRNA Pre-anesthesia Checklist: Patient identified, Emergency Drugs available, Suction available and Patient being monitored Patient Re-evaluated:Patient Re-evaluated prior to induction Oxygen Delivery Method: Circle system utilized Preoxygenation: Pre-oxygenation with 100% oxygen Induction Type: IV induction Ventilation: Mask ventilation throughout procedure Laryngoscope Size: Mac and 3 Grade View: Grade II Tube type: Oral Tube size: 7.0 mm Number of attempts: 1 Airway Equipment and Method: Stylet and Oral airway Placement Confirmation: positive ETCO2 and breath sounds checked- equal and bilateral Secured at: 19 cm Tube secured with: Tape Dental Injury: Teeth and Oropharynx as per pre-operative assessment

## 2017-09-04 NOTE — Transfer of Care (Signed)
Immediate Anesthesia Transfer of Care Note  Patient: Jacqueline [name]  Procedure(s) Performed: BILATERAL MASTECTOMIES WITH SENTINEL LYMPH NODE BIOPSY (Bilateral Breast)  Patient Location: PACU  Anesthesia Type:General with regional  Level of Consciousness: awake, alert , oriented and patient cooperative  Airway & Oxygen Therapy: Patient Spontanous Breathing with 6L of O2 via FM  Post-op Assessment: Report given to RN  Post vital signs: Reviewed and stable  Last Vitals:  Vitals:   09/04/17 0610  BP: (!) 151/73  Pulse: 81  Resp: 20  Temp: 36.7 C  SpO2: 98%    Last Pain:  Vitals:   09/04/17 0610  TempSrc: Oral         Complications: No apparent anesthesia complications

## 2017-09-04 NOTE — Anesthesia Procedure Notes (Addendum)
Anesthesia Regional Block: Pectoralis block   Pre-Anesthetic Checklist: ,, timeout performed, Correct Patient, Correct Site, Correct Laterality, Correct Procedure, Correct Position, site marked, Risks and benefits discussed,  Surgical consent,  Pre-op evaluation,  At surgeon's request and post-op pain management  Laterality: Right and Left  Prep: chloraprep       Needles:  Injection technique: Single-shot  Needle Type: Stimulator Needle - 40          Additional Needles:   Procedures: Doppler guided,,,, ultrasound used (permanent image in chart),,,,  Narrative:  Start time: 09/04/2017 7:35 AM End time: 09/04/2017 8:00 AM Injection made incrementally with aspirations every 5 mL.  Performed by: Personally  Anesthesiologist: Belinda Block, MD

## 2017-09-04 NOTE — Op Note (Signed)
09/04/2017  11:16 AM  PATIENT:  Jacqueline [name]  83 y.o. female  PRE-OPERATIVE DIAGNOSIS:  BILATERAL BREAST CANCER  POST-OPERATIVE DIAGNOSIS:  BILATERAL BREAST CANCER  PROCEDURE:  Procedure(s): BILATERAL MASTECTOMIES WITH SENTINEL LYMPH NODE BIOPSY (Bilateral)  SURGEON:  Surgeon(s) and Role:    * Jovita Kussmaul, MD - Primary  PHYSICIAN ASSISTANT:   ASSISTANTS: none   ANESTHESIA:   general  EBL:  25 mL   BLOOD ADMINISTERED:none  DRAINS: (4) Jackson-Pratt drain(s) with closed bulb suction in the prepectoral space   LOCAL MEDICATIONS USED:  NONE  SPECIMEN:  Source of Specimen:  bilateral mastectomies with sentinel nodes  DISPOSITION OF SPECIMEN:  PATHOLOGY  COUNTS:  YES  TOURNIQUET:  * No tourniquets in log *  DICTATION: .Dragon Dictation   After informed consent was obtained the patient was brought to the operating room and placed in the supine position on the operating table.  After adequate induction of general anesthesia the patient's bilateral chest, breast, and axillary areas were prepped with ChloraPrep, allowed to dry, and draped in usual sterile manner.  An appropriate timeout was performed.  Earlier in the day the patient underwent injection of 1 mCi of technetium sulfur colloid in the subareolar position on both breasts.  The neoprobe was sent to technetium and there was a good signal in both axillas.  Next an elliptical incision was made around the nipple and areole complex on the right in order to minimize the excess skin..  The incision was carried through the skin and subcutaneous tissue sharply with the plasma blade.  Breast ducts were used to elevate skin flaps anteriorly towards the saline.  Thin skin flaps were created circumferentially between the breast tissue in the subcutaneous fat.  This dissection was carried all the way to the chest wall.  The breast was then removed from the pectoralis muscle with the pectoralis fascia.  Once the breast was removed it  was oriented with a stitch on the lateral skin.  The neoprobe was then used to identify hot spot in the right axilla.  I was able to identify 3 hot lymph nodes by blunt hemostat dissection.  These nodes were excised sharply with the plasma blade and the lymphatics and small vessels were controlled with clips.  Ex vivo counts on these nodes were between 100 and 300.  This tissue was sent to pathology for further evaluation.  2 small stab incisions were made near the anterior axillary line inferior to the operative bed.  A tonsil clamp was placed through each of these openings and used to bring a 19 [place] round Blake drain into the operative bed.  The lateral drain was placed in the axilla and the medial drain was placed along the chest wall.  The drains were anchored to the skin with 2-0 nylon stitches.  Next the wound was irrigated with copious amounts of saline and found to be hemostatic.  The superior and inferior skin flaps were grossly reapproximated with interrupted 3-0 Vicryl stitches.  The skin was then closed with a running 4-0 Monocryl subcuticular stitch.  Attention was then turned to the left breast.  A similar incision was made with a 10 blade knife.  The incision was carried through the skin and subcutaneous tissue sharply with the plasma blade.  Breast drugs were used to elevate skin flaps anteriorly towards the saline.  Thin skin flaps were then created circumferentially between the breast tissue in the subcutaneous fat.  This dissection was carried all the  way to the chest wall.  The breast was then removed from the pectoralis muscle with the pectoralis fascia.  Once the breast was removed it was oriented with a stitch on the lateral skin.  The breast was then to pathology for further evaluation.  The neoprobe was used to identify hot spot in the left axilla.  I was able to identify 3 hot lymph nodes.  These were excised sharply with the plasma blade and the lymphatics and small vessels were  controlled with clips.  Ex vivo counts on his nodes were between 100 and 300.  These were sent to pathology for further evaluation.  The wound was irrigated with saline and hemostasis was achieved using the plasma blade.  2 small stab incisions were made near the anterior axillary line inferior to the operative bed.  A tonsil clamp was placed to each of these openings and used to bring a 19 [place] round Blake drains into the operative bed.  The lateral drain was placed in the axilla and the medial drain was placed along the chest wall.  The drains were anchored to the skin with 3-0 nylon stitches.  Next the superior and inferior skin flaps were grossly reapproximated with interrupted 3-0 Vicryl stitches.  The skin was then closed with a running 4-0 Monocryl subcuticular stitch.  Dermabond dressings were applied.  The drains were placed to bulb suction and there were good seals.  Sterile dressings were applied.  The patient tolerated the procedure well.  At the end of the case all needle sponge and instrument counts were correct.  The patient was then awakened and taken to recovery in stable condition.  PLAN OF CARE: Admit for overnight observation  PATIENT DISPOSITION:  PACU - hemodynamically stable.   Delay start of Pharmacological VTE agent (>24hrs) due to surgical blood loss or risk of bleeding: no

## 2017-09-04 NOTE — Anesthesia Postprocedure Evaluation (Signed)
Anesthesia Post Note  Patient: Jacqueline [name]  Procedure(s) Performed: BILATERAL MASTECTOMIES WITH SENTINEL LYMPH NODE BIOPSY (Bilateral Breast)     Patient location during evaluation: PACU Anesthesia Type: General and Regional Level of consciousness: awake Pain management: pain level controlled Vital Signs Assessment: post-procedure vital signs reviewed and stable Respiratory status: spontaneous breathing Cardiovascular status: stable Anesthetic complications: no    Last Vitals:  Vitals:   09/04/17 0610  BP: (!) 151/73  Pulse: 81  Resp: 20  Temp: 36.7 C  SpO2: 98%    Last Pain:  Vitals:   09/04/17 0610  TempSrc: Oral                 Loye Vento

## 2017-09-04 NOTE — Anesthesia Preprocedure Evaluation (Addendum)
Anesthesia Evaluation  Patient identified by MRN, date of birth, ID band Patient awake    Reviewed: Allergy & Precautions, NPO status , Patient's Chart, lab work & pertinent test results  History of Anesthesia Complications (+) PONV  Airway Mallampati: II  TM Distance: >3 FB     Dental   Pulmonary neg pulmonary ROS,    breath sounds clear to auscultation       Cardiovascular hypertension,  Rhythm:Regular Rate:Normal     Neuro/Psych  Headaches,    GI/Hepatic Neg liver ROS, GERD  ,  Endo/Other    Renal/GU Renal disease     Musculoskeletal   Abdominal   Peds  Hematology negative hematology ROS (+)   Anesthesia Other Findings   Reproductive/Obstetrics                            Anesthesia Physical Anesthesia Plan  ASA: III  Anesthesia Plan: General   Post-op Pain Management:    Induction:   PONV Risk Score and Plan: 4 or greater and Treatment may vary due to age or medical condition, Ondansetron, Dexamethasone and Midazolam  Airway Management Planned: Oral ETT  Additional Equipment:   Intra-op Plan:   Post-operative Plan: Extubation in OR  Informed Consent: I have reviewed the patients History and Physical, chart, labs and discussed the procedure including the risks, benefits and alternatives for the proposed anesthesia with the patient or authorized representative who has indicated his/her understanding and acceptance.   Dental advisory given  Plan Discussed with: CRNA and Anesthesiologist  Anesthesia Plan Comments:        Anesthesia Quick Evaluation

## 2017-09-04 NOTE — H&P (Signed)
Jacqueline [name]  Location: [facility] Patient #: [id number] DOB: 11-24-1934 Married / Language: English / Race: White Female   History of Present Illness  The patient is a 83 year old female who presents for a follow-up for Breast cancer. The patient is an 83 year old white female who was recently found to have a 7 mm area of calcification in the upper outer right breast. This was biopsied and came back as an invasive lobular cancer. 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 She also has a history of left breast lumpectomy and sentinel node mapping in 2007 for an invasive breast cancer. After both of these cancer she underwent partial breast radiation. Since her last visit she had an MRI study performed that shows the area of enhancement in the outer right breast to measure about 6 cm. 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 She was also found to have a 1 cm area in the outer aspect of the left breast that was biopsied and came back as an invasive ductal cancer.   Allergies ASPIRIN  Nausea. CODEINE  Nausea. Allergies Reconciled   Medication History  AmLODIPine Besylate (5MG  Tablet, Oral) Active. Olmesartan Medoxomil (40MG  Tablet, Oral) Active. Nystatin (100000 UNIT/GM Ointment, External) Active. Triamcinolone Acetonide (0.1% Ointment, External) Active. Vitamin D3 (2000UNIT Tablet, Oral daily) Active. Vitamin B 12 (100MCG Lozenge, Oral daily) Active. Voltaren (1% Gel, External daily) Active. Imodium (2MG  Capsule, Oral three times a week) Active. Remeron (15MG  Tablet, Oral daily) Active. PriLOSEC (20MG  Capsule DR, Oral daily) Active. SEROquel (25MG  Tablet, Oral daily) Active. Tazorac (0.05% Cream, External daily) Active. Diovan (160MG  Tablet, Oral daily) Active. Medications  Reconciled    Review of Systems  General Not Present- Appetite Loss, Chills, Fatigue, Fever, Night Sweats, Weight Gain and Weight Loss. Skin Not Present- Change in Wart/Mole, Dryness, Hives, Jaundice, New Lesions, Non-Healing Wounds, Rash and Ulcer. HEENT Not Present- Earache, Hearing Loss, Hoarseness, Nose Bleed, Oral Ulcers, Ringing in the Ears, Seasonal Allergies, Sinus Pain, Sore Throat, Visual Disturbances, Wears glasses/contact lenses and Yellow Eyes. Breast Not Present- Breast Mass, Breast Pain, Nipple Discharge and Skin Changes. Cardiovascular Not Present- Chest Pain, Difficulty Breathing Lying Down, Leg Cramps, Palpitations, Rapid Heart Rate, Shortness of Breath and Swelling of Extremities. Gastrointestinal Not Present- Abdominal Pain, Bloating, Bloody Stool, Change in Bowel Habits, Chronic diarrhea, Constipation, Difficulty Swallowing, Excessive gas, Gets full quickly at meals, Hemorrhoids, Indigestion, Nausea, Rectal Pain and Vomiting. Female Genitourinary Not Present- Frequency, Nocturia, Painful Urination, Pelvic Pain and Urgency. Musculoskeletal Not Present- Back Pain, Joint Pain, Joint Stiffness, Muscle Pain, Muscle Weakness and Swelling of Extremities. Neurological Not Present- Decreased Memory, Fainting, Headaches, Numbness, Seizures, Tingling, Tremor, Trouble walking and Weakness. Psychiatric Not Present- Anxiety, Bipolar, Change in Sleep Pattern, Depression, Fearful and Frequent crying. Endocrine Not Present- Cold Intolerance, Excessive Hunger, Hair Changes, Heat Intolerance, Hot flashes and New Diabetes. Hematology Not Present- Blood Thinners, Easy Bruising, Excessive bleeding, Gland problems, HIV and Persistent Infections.  Vitals  Weight: 161 lb Height: 63in Body Surface Area: 1.76 m Body Mass Index: 28.52 kg/m  Temp.: 98.25F  Pulse: 86 (Regular)  BP: 124/86 (Sitting, Left Arm, Standard)       Physical Exam General Mental Status-Alert. General  Appearance-Consistent with stated age. Hydration-Well hydrated. Voice-Normal.  Head and Neck Head-normocephalic, atraumatic with no lesions or palpable masses. Trachea-midline. Thyroid Gland  Characteristics - normal size and consistency.  Eye Eyeball - Bilateral-Extraocular movements intact. Sclera/Conjunctiva - Bilateral-No scleral icterus.  Chest and Lung Exam Chest and lung exam reveals -quiet, even and easy respiratory effort with no use of accessory muscles and on auscultation, normal breath sounds, no adventitious sounds and normal vocal resonance. Inspection Chest Wall - Normal. Back - normal.  Breast Note: There are multiple well-healed scars in the upper portion of both breasts. There is no significant palpable mass in either breast. There is no palpable axillary, supraclavicular, or cervical lymphadenopathy.   Cardiovascular Cardiovascular examination reveals -normal heart sounds, regular rate and rhythm with no murmurs and normal pedal pulses bilaterally.  Abdomen Inspection Inspection of the abdomen reveals - No Hernias. Skin - Scar - no surgical scars. Palpation/Percussion Palpation and Percussion of the abdomen reveal - Soft, Non Tender, No Rebound tenderness, No Rigidity (guarding) and No hepatosplenomegaly. Auscultation Auscultation of the abdomen reveals - Bowel sounds normal.  Neurologic Neurologic evaluation reveals -alert and oriented x 3 with no impairment of recent or remote memory. Mental Status-Normal.  Musculoskeletal Normal Exam - Left-Upper Extremity Strength Normal and Lower Extremity Strength Normal. Normal Exam - Right-Upper Extremity Strength Normal and Lower Extremity Strength Normal.  Lymphatic Head & Neck  General Head & Neck Lymphatics: Bilateral - Description - Normal. Axillary  General Axillary Region: Bilateral - Description - Normal. Tenderness - Non Tender. Femoral & Inguinal  Generalized Femoral &  Inguinal Lymphatics: Bilateral - Description - Normal. Tenderness - Non Tender.    Assessment & Plan MALIGNANT NEOPLASM OF UPPER-OUTER QUADRANT OF RIGHT BREAST IN FEMALE, ESTROGEN RECEPTOR POSITIVE (C50.411) MALIGNANT NEOPLASM OF UPPER-OUTER QUADRANT OF LEFT FEMALE BREAST, UNSPECIFIED ESTROGEN RECEPTOR STATUS (C50.412) Impression: The patient has a 5-6 cm area of enhancement in the outer right breast that has both lobular breast cancer and DCIS involvement. There is also a 1 cm area in the upper outer left breast that was biopsied and is an invasive ductal type of breast cancer. Given her history of bilateral breast cancer and her history of radiation therapy I would recommend bilateral mastectomies for this. Since she is in good physical condition she could have sentinel node mapping performed. I have discussed with her in detail the risks and benefits of the operation as well as some of the technical aspects and she understands and wishes to proceed. I will go ahead and refer her to medical and radiation oncology as well.

## 2017-09-04 NOTE — Addendum Note (Signed)
Addendum  created 09/04/17 1709 by Belinda Block, MD   Child order released for a procedure order, Intraprocedure Blocks edited, Sign clinical note

## 2017-09-04 NOTE — Interval H&P Note (Signed)
History and Physical Interval Note:  09/04/2017 7:15 AM  Jacqueline [name]  has presented today for surgery, with the diagnosis of BILATERAL BREAST CANCER  The various methods of treatment have been discussed with the patient and family. After consideration of risks, benefits and other options for treatment, the patient has consented to  Procedure(s): BILATERAL MASTECTOMIES WITH SENTINEL LYMPH NODE BIOPSY (Bilateral) as a surgical intervention .  The patient's history has been reviewed, patient examined, no change in status, stable for surgery.  I have reviewed the patient's chart and labs.  Questions were answered to the patient's satisfaction.     TOTH III,Cydnee Fuquay S

## 2017-09-05 ENCOUNTER — Encounter (HOSPITAL_COMMUNITY): Payer: Self-pay | Admitting: General Surgery

## 2017-09-05 DIAGNOSIS — C50411 Malignant neoplasm of upper-outer quadrant of right female breast: Secondary | ICD-10-CM | POA: Diagnosis not present

## 2017-09-05 MED ORDER — HYDROCODONE-ACETAMINOPHEN 5-325 MG PO TABS: 1.0000 | ORAL_TABLET | Freq: Four times a day (QID) | ORAL | 0 refills | Status: DC | PRN

## 2017-09-05 NOTE — Progress Notes (Signed)
1 Day Post-Op   Subjective/Chief Complaint: No complaints   Objective: Vital signs in last 24 hours: Temp:  [97.7 F (36.5 C)-98.5 F (36.9 C)] 97.8 F (36.6 C) (03/08 0505) Pulse Rate:  [80-86] 84 (03/08 0505) Resp:  [13-27] 20 (03/08 0505) BP: (98-159)/(63-86) 159/78 (03/08 0505) SpO2:  [93 %-99 %] 97 % (03/08 0505) Weight:  [74 kg (163 lb 2.3 oz)] 74 kg (163 lb 2.3 oz) (03/07 1930) Last BM Date: 09/02/17  Intake/Output from previous day: 03/07 0701 - 03/08 0700 In: 2482.2 [P.O.:360; I.V.:1949.2] Out: 538 [Urine:350; Drains:163; Blood:25] Intake/Output this shift: No intake/output data recorded.  General appearance: alert and cooperative Resp: clear to auscultation bilaterally Chest wall: skin flaps look good Cardio: regular rate and rhythm GI: soft, non-tender; bowel sounds normal; no masses,  no organomegaly  Lab Results:  No results for input(s): WBC, HGB, HCT, PLT in the last 72 hours. BMET No results for input(s): NA, K, CL, CO2, GLUCOSE, BUN, CREATININE, CALCIUM in the last 72 hours. PT/INR No results for input(s): LABPROT, INR in the last 72 hours. ABG No results for input(s): PHART, HCO3 in the last 72 hours.  Invalid input(s): PCO2, PO2  Studies/Results: Nm Sentinel Node Inj-no Rpt (breast)  Result Date: 09/04/2017 Sulfur colloid was injected by the nuclear medicine technologist for melanoma sentinel node.    Anti-infectives: Anti-infectives (From admission, onward)   Start     Dose/Rate Route Frequency Ordered Stop   09/04/17 0639  ceFAZolin (ANCEF) 2-4 GM/100ML-% IVPB    Comments:  Nyoka Cowden   : cabinet override      09/04/17 4166 09/04/17 0630   09/04/17 1601  ceFAZolin (ANCEF) IVPB 2g/100 mL premix     2 g 200 mL/hr over 30 Minutes Intravenous On call to O.R. 09/04/17 0932 09/04/17 0848      Assessment/Plan: s/p Procedure(s): BILATERAL MASTECTOMIES WITH SENTINEL LYMPH NODE BIOPSY (Bilateral) Advance diet Discharge  LOS: 0 days     TOTH III,PAUL S 09/05/2017

## 2017-09-05 NOTE — Discharge Summary (Signed)
Physician Discharge Summary  Patient ID: Jacqueline [name] MRN: [id number] DOB/AGE: 03-06-1935 83 y.o.  Admit date: 09/04/2017 Discharge date: 09/05/2017  Admission Diagnoses:  Discharge Diagnoses:  Active Problems:   Breast cancer, female [facility])   Discharged Condition: good  Hospital Course: the pt underwent bilateral mastectomies. She tolerated surgery well. On pod 1 she was ready for discharge home  Consults: None  Significant Diagnostic Studies: none  Treatments: surgery: as above  Discharge Exam: Blood pressure (!) 159/78, pulse 84, temperature 97.8 F (36.6 C), temperature source Oral, resp. rate 20, height 5\' 3"  (1.6 m), weight 74 kg (163 lb 2.3 oz), SpO2 97 %. Chest wall: skin flaps look good  Disposition: 01-Home or Self Care  Discharge Instructions    Call MD for:  difficulty breathing, headache or visual disturbances   Complete by:  As directed    Call MD for:  extreme fatigue   Complete by:  As directed    Call MD for:  hives   Complete by:  As directed    Call MD for:  persistant dizziness or light-headedness   Complete by:  As directed    Call MD for:  persistant nausea and vomiting   Complete by:  As directed    Call MD for:  redness, tenderness, or signs of infection (pain, swelling, redness, odor or green/yellow discharge around incision site)   Complete by:  As directed    Call MD for:  severe uncontrolled pain   Complete by:  As directed    Call MD for:  temperature >100.4   Complete by:  As directed    Diet - low sodium heart healthy   Complete by:  As directed    Discharge instructions   Complete by:  As directed    Sponge bathe while drains are in. No overhead activity. Empty drains, record output, and recharge bulb twice a day   Increase activity slowly   Complete by:  As directed    No wound care   Complete by:  As directed      Allergies as of 09/05/2017      Reactions   Aspirin Nausea Only   stomach upset   Codeine Sulfate Nausea  Only   stomach upset   Lactose Intolerance (gi) Nausea And Vomiting   Stomach upset       Medication List    TAKE these medications   acetaminophen 500 MG tablet Commonly known as:  TYLENOL Take 1,000 mg by mouth daily as needed for moderate pain or headache.   amLODipine 5 MG tablet Commonly known as:  NORVASC Take 1 tablet (5 mg total) by mouth daily.   cholecalciferol 1000 units tablet Commonly known as:  VITAMIN D Take 2,000 Units by mouth every evening.   cromolyn 5.2 MG/ACT nasal spray Commonly known as:  NASALCROM Place 1 spray into both nostrils daily as needed for allergies.   diclofenac sodium 1 % Gel Commonly known as:  VOLTAREN apply topically once daily as needed for pain   fenofibrate 160 MG tablet TAKE 1 TABLET BY MOUTH EVERY DAY   HYDROcodone-acetaminophen 5-325 MG tablet Commonly known as:  NORCO/VICODIN Take 1-2 tablets by mouth every 6 (six) hours as needed for moderate pain.   HYDROcodone-acetaminophen 5-325 MG tablet Commonly known as:  NORCO/VICODIN Take 1-2 tablets by mouth every 6 (six) hours as needed for moderate pain or severe pain.   ibuprofen 200 MG tablet Commonly known as:  ADVIL,MOTRIN Take 400 mg by mouth  daily as needed for headache or moderate pain.   latanoprost 0.005 % ophthalmic solution Commonly known as:  XALATAN Place 1 drop into both eyes at bedtime.   loperamide 2 MG capsule Commonly known as:  IMODIUM Take 3 mg by mouth as needed for diarrhea or loose stools.   loratadine 10 MG tablet Commonly known as:  CLARITIN Take 10 mg by mouth daily.   mirtazapine 15 MG tablet Commonly known as:  REMERON Take 1 tablet (15 mg total) at bedtime by mouth. What changed:  how much to take   nystatin ointment Commonly known as:  MYCOSTATIN Apply 1 application topically every evening.   olmesartan 40 MG tablet Commonly known as:  BENICAR Take 1 tablet (40 mg total) by mouth daily.   omeprazole 20 MG tablet Commonly known  as:  PRILOSEC OTC Take 1 tablet (20 mg total) by mouth daily.   PRESERVISION AREDS 2 Caps Take 1 capsule by mouth 2 (two) times daily.   QUEtiapine 25 MG tablet Commonly known as:  SEROQUEL TAKE 1 TABLET BY MOUTH EVERYDAY AT BEDTIME   TAZORAC 0.05 % cream Generic drug:  tazarotene APPLY TO AFFECTED AREA IN THE EVENING   triamcinolone ointment 0.1 % Commonly known as:  KENALOG Apply 1 application topically See admin instructions. Use with nystatin on Saturdays and Sundays   Turmeric 400 MG Caps Take 400 mg by mouth every evening.   Vitamin B-12 6000 MCG Subl Place 6,000 mcg under the tongue daily.        Signed: TOTH III,Lizzie An S 09/05/2017, 7:08 AM

## 2017-09-05 NOTE — Progress Notes (Signed)
Daughter did a hands on emptying and recharging of patient's JP drains and did well.

## 2017-09-05 NOTE — Progress Notes (Signed)
Bil chest incision w/ skin glue dry and intact. Pain is controlled. Pt is ambulating in the room independently. Educated pt on drain care and emptying. Discharge instructions given to pt, verbalized understanding. Discharged to home accompanied by spouse.

## 2017-09-07 NOTE — Addendum Note (Signed)
Addendum  created 09/07/17 1555 by Belinda Block, MD   Image imported, Intraprocedure Attestations filed, Intraprocedure Blocks edited, Sign clinical note

## 2017-09-12 ENCOUNTER — Telehealth: Payer: Self-pay | Admitting: Hematology and Oncology

## 2017-09-12 ENCOUNTER — Inpatient Hospital Stay: Payer: Medicare Other | Attending: Genetic Counselor | Admitting: Hematology and Oncology

## 2017-09-12 DIAGNOSIS — Z86000 Personal history of in-situ neoplasm of breast: Secondary | ICD-10-CM | POA: Diagnosis not present

## 2017-09-12 DIAGNOSIS — Z170 Estrogen receptor positive status [ER+]: Secondary | ICD-10-CM | POA: Diagnosis not present

## 2017-09-12 DIAGNOSIS — C50411 Malignant neoplasm of upper-outer quadrant of right female breast: Secondary | ICD-10-CM | POA: Diagnosis present

## 2017-09-12 MED ORDER — LETROZOLE 2.5 MG PO TABS: 2.5000 mg | ORAL_TABLET | Freq: Every day | ORAL | 0 refills | Status: DC

## 2017-09-12 NOTE — Progress Notes (Signed)
Patient Care Team: Midge Minium, MD as PCP - General (Family Medicine) Luberta Mutter, MD as Consulting Physician (Ophthalmology) Kristeen Miss, MD as Consulting Physician (Neurosurgery) Elsie Saas, MD as Consulting Physician (Orthopedic Surgery) Jari Pigg, MD as Consulting Physician (Dermatology)  DIAGNOSIS:  Encounter Diagnosis  Name Primary?  . Malignant neoplasm of upper-outer quadrant of right breast in female, estrogen receptor positive ([place])     SUMMARY OF ONCOLOGIC HISTORY:   Malignant neoplasm of upper-outer quadrant of right breast in female, estrogen receptor positive ([place])   2007 Initial Biopsy    Stage I left breast cancer treated with lumpectomy and radiation and 5 years of tamoxifen      2012 Relapse/Recurrence    Right breast DCIS treated with lumpectomy and radiation, did not take antiestrogen therapy      06/14/2016 Surgery    Right lumpectomy 06/14/2016: LCIS with necrosis 3 cm, broadly involves the posterior margin, additional deep margin LCIS with necrosis 1.5 cm focally 0.1 cm from posterior margin.  Did not take antiestrogen therapy        07/10/2017 Initial Diagnosis    7 mm right breast calcifications, stereotactic biopsy revealed invasive lobular cancer with LCIS ER 95%, PR 2%, Ki-67 5%, HER-2 negative ratio 1.31, T1 BN 0 stage I a clinical stage      08/06/2017 Genetic Testing    Negative genetic testing on the STAT cancer panel.  The STAT Breast cancer panel offered by [facility] includes sequencing and rearrangement analysis for the following 9 genes:  ATM, BRCA1, BRCA2, CDH1, CHEK2, PALB2, PTEN, STK11 and TP53.   The report date is July 28, 2017.  The report was reflexed to the common hereditary cancer panel.  Negative genetic testing on the common hereditary cancer panel was reported out on August 07, 2107.  The Hereditary Gene Panel offered by [facility] includes sequencing and/or deletion duplication testing of the following 47  genes: APC, ATM, AXIN2, BARD1, BMPR1A, BRCA1, BRCA2, BRIP1, CDH1, CDK4, CDKN2A (p14ARF), CDKN2A (p16INK4a), CHEK2, CTNNA1, DICER1, EPCAM (Deletion/duplication testing only), GREM1 (promoter region deletion/duplication testing only), KIT, MEN1, MLH1, MSH2, MSH3, MSH6, MUTYH, NBN, NF1, NHTL1, PALB2, PDGFRA, PMS2, POLD1, POLE, PTEN, RAD50, RAD51C, RAD51D, SDHB, SDHC, SDHD, SMAD4, SMARCA4. STK11, TP53, TSC1, TSC2, and VHL.  The following genes were evaluated for sequence changes only: SDHA and HOXB13 c.251G>A variant only.        09/04/2017 Surgery    Right mastectomy: Invasive lobular cancer with LCIS, 2.1 cm, grade 1, DCIS, margins negative, 0/3 lymph nodes negative ER 95%, PR 2%, HER-2 negative ratio 1.31, Ki-67 5%, T2N0 stage Ib Left mastectomy: IDC with DCIS 2.3 cm, grade 2, margins negative, 0/3 lymph nodes negative ER 100%, PR 40%, HER-2 negative ratio 1.55, Ki-67 40%, T2N0 stage Ib       CHIEF COMPLIANT: Follow-up after recent bilateral mastectomies  INTERVAL HISTORY: Jacqueline [name] is a 83 year old with above-mentioned history of bilateral breast cancers who underwent bilateral mastectomies and is here today to discuss the result.  She has drains in place and appears to be healing quite well.  She is not requiring any narcotic pain medications.  REVIEW OF SYSTEMS:   Constitutional: Denies fevers, chills or abnormal weight loss Eyes: Denies blurriness of vision Ears, nose, mouth, throat, and face: Denies mucositis or sore throat Respiratory: Denies cough, dyspnea or wheezes Cardiovascular: Denies palpitation, chest discomfort Gastrointestinal:  Denies nausea, heartburn or change in bowel habits Skin: Denies abnormal skin rashes Lymphatics: Denies new lymphadenopathy or easy bruising  Neurological:Denies numbness, tingling or new weaknesses Behavioral/Psych: Mood is stable, no new changes  Extremities: No lower extremity edema Breast: Recent bilateral mastectomies All other systems  were reviewed with the patient and are negative.  I have reviewed the past medical history, past surgical history, social history and family history with the patient and they are unchanged from previous note.  ALLERGIES:  is allergic to aspirin; codeine sulfate; and lactose intolerance (gi).  MEDICATIONS:  Current Outpatient Medications  Medication Sig Dispense Refill  . acetaminophen (TYLENOL) 500 MG tablet Take 1,000 mg by mouth daily as needed for moderate pain or headache.    Marland Kitchen amLODipine (NORVASC) 5 MG tablet Take 1 tablet (5 mg total) by mouth daily. 90 tablet 0  . cholecalciferol (VITAMIN D) 1000 units tablet Take 2,000 Units by mouth every evening.    . cromolyn (NASALCROM) 5.2 MG/ACT nasal spray Place 1 spray into both nostrils daily as needed for allergies.     . Cyanocobalamin (VITAMIN B-12) 6000 MCG SUBL Place 6,000 mcg under the tongue daily.    . diclofenac sodium (VOLTAREN) 1 % GEL apply topically once daily as needed for pain  1  . fenofibrate 160 MG tablet TAKE 1 TABLET BY MOUTH EVERY DAY (Patient not taking: Reported on 08/20/2017) 30 tablet 5  . HYDROcodone-acetaminophen (NORCO/VICODIN) 5-325 MG tablet Take 1-2 tablets by mouth every 6 (six) hours as needed for moderate pain. 15 tablet 0  . HYDROcodone-acetaminophen (NORCO/VICODIN) 5-325 MG tablet Take 1-2 tablets by mouth every 6 (six) hours as needed for moderate pain or severe pain. 15 tablet 0  . ibuprofen (ADVIL,MOTRIN) 200 MG tablet Take 400 mg by mouth daily as needed for headache or moderate pain.    Marland Kitchen latanoprost (XALATAN) 0.005 % ophthalmic solution Place 1 drop into both eyes at bedtime.    Marland Kitchen loperamide (IMODIUM) 2 MG capsule Take 3 mg by mouth as needed for diarrhea or loose stools.     Marland Kitchen loratadine (CLARITIN) 10 MG tablet Take 10 mg by mouth daily.    . mirtazapine (REMERON) 15 MG tablet Take 1 tablet (15 mg total) at bedtime by mouth. (Patient taking differently: Take 7.5 mg by mouth at bedtime. ) 90 tablet 1  .  Multiple Vitamins-Minerals (PRESERVISION AREDS 2) CAPS Take 1 capsule by mouth 2 (two) times daily.    Marland Kitchen nystatin ointment (MYCOSTATIN) Apply 1 application topically every evening.     . olmesartan (BENICAR) 40 MG tablet Take 1 tablet (40 mg total) by mouth daily. 90 tablet 1  . omeprazole (PRILOSEC OTC) 20 MG tablet Take 1 tablet (20 mg total) by mouth daily. 90 tablet 4  . QUEtiapine (SEROQUEL) 25 MG tablet TAKE 1 TABLET BY MOUTH EVERYDAY AT BEDTIME 90 tablet 0  . TAZORAC 0.05 % cream APPLY TO AFFECTED AREA IN THE EVENING  1  . triamcinolone ointment (KENALOG) 0.1 % Apply 1 application topically See admin instructions. Use with nystatin on Saturdays and Sundays    . Turmeric 400 MG CAPS Take 400 mg by mouth every evening.     No current facility-administered medications for this visit.     PHYSICAL EXAMINATION: ECOG PERFORMANCE STATUS: 1 - Symptomatic but completely ambulatory  Vitals:   09/12/17 0955  BP: 99/68  Pulse: 87  Resp: 18  Temp: (!) 87 F (30.6 C)  SpO2: 100%   Filed Weights   09/12/17 0955  Weight: 160 lb 1.6 oz (72.6 kg)    GENERAL:alert, no distress and comfortable SKIN: skin  color, texture, turgor are normal, no rashes or significant lesions EYES: normal, Conjunctiva are pink and non-injected, sclera clear OROPHARYNX:no exudate, no erythema and lips, buccal mucosa, and tongue normal  NECK: supple, thyroid normal size, non-tender, without nodularity LYMPH:  no palpable lymphadenopathy in the cervical, axillary or inguinal LUNGS: clear to auscultation and percussion with normal breathing effort HEART: regular rate & rhythm and no murmurs and no lower extremity edema ABDOMEN:abdomen soft, non-tender and normal bowel sounds MUSCULOSKELETAL:no cyanosis of digits and no clubbing  NEURO: alert & oriented x 3 with fluent speech, no focal motor/sensory deficits EXTREMITIES: No lower extremity edema  LABORATORY DATA:  I have reviewed the data as listed CMP Latest  Ref Rng & Units 08/27/2017 07/07/2017 06/03/2017  Glucose 65 - 99 mg/dL 106(H) 88 91  BUN 6 - 20 mg/dL '17 17 21  '$ Creatinine 0.44 - 1.00 mg/dL 0.96 0.98 1.04  Sodium 135 - 145 mmol/L 141 142 142  Potassium 3.5 - 5.1 mmol/L 3.7 3.9 3.7  Chloride 101 - 111 mmol/L 110 108 109  CO2 22 - 32 mmol/L '24 27 28  '$ Calcium 8.9 - 10.3 mg/dL 9.4 9.3 9.8  Total Protein 6.0 - 8.3 g/dL - - 6.7  Total Bilirubin 0.2 - 1.2 mg/dL - - 0.6  Alkaline Phos 39 - 117 U/L - - 57  AST 0 - 37 U/L - - 22  ALT 0 - 35 U/L - - 27    Lab Results  Component Value Date   WBC 7.2 08/27/2017   HGB 13.0 08/27/2017   HCT 39.8 08/27/2017   MCV 93.0 08/27/2017   PLT 183 08/27/2017   NEUTROABS 6.1 06/03/2017    ASSESSMENT & PLAN:  Malignant neoplasm of upper-outer quadrant of right breast in female, estrogen receptor positive ([place]) Bilateral mastectomies 09/04/2017: Right mastectomy: Invasive lobular cancer with LCIS, 2.1 cm, grade 1, DCIS, margins negative, 0/3 lymph nodes negative ER 95%, PR 2%, HER-2 negative ratio 1.31, Ki-67 5%, T2N0 stage Ib Left mastectomy: IDC with DCIS 2.3 cm, grade 2, margins negative, 0/3 lymph nodes negative ER 100%, PR 40%, HER-2 negative ratio 1.55, Ki-67 40%, T2N0 stage Ib  Recommendation: Antiestrogen therapy with letrozole 2.5 mg daily  Letrozole counseling: We discussed the risks and benefits of anti-estrogen therapy with aromatase inhibitors. These include but not limited to insomnia, hot flashes, mood changes, vaginal dryness, bone density loss, and weight gain. We strongly believe that the benefits far outweigh the risks. Patient understands these risks and consented to starting treatment. Planned treatment duration is 5 years.  Return to clinic in 3 months for toxicity check and follow-up and survivorship care plan visit  I spent 25 minutes talking to the patient of which more than half was spent in counseling and coordination of care.  No orders of the defined types were placed in this  encounter.  The patient has a good understanding of the overall plan. she agrees with it. she will call with any problems that may develop before the next visit here.   Harriette Ohara, MD 09/12/17

## 2017-09-12 NOTE — Assessment & Plan Note (Addendum)
Bilateral mastectomies 09/04/2017: Right mastectomy: Invasive lobular cancer with LCIS, 2.1 cm, grade 1, DCIS, margins negative, 0/3 lymph nodes negative ER 95%, PR 2%, HER-2 negative ratio 1.31, Ki-67 5%, T2N0 stage Ib Left mastectomy: IDC with DCIS 2.3 cm, grade 2, margins negative, 0/3 lymph nodes negative ER 100%, PR 40%, HER-2 negative ratio 1.55, Ki-67 40%, T2N0 stage Ib  Recommendation: Antiestrogen therapy with letrozole 2.5 mg daily  Letrozole counseling: We discussed the risks and benefits of anti-estrogen therapy with aromatase inhibitors. These include but not limited to insomnia, hot flashes, mood changes, vaginal dryness, bone density loss, and weight gain. We strongly believe that the benefits far outweigh the risks. Patient understands these risks and consented to starting treatment. Planned treatment duration is 5 years.  Return to clinic in 3 months for toxicity check and follow-up

## 2017-09-12 NOTE — Telephone Encounter (Signed)
Gave avs and calendar for June °

## 2017-09-17 ENCOUNTER — Encounter: Payer: Self-pay | Admitting: Adult Health

## 2017-09-17 DIAGNOSIS — C50412 Malignant neoplasm of upper-outer quadrant of left female breast: Secondary | ICD-10-CM | POA: Insufficient documentation

## 2017-09-17 DIAGNOSIS — Z170 Estrogen receptor positive status [ER+]: Secondary | ICD-10-CM

## 2017-10-30 ENCOUNTER — Ambulatory Visit: Payer: Medicare Other | Attending: General Surgery | Admitting: Physical Therapy

## 2017-10-30 ENCOUNTER — Encounter: Payer: Self-pay | Admitting: Physical Therapy

## 2017-10-30 ENCOUNTER — Other Ambulatory Visit: Payer: Self-pay

## 2017-10-30 DIAGNOSIS — M25612 Stiffness of left shoulder, not elsewhere classified: Secondary | ICD-10-CM | POA: Insufficient documentation

## 2017-10-30 DIAGNOSIS — R293 Abnormal posture: Secondary | ICD-10-CM

## 2017-10-30 DIAGNOSIS — M6281 Muscle weakness (generalized): Secondary | ICD-10-CM | POA: Diagnosis present

## 2017-10-30 DIAGNOSIS — R600 Localized edema: Secondary | ICD-10-CM | POA: Diagnosis present

## 2017-10-30 DIAGNOSIS — M25611 Stiffness of right shoulder, not elsewhere classified: Secondary | ICD-10-CM | POA: Diagnosis not present

## 2017-10-30 NOTE — Therapy (Signed)
[facility] [place], [place], [address] Phone: [phone]   Fax:  [phone]  Physical Therapy Evaluation  Patient Details  Name: Jacqueline [name] MRN: [id number] Date of Birth: April 22, 1935 Referring Provider: Marlou Starks   Encounter Date: 10/30/2017    Past Medical History:  Diagnosis Date  . Arthritis    "was probably in my knees" (09/04/2017)  . Bilateral breast cancer ([place]) 2007, 2012   breast- bilateral  . Breast cancer ([place]) hx 2007   stage T1b grade 2 ER/positive,ductal ca left breast  . Family history of adverse reaction to anesthesia    "granddaughters get PONV, wild, combative" (09/04/2017)  . Family history of breast cancer   . Family history of pancreatic cancer   . GERD (gastroesophageal reflux disease)   . Glaucoma, both eyes   . History of kidney stones   . Hyperlipidemia   . Hypertension   . Migraine    "none in the 2000s" (09/04/2017)  . Osteopenia   . Personal history of chemotherapy   . Personal history of radiation therapy   . PONV (postoperative nausea and vomiting)   . Right rotator cuff tear 08/2012  . Ruptured disc, cervical   . Skin cancer    "cut off face" (09/04/2017)  . Sleep initiation dysfunction     Past Surgical History:  Procedure Laterality Date  . BREAST BIOPSY Bilateral   . BREAST LUMPECTOMY WITH RADIOACTIVE SEED LOCALIZATION Right 06/14/2016   Procedure: RIGHT BREAST LUMPECTOMY WITH RADIOACTIVE SEED LOCALIZATION;  Surgeon: Autumn Messing III, MD;  Location: [facility];  Service: General;  Laterality: Right;  . CATARACT EXTRACTION, BILATERAL Bilateral   . CHOLECYSTECTOMY OPEN    . COLONOSCOPY W/ BIOPSIES AND POLYPECTOMY    . DILATION AND CURETTAGE OF UTERUS    . GLAUCOMA SURGERY Bilateral    "lasered"  . INCISION AND DRAINAGE BREAST ABSCESS Left 05/2006   Developed in the mammosite cavity and treated with I&D, packed and healed   . JOINT REPLACEMENT    .  [place]   "opened me up"  . MASTECTOMY COMPLETE / SIMPLE W/ SENTINEL NODE BIOPSY Bilateral 09/04/2017  . MASTECTOMY PARTIAL / LUMPECTOMY Left 2007   Dr Margot Chimes  . MASTECTOMY PARTIAL / LUMPECTOMY  2012 and 2017   Right Dr Margot Chimes 2012, Dr. Marlou Starks 2017  . MASTECTOMY W/ SENTINEL NODE BIOPSY Bilateral 09/04/2017   Procedure: BILATERAL MASTECTOMIES WITH SENTINEL LYMPH NODE BIOPSY;  Surgeon: Jovita Kussmaul, MD;  Location: [facility];  Service: General;  Laterality: Bilateral;  . PORTACATH PLACEMENT     "it's been removed" (09/04/2017)  . REPLACEMENT TOTAL KNEE BILATERAL  ~ 2007`- ~ 2012   left-right  . SKIN CANCER EXCISION     "face"  . TONSILLECTOMY    . WISDOM TOOTH EXTRACTION      There were no vitals filed for this visit.   Subjective Assessment - 10/30/17 1310    Subjective  I had my drains removed after 7 weeks. The tube from surgery has affected my voice some. I am having some tightness when I raise my arms. I don't need chemo or radiation. When I am sitting in my chair at night sometimes I have numbness and tingling in both hands that does go away when I move them.     Pertinent History  R rotator cuff tear following a fall 4-5 yrs ago, s/p bilateral mastectomy and SLNB on R for T2N0 right breast cancer  that was ER and PR positive and HER 2 negative Ki67 of 5%, and L mastectomy and SLNB for T2N0 for left breast cancer that was ER/PR + and HER 2 negative and Ki67 40% on 09/04/17, hx of L lumpectomy in 2007, hx of R lumpectomy 2013, hx of 2nd lumpectomy on R 05/2017- pt completed 5 days of radiation 2x/day for first two lumpectomies    Patient Stated Goals  get full ROM of bilateral shoulders    Currently in Pain?  No/denies         [facility] PT Assessment - 10/30/17 0001      Assessment   Medical Diagnosis  bilateral breast cancer    Referring Provider  Marlou Starks    Onset Date/Surgical Date  09/04/17    Hand Dominance  Right    Prior Therapy  none      Precautions   Precautions   Knee;Other (comment) bilateral TKA    Precaution Comments  at risk for lymphedema in bilateral UEs      Restrictions   Weight Bearing Restrictions  No      Balance Screen   Has the patient fallen in the past 6 months  No    Has the patient had a decrease in activity level because of a fear of falling?   No    Is the patient reluctant to leave their home because of a fear of falling?   No      Home Environment   Living Environment  Private residence    Living Arrangements  Spouse/significant other    Available Help at Discharge  Family    Type of [place] to enter    Entrance Stairs-Number of Steps  5    Entrance Stairs-Rails  Can reach both    [place]  One level    [place]  None      Prior Function   Level of [place]  Retired    Leisure  pt reports she used to walk but recently has not been pt walks a lot in Program[employer], applications   Overall Cognitive Status  Within Functional Limits for tasks assessed      Observation/Other Assessments   Skin Integrity  bilateral healing mastectomy scars      ROM / Strength   AROM / PROM / Strength  AROM      AROM   Overall AROM   Deficits    AROM Assessment Site  Shoulder    Right/Left Shoulder  Right;Left    Right Shoulder Flexion  135 Degrees    Right Shoulder ABduction  118 Degrees    Right Shoulder Internal Rotation  48 Degrees    Right Shoulder External Rotation  69 Degrees    Left Shoulder Flexion  139 Degrees    Left Shoulder ABduction  130 Degrees    Left Shoulder Internal Rotation  47 Degrees    Left Shoulder External Rotation  59 Degrees        LYMPHEDEMA/ONCOLOGY QUESTIONNAIRE - 10/30/17 1330      Type   Cancer Type  bilateral breast cancer      Surgeries   Mastectomy Date  09/04/17    Lumpectomy Date  -- 2007, 2013, 2018 2 on R and 1 on L    Sentinel Lymph Node Biopsy Date  09/04/17    Number Lymph Nodes Removed  2 bilaterally  Treatment   Active Chemotherapy Treatment  No    Past Chemotherapy Treatment  No    Active Radiation Treatment  No    Past Radiation Treatment  Yes    Current Hormone Treatment  Yes    Drug Name  Femara    Past Hormone Therapy  No      What other symptoms do you have   Are you Having Heaviness or Tightness  Yes    Are you having Pain  No    Are you having pitting edema  No    Is it Hard or Difficult finding clothes that fit  No    Do you have infections  No    Is there Decreased scar mobility  Yes      Lymphedema Assessments   Lymphedema Assessments  Upper extremities      Right Upper Extremity Lymphedema   15 cm Proximal to Olecranon Process  31.2 cm    Olecranon Process  24.2 cm    15 cm Proximal to Ulnar Styloid Process  24 cm    Just Proximal to Ulnar Styloid Process  16.5 cm    Across Hand at [employer]  19.2 cm    At [place] of 2nd Digit  6.8 cm      Left Upper Extremity Lymphedema   15 cm Proximal to Olecranon Process  31 cm    Olecranon Process  25.5 cm    15 cm Proximal to Ulnar Styloid Process  24 cm    Just Proximal to Ulnar Styloid Process  15.5 cm    Across Hand at [employer]  18.2 cm    At [place] of 2nd Digit  6.2 cm          Quick Dash - 10/30/17 0001    Open a tight or new jar  Moderate difficulty    Do heavy household chores (wash walls, wash floors)  Moderate difficulty    Carry a shopping bag or briefcase  No difficulty    Wash your back  Moderate difficulty    Use a knife to cut food  No difficulty    Recreational activities in which you take some force or impact through your arm, shoulder, or hand (golf, hammering, tennis)  Moderate difficulty    During the past week, to what extent has your arm, shoulder or hand problem interfered with your normal social activities with family, friends, neighbors, or groups?  Not at all    During the past week, to what extent has your arm, shoulder or hand problem limited your work or other regular daily  activities  Slightly    Arm, shoulder, or hand pain.  Mild    Tingling (pins and needles) in your arm, shoulder, or hand  Mild    Difficulty Sleeping  No difficulty    DASH Score  25 %        Objective measurements completed on examination: See above findings.              PT Education - 10/30/17 1444    Education provided  Yes    Education Details  anatomy and physiology of lymphatic system, lymphedema risk reduction practices, ABC class    Person(s) Educated  Patient    Methods  Explanation;Handout    Comprehension  Verbalized understanding          PT Long Term Goals - 10/30/17 1442      PT LONG TERM GOAL #1  Title  Pt will be able to independently verbalize lymphedema risk reduction practices    Time  4    Period  Weeks    Status  New    Target Date  11/27/17      PT LONG TERM GOAL #2   Title  Pt will be independent in a home exercise program for continued strengthening and stretching    Time  4    Period  Weeks    Status  New    Target Date  11/27/17      PT LONG TERM GOAL #3   Title  Pt will demonstrate 165 degrees of bilateral shoulder flexion to allow pt to reach items overhead    Baseline  R 135, L 139    Time  4    Period  Weeks    Status  New    Target Date  11/27/17      PT LONG TERM GOAL #4   Title  Pt will demonstrate 165 degrees of bialteral shoulder abduction to allow pt to reach out to sides    Baseline  R 118, L 130    Time  4    Period  Weeks    Status  New    Target Date  11/27/17             Plan - 10/30/17 1435    Clinical Impression Statement  Pt presents to PT today for decreased bilateral shoulder ROM following bilateral mastectomies and SLNBs (2 nodes removed bilaterally) on 09/04/17 for treatment of R and L breast cancer. She has a previous history of 2 lumpectomies on the R and 1 lumpectomy on the L.  Pt's mastectomy scars are healing well with some glue still intact. Her drains were in place for 7 weeks before  being recently removed. She may have a slight amout of edema surrounded mastectomy scars and along lateral trunk. Pt would benefit from skilled PT services to increase bilateral shoulder ROM and strength, decrease pec tightness and progress pt towards independence with a home exercise program.    History and Personal Factors relevant to plan of care:  pt is right handed, hx of 3 previous lumpectomies between R and L sides, hx of R rotator cuff tear w/o surgery 4-5 yrs ago    Clinical Presentation  Stable    Clinical Decision Making  Low    Rehab Potential  Good    Clinical Impairments Affecting Rehab Potential  past hx of radiation    PT Frequency  2x / week    PT Duration  4 weeks    PT Treatment/Interventions  ADLs/Self Care Home Management;Therapeutic exercise;Therapeutic activities;Patient/family education;Scar mobilization;Manual lymph drainage;Manual techniques;Passive range of motion;Taping    PT Next Visit Plan  give dowel exercises, begin gentle AA/A/PROM to bilateral shoulders, do MLD if swelling present    Consulted and Agree with Plan of Care  Patient       Patient will benefit from skilled therapeutic intervention in order to improve the following deficits and impairments:  Increased fascial restricitons, Pain, Postural dysfunction, Decreased scar mobility, Decreased range of motion, Decreased strength, Increased edema, Decreased knowledge of precautions  Visit Diagnosis: Stiffness of right shoulder, not elsewhere classified  Stiffness of left shoulder, not elsewhere classified  Muscle weakness (generalized)  Localized edema  Abnormal posture     Problem List Patient Active Problem List   Diagnosis Date Noted  . Malignant neoplasm of upper-outer quadrant of left breast in female, estrogen  receptor positive ([place]) 09/17/2017  . Genetic testing 07/29/2017  . Malignant neoplasm of upper-outer quadrant of right breast in female, estrogen receptor positive ([place]) 07/22/2017   . Family history of breast cancer   . Family history of pancreatic cancer   . Vitamin D deficiency 07/17/2017  . Memory loss 06/03/2017  . Lobular carcinoma in situ (LCIS) of right breast 07/29/2016  . Physical exam 05/17/2016  . Essential hypertension, benign 09/13/2015  . Incontinence 01/30/2015  . GERD 04/12/2008  . Hyperlipidemia 03/04/2007  . DYSFUNCTION, SLEEP STATE NEC 03/04/2007  . Osteopenia 03/04/2007  . Hx of Breast cancer ([facility]), Left,Stage I Receptor +, Her2 - 04/07/2006    Class: Stage 1    Allyson Sabal Blue 10/30/2017, 2:45 PM  [facility] [place], [place], [address] Phone: [phone]   Fax:  [phone]  Name: Jacqueline [name] MRN: [id number] Date of Birth: 02-Sep-1934  Manus Gunning, PT 10/30/17 2:46 PM

## 2017-11-05 ENCOUNTER — Ambulatory Visit: Payer: Medicare Other | Admitting: Physical Therapy

## 2017-11-06 ENCOUNTER — Encounter: Payer: Self-pay | Admitting: Physical Therapy

## 2017-11-06 ENCOUNTER — Ambulatory Visit: Payer: Medicare Other | Admitting: Physical Therapy

## 2017-11-06 DIAGNOSIS — M25611 Stiffness of right shoulder, not elsewhere classified: Secondary | ICD-10-CM

## 2017-11-06 DIAGNOSIS — M6281 Muscle weakness (generalized): Secondary | ICD-10-CM

## 2017-11-06 DIAGNOSIS — R600 Localized edema: Secondary | ICD-10-CM

## 2017-11-06 DIAGNOSIS — R293 Abnormal posture: Secondary | ICD-10-CM

## 2017-11-06 DIAGNOSIS — M25612 Stiffness of left shoulder, not elsewhere classified: Secondary | ICD-10-CM

## 2017-11-06 NOTE — Therapy (Signed)
[place], [place], [address] Phone: [phone]   Fax:  [phone]  Physical Therapy Treatment  Patient Details  Name: Jacqueline [name] MRN: [id number] Date of Birth: 11/25/1934 Referring Provider: Marlou Starks   Encounter Date: 11/06/2017    Past Medical History:  Diagnosis Date  . Arthritis    "was probably in my knees" (09/04/2017)  . Bilateral breast cancer ([place]) 2007, 2012   breast- bilateral  . Breast cancer ([place]) hx 2007   stage T1b grade 2 ER/positive,ductal ca left breast  . Family history of adverse reaction to anesthesia    "granddaughters get PONV, wild, combative" (09/04/2017)  . Family history of breast cancer   . Family history of pancreatic cancer   . GERD (gastroesophageal reflux disease)   . Glaucoma, both eyes   . History of kidney stones   . Hyperlipidemia   . Hypertension   . Migraine    "none in the 2000s" (09/04/2017)  . Osteopenia   . Personal history of chemotherapy   . Personal history of radiation therapy   . PONV (postoperative nausea and vomiting)   . Right rotator cuff tear 08/2012  . Ruptured disc, cervical   . Skin cancer    "cut off face" (09/04/2017)  . Sleep initiation dysfunction     Past Surgical History:  Procedure Laterality Date  . BREAST BIOPSY Bilateral   . BREAST LUMPECTOMY WITH RADIOACTIVE SEED LOCALIZATION Right 06/14/2016   Procedure: RIGHT BREAST LUMPECTOMY WITH RADIOACTIVE SEED LOCALIZATION;  Surgeon: Autumn Messing III, MD;  Location: [facility] [facility];  Service: General;  Laterality: Right;  . CATARACT EXTRACTION, BILATERAL Bilateral   . CHOLECYSTECTOMY OPEN    . COLONOSCOPY W/ BIOPSIES AND POLYPECTOMY    . DILATION AND CURETTAGE OF UTERUS    . GLAUCOMA SURGERY Bilateral    "lasered"  . INCISION AND DRAINAGE BREAST ABSCESS Left 05/2006   Developed in the mammosite cavity and treated with I&D, packed and healed   . JOINT REPLACEMENT    .  [place]   "opened me up"  . MASTECTOMY COMPLETE / SIMPLE W/ SENTINEL NODE BIOPSY Bilateral 09/04/2017  . MASTECTOMY PARTIAL / LUMPECTOMY Left 2007   Dr Margot Chimes  . MASTECTOMY PARTIAL / LUMPECTOMY  2012 and 2017   Right Dr Margot Chimes 2012, Dr. Marlou Starks 2017  . MASTECTOMY W/ SENTINEL NODE BIOPSY Bilateral 09/04/2017   Procedure: BILATERAL MASTECTOMIES WITH SENTINEL LYMPH NODE BIOPSY;  Surgeon: Jovita Kussmaul, MD;  Location: [facility];  Service: General;  Laterality: Bilateral;  . PORTACATH PLACEMENT     "it's been removed" (09/04/2017)  . REPLACEMENT TOTAL KNEE BILATERAL  ~ 2007`- ~ 2012   left-right  . SKIN CANCER EXCISION     "face"  . TONSILLECTOMY    . WISDOM TOOTH EXTRACTION      There were no vitals filed for this visit.  Subjective Assessment - 11/06/17 1611    Subjective  I'm doing well.  Pt states she is getting her energy back.  She thinks her shoulders are doing better, she still occasional discomfort  in right shoudler from old rotator cuff .  She has 2 open areas on left forearm from doing yardwork that seem to be healing well.  Scaly area and swelling on left lateral leg that she will bring to the attention to Dr. Marlou Starks     Pertinent History  R rotator cuff tear following a fall 4-5 yrs ago, s/p bilateral  mastectomy and SLNB on R for T2N0 right breast cancer that was ER and PR positive and HER 2 negative Ki67 of 5%, and L mastectomy and SLNB for T2N0 for left breast cancer that was ER/PR + and HER 2 negative and Ki67 40% on 09/04/17, hx of L lumpectomy in 2007, hx of R lumpectomy 2013, hx of 2nd lumpectomy on R 05/2017- pt completed 5 days of radiation 2x/day for first two lumpectomies    Patient Stated Goals  get full ROM of bilateral shoulders    Currently in Pain?  No/denies                       [facility] Adult PT Treatment/Exercise - 11/06/17 0001      Self-Care   Self-Care  Other Self-Care Comments    Other Self-Care Comments   reviewed lymphedem risk  reduction practices       Shoulder Exercises: Supine   Protraction  AROM;Right;Left;10 reps    Other Supine Exercises  dowel rod flexion, abduction and external rotation, hand behind head and elbow in and out with each arm     Other Supine Exercises  small circles with each hand pointed to ceiling       Shoulder Exercises: Pulleys   Flexion  2 minutes    ABduction  2 minutes caution for right shoulder which has old rotator cuff injury      Shoulder Exercises: ROM/Strengthening   Wall Wash  -- with folded towel in flexion , abduction and circumduction       Manual Therapy   Manual Therapy  Passive ROM    Passive ROM  to both shoulder in supine to limits.  Pt will less available ROM on right side due to old injury              PT Education - 11/06/17 1721    Education provided  Yes    Education Details  reviewed lymphedema risk reduction practices     Person(s) Educated  Patient    Methods  Explanation    Comprehension  Need further instruction          PT Long Term Goals - 10/30/17 1442      PT LONG TERM GOAL #1   Title  Pt will be able to independently verbalize lymphedema risk reduction practices    Time  4    Period  Weeks    Status  New    Target Date  11/27/17      PT LONG TERM GOAL #2   Title  Pt will be independent in a home exercise program for continued strengthening and stretching    Time  4    Period  Weeks    Status  New    Target Date  11/27/17      PT LONG TERM GOAL #3   Title  Pt will demonstrate 165 degrees of bilateral shoulder flexion to allow pt to reach items overhead    Baseline  R 135, L 139    Time  4    Period  Weeks    Status  New    Target Date  11/27/17      PT LONG TERM GOAL #4   Title  Pt will demonstrate 165 degrees of bialteral shoulder abduction to allow pt to reach out to sides    Baseline  R 118, L 130    Time  4    Period  Weeks  Status  New    Target Date  11/27/17            Plan - 11/06/17 1716     Clinical Impression Statement  Pt is doing extremely well with ROM improvements since intial visit. She was given HEP for ROM today and should be able to progress to strenghthening next visit.  Aslo reviewed lymphedema risk reduction precautions and feel she would benefit from ABC class if she needs more instruction . Feel she will be ready to discharge sooner than previously anticipated       Rehab Potential  Good    Clinical Impairments Affecting Rehab Potential  past hx of radiation    PT Frequency  2x / week    PT Duration  4 weeks    PT Treatment/Interventions  ADLs/Self Care Home Management;Therapeutic exercise;Therapeutic activities;Patient/family education;Scar mobilization;Manual lymph drainage;Manual techniques;Passive range of motion;Taping    PT Next Visit Plan  Reassess for ROM goals and encourage ABC class if needed Add strengthening and discharge when ready     Consulted and Agree with Plan of Care  Patient       Patient will benefit from skilled therapeutic intervention in order to improve the following deficits and impairments:  Increased fascial restricitons, Pain, Postural dysfunction, Decreased scar mobility, Decreased range of motion, Decreased strength, Increased edema, Decreased knowledge of precautions  Visit Diagnosis: Stiffness of right shoulder, not elsewhere classified  Stiffness of left shoulder, not elsewhere classified  Muscle weakness (generalized)  Localized edema  Abnormal posture     Problem List Patient Active Problem List   Diagnosis Date Noted  . Malignant neoplasm of upper-outer quadrant of left breast in female, estrogen receptor positive ([place]) 09/17/2017  . Genetic testing 07/29/2017  . Malignant neoplasm of upper-outer quadrant of right breast in female, estrogen receptor positive ([place]) 07/22/2017  . Family history of breast cancer   . Family history of pancreatic cancer   . Vitamin D deficiency 07/17/2017  . Memory loss 06/03/2017  .  Lobular carcinoma in situ (LCIS) of right breast 07/29/2016  . Physical exam 05/17/2016  . Essential hypertension, benign 09/13/2015  . Incontinence 01/30/2015  . GERD 04/12/2008  . Hyperlipidemia 03/04/2007  . DYSFUNCTION, SLEEP STATE NEC 03/04/2007  . Osteopenia 03/04/2007  . Hx of Breast cancer ([facility]), Left,Stage I Receptor +, Her2 - 04/07/2006    Class: Stage 1   Infant Zink K. Owens Shark PT  Norwood Levo 11/06/2017, 5:23 PM  [place], [place], [address] Phone: [phone]   Fax:  [phone]  Name: Jacqueline [name] MRN: [id number] Date of Birth: 11-12-1934

## 2017-11-06 NOTE — Patient Instructions (Signed)

## 2017-11-07 ENCOUNTER — Ambulatory Visit: Payer: Medicare Other | Admitting: Physical Therapy

## 2017-11-10 ENCOUNTER — Other Ambulatory Visit: Payer: Self-pay | Admitting: Family Medicine

## 2017-11-11 ENCOUNTER — Other Ambulatory Visit: Payer: Self-pay | Admitting: Family Medicine

## 2017-11-11 ENCOUNTER — Other Ambulatory Visit (HOSPITAL_COMMUNITY): Payer: Self-pay | Admitting: General Surgery

## 2017-11-11 DIAGNOSIS — R609 Edema, unspecified: Secondary | ICD-10-CM

## 2017-11-12 ENCOUNTER — Ambulatory Visit (HOSPITAL_COMMUNITY)
Admission: RE | Admit: 2017-11-12 | Discharge: 2017-11-12 | Disposition: A | Discharge status: Home / Self Care | Payer: Medicare Other | Source: Ambulatory Visit | Attending: Family Medicine | Admitting: Family Medicine

## 2017-11-12 ENCOUNTER — Ambulatory Visit: Payer: Medicare Other | Admitting: Physical Therapy

## 2017-11-12 DIAGNOSIS — R609 Edema, unspecified: Secondary | ICD-10-CM | POA: Diagnosis not present

## 2017-11-12 NOTE — Progress Notes (Signed)
Bilateral lower extremity venous duplex has been completed. Negative for DVT. Results were given to Abigail Butts at Dr. Ethlyn Gallery office.  11/12/17 9:29 AM Carlos Levering RVT

## 2017-11-14 ENCOUNTER — Encounter: Payer: Self-pay | Admitting: Physical Therapy

## 2017-11-14 ENCOUNTER — Ambulatory Visit: Payer: Medicare Other | Admitting: Physical Therapy

## 2017-11-14 DIAGNOSIS — M25611 Stiffness of right shoulder, not elsewhere classified: Secondary | ICD-10-CM | POA: Diagnosis not present

## 2017-11-14 DIAGNOSIS — M6281 Muscle weakness (generalized): Secondary | ICD-10-CM

## 2017-11-14 DIAGNOSIS — R293 Abnormal posture: Secondary | ICD-10-CM

## 2017-11-14 DIAGNOSIS — R600 Localized edema: Secondary | ICD-10-CM

## 2017-11-14 DIAGNOSIS — M25612 Stiffness of left shoulder, not elsewhere classified: Secondary | ICD-10-CM

## 2017-11-14 NOTE — Patient Instructions (Signed)
1. Decompression Exercise     Cancer Rehab [phone]    Lie on back on firm surface, knees bent, feet flat, arms turned up, out to sides, backs of hands down. Time _5-15__ minutes. Surface: floor   2. Shoulder Press    Start in Decompression Exercise position. Press shoulders downward towards supporting surface. Hold __2-3__ seconds while counting out loud. Repeat _3-5___ times. Do _1-2___ times per day.   3. Head Press    Bring cervical spine (neck) into neutral position (by either tucking the chin towards the chest or tilting the chin upward). Feel weight on back of head. Press head downward into supporting surface.    Hold _2-3__ seconds. Repeat _3-5__ times. Do _1-2__ times per day.   4. Leg Lengthener    Straighten one leg. Pull toes AND forefoot toward knee, extend heel. Lengthen leg by pulling pelvis away from ribs. Hold _2-3__ seconds. Relax. Repeat __4-6__ times. Do other leg.  Surface: floor   5. Leg Press    Straighten one leg down to floor keeping leg aligned with hip. Pull toes AND forefoot toward knee; extend heel.  Press entire leg downward (as if pressing leg into sandy beach). DO NOT BEND KNEE. Hold _2-3__ seconds. Do __4-6__ times. Repeat with other leg.     Over Head Pull: Narrow and Wide Grip   Cancer Rehab [phone]   On back, knees bent, feet flat, band across thighs, elbows straight but relaxed. Pull hands apart (start). Keeping elbows straight, bring arms up and over head, hands toward floor. Keep pull steady on band. Hold momentarily. Return slowly, keeping pull steady, back to start. Then do same with a wider grip on the band (past shoulder width) Repeat _5-10__ times. Band color __yellow____   Side Pull: Double Arm   On back, knees bent, feet flat. Arms perpendicular to body, shoulder level, elbows straight but relaxed. Pull arms out to sides, elbows straight. Resistance band comes across collarbones, hands toward floor. Hold momentarily. Slowly  return to starting position. Repeat _5-10__ times. Band color _yellow____   Sword   On back, knees bent, feet flat, left hand on left hip, right hand above left. Pull right arm DIAGONALLY (hip to shoulder) across chest. Bring right arm along head toward floor. Hold momentarily. Slowly return to starting position. Repeat _5-10__ times. Do with left arm. Band color _yellow_____   Shoulder Rotation: Double Arm   On back, knees bent, feet flat, elbows tucked at sides, bent 90, hands palms up. Pull hands apart and down toward floor, keeping elbows near sides. Hold momentarily. Slowly return to starting position. Repeat _5-10__ times. Band color __yellow____   

## 2017-11-14 NOTE — Therapy (Signed)
[place], [place], [address] Phone: [phone]   Fax:  [phone]  Physical Therapy Treatment  Patient Details  Name: Jacqueline [name] MRN: [id number] Date of Birth: Jun 13, 1935 Referring Provider: Marlou Starks   Encounter Date: 11/14/2017  PT End of Session - 11/14/17 1211    Visit Number  3    PT Start Time  1017    PT Stop Time  1100    PT Time Calculation (min)  45 min    Activity Tolerance  Patient tolerated treatment well    Behavior During Therapy  [facility] for tasks assessed/performed       Past Medical History:  Diagnosis Date  . Arthritis    "was probably in my knees" (09/04/2017)  . Bilateral breast cancer ([place]) 2007, 2012   breast- bilateral  . Breast cancer ([place]) hx 2007   stage T1b grade 2 ER/positive,ductal ca left breast  . Family history of adverse reaction to anesthesia    "granddaughters get PONV, wild, combative" (09/04/2017)  . Family history of breast cancer   . Family history of pancreatic cancer   . GERD (gastroesophageal reflux disease)   . Glaucoma, both eyes   . History of kidney stones   . Hyperlipidemia   . Hypertension   . Migraine    "none in the 2000s" (09/04/2017)  . Osteopenia   . Personal history of chemotherapy   . Personal history of radiation therapy   . PONV (postoperative nausea and vomiting)   . Right rotator cuff tear 08/2012  . Ruptured disc, cervical   . Skin cancer    "cut off face" (09/04/2017)  . Sleep initiation dysfunction     Past Surgical History:  Procedure Laterality Date  . BREAST BIOPSY Bilateral   . BREAST LUMPECTOMY WITH RADIOACTIVE SEED LOCALIZATION Right 06/14/2016   Procedure: RIGHT BREAST LUMPECTOMY WITH RADIOACTIVE SEED LOCALIZATION;  Surgeon: Autumn Messing III, MD;  Location: [facility];  Service: General;  Laterality: Right;  . CATARACT EXTRACTION, BILATERAL Bilateral   . CHOLECYSTECTOMY OPEN    . COLONOSCOPY W/ BIOPSIES  AND POLYPECTOMY    . DILATION AND CURETTAGE OF UTERUS    . GLAUCOMA SURGERY Bilateral    "lasered"  . INCISION AND DRAINAGE BREAST ABSCESS Left 05/2006   Developed in the mammosite cavity and treated with I&D, packed and healed   . JOINT REPLACEMENT    . [place]   "opened me up"  . MASTECTOMY COMPLETE / SIMPLE W/ SENTINEL NODE BIOPSY Bilateral 09/04/2017  . MASTECTOMY PARTIAL / LUMPECTOMY Left 2007   Dr Margot Chimes  . MASTECTOMY PARTIAL / LUMPECTOMY  2012 and 2017   Right Dr Margot Chimes 2012, Dr. Marlou Starks 2017  . MASTECTOMY W/ SENTINEL NODE BIOPSY Bilateral 09/04/2017   Procedure: BILATERAL MASTECTOMIES WITH SENTINEL LYMPH NODE BIOPSY;  Surgeon: Jovita Kussmaul, MD;  Location: [facility];  Service: General;  Laterality: Bilateral;  . PORTACATH PLACEMENT     "it's been removed" (09/04/2017)  . REPLACEMENT TOTAL KNEE BILATERAL  ~ 2007`- ~ 2012   left-right  . SKIN CANCER EXCISION     "face"  . TONSILLECTOMY    . WISDOM TOOTH EXTRACTION      There were no vitals filed for this visit.  Subjective Assessment - 11/14/17 1208    Subjective  Pt reports she is doing well.  She has seen her dematologist about the swellin she has in her feet and knows how to  control it  She has been walking through [employer] and doing fine  She feels like she undertands her lymphedema risk reduction precautions     Pertinent History  R rotator cuff tear following a fall 4-5 yrs ago, s/p bilateral mastectomy and SLNB on R for T2N0 right breast cancer that was ER and PR positive and HER 2 negative Ki67 of 5%, and L mastectomy and SLNB for T2N0 for left breast cancer that was ER/PR + and HER 2 negative and Ki67 40% on 09/04/17, hx of L lumpectomy in 2007, hx of R lumpectomy 2013, hx of 2nd lumpectomy on R 05/2017- pt completed 5 days of radiation 2x/day for first two lumpectomies    Patient Stated Goals  get full ROM of bilateral shoulders    Currently in Pain?  No/denies         [facility] PT Assessment - 11/14/17  0001      AROM   Right Shoulder Flexion  150 Degrees    Right Shoulder ABduction  140 Degrees old rotator cuff injury on the left     Left Shoulder Flexion  165 Degrees    Left Shoulder ABduction  160 Degrees                   [facility] Adult PT Treatment/Exercise - 11/14/17 0001      Self-Care   Other Self-Care Comments   reviewed lymphedem risk reduction practices  pt states she understands them.  she does not anticipate plane travel and does not need a compression sleeve at this time       Exercises   Exercises  Other Exercises    Other Exercises   standing "heel drops" and benefits of weight bearing exercise and walking to mitigate effects of medications on bone heath       Shoulder Exercises: Supine   Horizontal ABduction  Strengthening;Right;Left;5 reps;Theraband    Theraband Level (Shoulder Horizontal ABduction)  Level 1 (Yellow)    External Rotation  Strengthening;Right;Left;5 reps;Theraband    Flexion  Strengthening;Right;Left;5 reps;Theraband narrow and wide grip     Theraband Level (Shoulder Flexion)  Level 1 (Yellow)    Diagonals  Strengthening;Right;Left;5 reps;Theraband    Theraband Level (Shoulder Diagonals)  Level 1 (Yellow)    Other Supine Exercises  [place] decomprssion exercise              PT Education - 11/14/17 1055    Education provided  Yes    Education Details  lymphedema risk reduction, home exercise program     Person(s) Educated  Patient    Methods  Explanation;Demonstration;Handout    Comprehension  Verbalized understanding          PT Long Term Goals - 11/14/17 1032      PT LONG TERM GOAL #1   Title  Pt will be able to independently verbalize lymphedema risk reduction practices    Status  Achieved      PT LONG TERM GOAL #2   Title  Pt will be independent in a home exercise program for continued strengthening and stretching    Status  Achieved      PT LONG TERM GOAL #3   Title  Pt will demonstrate 165 degrees of bilateral  shoulder flexion to allow pt to reach items overhead    Status  Partially Met      PT LONG TERM GOAL #4   Title  Pt will demonstrate 165 degrees of bialteral shoulder abduction to allow pt to  reach out to sides    Status  Partially Met            Plan - 11/14/17 1058    Clinical Impression Statement  Pt has done exetremely well.  She feels that she can do anything she wants with her arms without difficulty She has not met ROM goals for right shoulder but does not anticipate doing so since she has history of old right rotator cuff injury.  Educated about lymphedema and osteoporosis risk and how exercise can help.  Upgraded home exercise program and pt has not questions about them  Pt feels that she is ready to discharge from PT today     Clinical Impairments Affecting Rehab Potential  past hx of radiation    PT Duration  2 weeks    PT Treatment/Interventions  ADLs/Self Care Home Management;Therapeutic exercise;Therapeutic activities;Patient/family education;Scar mobilization;Manual lymph drainage;Manual techniques;Passive range of motion;Taping    PT Next Visit Plan  discharge        Patient will benefit from skilled therapeutic intervention in order to improve the following deficits and impairments:     Visit Diagnosis: Stiffness of right shoulder, not elsewhere classified  Stiffness of left shoulder, not elsewhere classified  Muscle weakness (generalized)  Localized edema  Abnormal posture     Problem List Patient Active Problem List   Diagnosis Date Noted  . Malignant neoplasm of upper-outer quadrant of left breast in female, estrogen receptor positive ([place]) 09/17/2017  . Genetic testing 07/29/2017  . Malignant neoplasm of upper-outer quadrant of right breast in female, estrogen receptor positive ([place]) 07/22/2017  . Family history of breast cancer   . Family history of pancreatic cancer   . Vitamin D deficiency 07/17/2017  . Memory loss 06/03/2017  . Lobular  carcinoma in situ (LCIS) of right breast 07/29/2016  . Physical exam 05/17/2016  . Essential hypertension, benign 09/13/2015  . Incontinence 01/30/2015  . GERD 04/12/2008  . Hyperlipidemia 03/04/2007  . DYSFUNCTION, SLEEP STATE NEC 03/04/2007  . Osteopenia 03/04/2007  . Hx of Breast cancer ([facility]), Left,Stage I Receptor +, Her2 - 04/07/2006    Class: Stage 1   PHYSICAL THERAPY DISCHARGE SUMMARY  Visits from Start of Care: 3  Current functional level related to goals / functional outcomes: Independent    Remaining deficits: none   Education / Equipment: Lymphedema risk reduction, home exercise  Plan: Patient agrees to discharge.  Patient goals were partially met. Patient is being discharged due to being pleased with the current functional level.  ?????    Donato Heinz. Owens Shark PT  Norwood Levo 11/14/2017, 12:13 PM  [place], [place], [address] Phone: [phone]   Fax:  [phone]  Name: ALMA MOHIUDDIN MRN: [id number] Date of Birth: 07-Apr-1935

## 2017-11-18 ENCOUNTER — Ambulatory Visit: Payer: Medicare Other

## 2017-11-20 ENCOUNTER — Ambulatory Visit: Payer: Medicare Other

## 2017-11-25 ENCOUNTER — Telehealth: Payer: Self-pay

## 2017-11-25 NOTE — Telephone Encounter (Signed)
Returned pt call regarding experiencing side effects to letrozole therapy. She is having severe hot flashes, headaches, etc. Instructed her to stop taking the medication. Take a two week break. After the two weeks she is to call and let us know how she is doing and Dr. Lindi Adie may try a different form of the medication.  Cyndia Bent RN

## 2017-11-26 ENCOUNTER — Encounter: Payer: Medicare Other | Admitting: Physical Therapy

## 2017-11-28 ENCOUNTER — Encounter: Payer: Medicare Other | Admitting: Physical Therapy

## 2017-12-02 ENCOUNTER — Ambulatory Visit: Payer: Medicare Other | Admitting: Family Medicine

## 2017-12-03 ENCOUNTER — Telehealth: Payer: Self-pay

## 2017-12-03 NOTE — Telephone Encounter (Signed)
LVM for patient reminding of SCP visit on 12/12/17 at 10 am.  Left center call back number for patient.

## 2017-12-12 ENCOUNTER — Inpatient Hospital Stay: Payer: Medicare Other | Attending: Genetic Counselor | Admitting: Adult Health

## 2017-12-12 ENCOUNTER — Encounter: Payer: Self-pay | Admitting: Adult Health

## 2017-12-12 ENCOUNTER — Telehealth: Payer: Self-pay | Admitting: Adult Health

## 2017-12-12 VITALS — BP 136/71 | HR 81 | Temp 98.4°F | Resp 18 | Ht 63.0 in | Wt 160.6 lb

## 2017-12-12 DIAGNOSIS — Z170 Estrogen receptor positive status [ER+]: Secondary | ICD-10-CM | POA: Diagnosis not present

## 2017-12-12 DIAGNOSIS — C50411 Malignant neoplasm of upper-outer quadrant of right female breast: Secondary | ICD-10-CM | POA: Diagnosis not present

## 2017-12-12 DIAGNOSIS — Z86000 Personal history of in-situ neoplasm of breast: Secondary | ICD-10-CM | POA: Diagnosis not present

## 2017-12-12 DIAGNOSIS — C50412 Malignant neoplasm of upper-outer quadrant of left female breast: Secondary | ICD-10-CM

## 2017-12-12 DIAGNOSIS — M8580 Other specified disorders of bone density and structure, unspecified site: Secondary | ICD-10-CM | POA: Diagnosis not present

## 2017-12-12 MED ORDER — EXEMESTANE 25 MG PO TABS: 25.0000 mg | ORAL_TABLET | Freq: Every day | ORAL | 5 refills | Status: DC

## 2017-12-12 NOTE — Patient Instructions (Signed)
Bone Health Bones protect organs, store calcium, and anchor muscles. Good health habits, such as eating nutritious foods and exercising regularly, are important for maintaining healthy bones. They can also help to prevent a condition that causes bones to lose density and become weak and brittle (osteoporosis). Why is bone mass important? Bone mass refers to the amount of bone tissue that you have. The higher your bone mass, the stronger your bones. An important step toward having healthy bones throughout life is to have strong and dense bones during childhood. A young adult who has a high bone mass is more likely to have a high bone mass later in life. Bone mass at its greatest it is called peak bone mass. A large decline in bone mass occurs in older adults. In women, it occurs about the time of menopause. During this time, it is important to practice good health habits, because if more bone is lost than what is replaced, the bones will become less healthy and more likely to break (fracture). If you find that you have a low bone mass, you may be able to prevent osteoporosis or further bone loss by changing your diet and lifestyle. How can I find out if my bone mass is low? Bone mass can be measured with an X-ray test that is called a bone mineral density (BMD) test. This test is recommended for all women who are age 65 or older. It may also be recommended for men who are age 70 or older, or for people who are more likely to develop osteoporosis due to:  Having bones that break easily.  Having a long-term disease that weakens bones, such as kidney disease or rheumatoid arthritis.  Having menopause earlier than normal.  Taking medicine that weakens bones, such as steroids, thyroid hormones, or hormone treatment for breast cancer or prostate cancer.  Smoking.  Drinking three or more alcoholic drinks each day.  What are the nutritional recommendations for healthy bones? To have healthy bones, you  need to get enough of the right minerals and vitamins. Most nutrition experts recommend getting these nutrients from the foods that you eat. Nutritional recommendations vary from person to person. Ask your health care provider what is healthy for you. Here are some general guidelines. Calcium Recommendations Calcium is the most important (essential) mineral for bone health. Most people can get enough calcium from their diet, but supplements may be recommended for people who are at risk for osteoporosis. Good sources of calcium include:  Dairy products, such as low-fat or nonfat milk, cheese, and yogurt.  Dark green leafy vegetables, such as bok choy and broccoli.  Calcium-fortified foods, such as orange juice, cereal, bread, soy beverages, and tofu products.  Nuts, such as almonds.  Follow these recommended amounts for daily calcium intake:  Children, age 1?3: 700 mg.  Children, age 4?8: 1,000 mg.  Children, age 9?13: 1,300 mg.  Teens, age 14?18: 1,300 mg.  Adults, age 19?50: 1,000 mg.  Adults, age 51?70: ? Men: 1,000 mg. ? Women: 1,200 mg.  Adults, age 71 or older: 1,200 mg.  Pregnant and breastfeeding females: ? Teens: 1,300 mg. ? Adults: 1,000 mg.  Vitamin D Recommendations Vitamin D is the most essential vitamin for bone health. It helps the body to absorb calcium. Sunlight stimulates the skin to make vitamin D, so be sure to get enough sunlight. If you live in a cold climate or you do not get outside often, your health care provider may recommend that you take vitamin   D supplements. Good sources of vitamin D in your diet include:  Egg yolks.  Saltwater fish.  Milk and cereal fortified with vitamin D.  Follow these recommended amounts for daily vitamin D intake:  Children and teens, age 1?18: 600 international units.  Adults, age 50 or younger: 400-800 international units.  Adults, age 51 or older: 800-1,000 international units.  Other Nutrients Other nutrients  for bone health include:  Phosphorus. This mineral is found in meat, poultry, dairy foods, nuts, and legumes. The recommended daily intake for adult men and adult women is 700 mg.  Magnesium. This mineral is found in seeds, nuts, dark green vegetables, and legumes. The recommended daily intake for adult men is 400?420 mg. For adult women, it is 310?320 mg.  Vitamin K. This vitamin is found in green leafy vegetables. The recommended daily intake is 120 mg for adult men and 90 mg for adult women.  What type of physical activity is best for building and maintaining healthy bones? Weight-bearing and strength-building activities are important for building and maintaining peak bone mass. Weight-bearing activities cause muscles and bones to work against gravity. Strength-building activities increases muscle strength that supports bones. Weight-bearing and muscle-building activities include:  Walking and hiking.  Jogging and running.  Dancing.  Gym exercises.  Lifting weights.  Tennis and racquetball.  Climbing stairs.  Aerobics.  Adults should get at least 30 minutes of moderate physical activity on most days. Children should get at least 60 minutes of moderate physical activity on most days. Ask your health care provide what type of exercise is best for you. Where can I find more information? For more information, check out the following websites:  National Osteoporosis Foundation: http://nof.org/learn/basics  National Institutes of Health: http://www.niams.nih.gov/Health_Info/Bone/Bone_Health/bone_health_for_life.asp  This information is not intended to replace advice given to you by your health care provider. Make sure you discuss any questions you have with your health care provider. Document Released: 09/07/2003 Document Revised: 01/05/2016 Document Reviewed: 06/22/2014 Elsevier Interactive Patient Education  2018 Elsevier Inc.  

## 2017-12-12 NOTE — Progress Notes (Signed)
CLINIC:  Survivorship   REASON FOR VISIT:  Routine follow-up post-treatment for a recent history of breast cancer.  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  Did not take antiestrogen therapy        07/10/2017 Initial Diagnosis    7 mm right breast calcifications, stereotactic biopsy revealed invasive lobular cancer with LCIS ER 95%, PR 2%, Ki-67 5%, HER-2 negative ratio 1.31, T1 BN 0 stage I a clinical stage      08/06/2017 Genetic Testing    Negative genetic testing on the STAT cancer panel.  The STAT Breast cancer panel offered by [facility] includes sequencing and rearrangement analysis for the following 9 genes:  ATM, BRCA1, BRCA2, CDH1, CHEK2, PALB2, PTEN, STK11 and TP53.   The report date is July 28, 2017.  The report was reflexed to the common hereditary cancer panel.  Negative genetic testing on the common hereditary cancer panel was reported out on August 07, 2107.  The Hereditary Gene Panel offered by [facility] includes sequencing and/or deletion duplication testing of the following 47 genes: APC, ATM, AXIN2, BARD1, BMPR1A, BRCA1, BRCA2, BRIP1, CDH1, CDK4, CDKN2A (p14ARF), CDKN2A (p16INK4a), CHEK2, CTNNA1, DICER1, EPCAM (Deletion/duplication testing only), GREM1 (promoter region deletion/duplication testing only), KIT, MEN1, MLH1, MSH2, MSH3, MSH6, MUTYH, NBN, NF1, NHTL1, PALB2, PDGFRA, PMS2, POLD1, POLE, PTEN, RAD50, RAD51C, RAD51D, SDHB, SDHC, SDHD,  SMAD4, SMARCA4. STK11, TP53, TSC1, TSC2, and VHL.  The following genes were evaluated for sequence changes only: SDHA and HOXB13 c.251G>A variant only.        09/04/2017 Surgery    Right mastectomy: Invasive lobular cancer with LCIS, 2.1 cm, grade 1, DCIS, margins negative, 0/3 lymph nodes negative ER 95%, PR 2%, HER-2 negative ratio 1.31, Ki-67 5%, T2N0 stage Ib Left mastectomy: IDC with DCIS 2.3 cm, grade 2, margins negative, 0/3 lymph nodes negative ER 100%, PR 40%, HER-2 negative ratio 1.55, Ki-67 40%, T2N0 stage Ib      08/2017 -  Anti-estrogen oral therapy    Letrozole daily       INTERVAL HISTORY:  Jacqueline [name] presents to the [facility] today for our initial meeting to review her survivorship care plan detailing her treatment course for breast cancer, as well as monitoring long-term side effects of that treatment, education regarding health maintenance, screening, and overall wellness and health promotion.     Overall, Jacqueline [name] reports feeling quite well.  She had a bad reaction to the Letrozole.  She experienced nausea, hot flashes and terrible headaches.  She also felt apathetic while taking it.  She called our office and was told to stop the medication.  Since stopping, she is feeling better.    She has a PCP she sees regularly.  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  She has undergone two knee replacements, and she works in the garden as well.     REVIEW OF SYSTEMS:  Review of Systems  Constitutional: Negative for appetite change, chills, fatigue and fever.  HENT:   Negative for hearing loss, lump/mass and trouble swallowing.   Eyes: Negative for eye problems and icterus.  Respiratory: Negative for chest tightness, cough and shortness of breath.   Cardiovascular: Positive for leg swelling (doppler negative, f/u with PCP on 6/17). Negative for chest pain and palpitations.  Gastrointestinal: Negative for abdominal distention.  Endocrine:  Negative for hot flashes.  Musculoskeletal: Negative for arthralgias.  Skin: Negative for itching and rash.  Neurological: Negative for dizziness, extremity weakness and headaches.  Hematological: Negative for adenopathy. Does not bruise/bleed easily.  Psychiatric/Behavioral: Negative for depression. The patient is not nervous/anxious.         ONCOLOGY TREATMENT TEAM:  1. Surgeon:  Dr. Marlou Starks at [facility] 2. Medical Oncologist: Dr. Lindi Adie      PAST MEDICAL/SURGICAL HISTORY:  Past Medical History:  Diagnosis Date  . Arthritis    "was probably in my knees" (09/04/2017)  . Bilateral breast cancer ([place]) 2007, 2012   breast- bilateral  . Breast cancer ([place]) hx 2007   stage T1b grade 2 ER/positive,ductal ca left breast  . Family history of adverse reaction to anesthesia    "granddaughters get PONV, wild, combative" (09/04/2017)  . Family history of breast cancer   . Family history of pancreatic cancer   . GERD (gastroesophageal reflux disease)   . Glaucoma, both eyes   . History of kidney stones   . Hyperlipidemia   . Hypertension   . Migraine    "none in the 2000s" (09/04/2017)  . Osteopenia   . Personal history of chemotherapy   . Personal history of radiation therapy   . PONV (postoperative nausea and vomiting)   . Right rotator cuff tear 08/2012  . Ruptured disc, cervical   . Skin cancer    "cut off face" (09/04/2017)  . Sleep initiation dysfunction    Past Surgical History:  Procedure Laterality Date  . BREAST BIOPSY Bilateral   . BREAST LUMPECTOMY WITH RADIOACTIVE SEED LOCALIZATION Right 06/14/2016   Procedure: RIGHT BREAST LUMPECTOMY WITH RADIOACTIVE SEED LOCALIZATION;  Surgeon: Autumn Messing III, MD;  Location: [facility];  Service: General;  Laterality: Right;  . CATARACT EXTRACTION, BILATERAL Bilateral   . CHOLECYSTECTOMY OPEN    . COLONOSCOPY W/ BIOPSIES AND POLYPECTOMY    . DILATION AND CURETTAGE OF UTERUS    . GLAUCOMA SURGERY Bilateral     "lasered"  . INCISION AND DRAINAGE BREAST ABSCESS Left 05/2006   Developed in the mammosite cavity and treated with I&D, packed and healed   . JOINT REPLACEMENT    . [place]   "opened me up"  . MASTECTOMY COMPLETE / SIMPLE W/ SENTINEL NODE BIOPSY Bilateral 09/04/2017  . MASTECTOMY PARTIAL / LUMPECTOMY Left 2007   Dr Margot Chimes  . MASTECTOMY PARTIAL / LUMPECTOMY  2012 and 2017   Right Dr Margot Chimes 2012, Dr. Marlou Starks 2017  . MASTECTOMY W/ SENTINEL NODE BIOPSY Bilateral 09/04/2017   Procedure: BILATERAL MASTECTOMIES WITH SENTINEL LYMPH NODE BIOPSY;  Surgeon: Jovita Kussmaul, MD;  Location: [facility];  Service: General;  Laterality: Bilateral;  . PORTACATH PLACEMENT     "it's been removed" (09/04/2017)  . REPLACEMENT TOTAL KNEE BILATERAL  ~ 2007`- ~ 2012   left-right  . SKIN CANCER EXCISION     "face"  . TONSILLECTOMY    . WISDOM TOOTH EXTRACTION       ALLERGIES:  Allergies  Allergen Reactions  .  Aspirin Nausea Only    stomach upset  . Codeine Sulfate Nausea Only    stomach upset  . Lactose Intolerance (Gi) Nausea And Vomiting    Stomach upset      CURRENT MEDICATIONS:  Outpatient Encounter Medications as of 12/12/2017  Medication Sig Note  . acetaminophen (TYLENOL) 500 MG tablet Take 1,000 mg by mouth daily as needed for moderate pain or headache.   Marland Kitchen amLODipine (NORVASC) 5 MG tablet Take 1 tablet (5 mg total) by mouth daily.   . cholecalciferol (VITAMIN D) 1000 units tablet Take 2,000 Units by mouth every evening.   . diclofenac sodium (VOLTAREN) 1 % GEL apply topically once daily as needed for pain   . ibuprofen (ADVIL,MOTRIN) 200 MG tablet Take 400 mg by mouth daily as needed for headache or moderate pain.   Marland Kitchen latanoprost (XALATAN) 0.005 % ophthalmic solution Place 1 drop into both eyes at bedtime.   Marland Kitchen loperamide (IMODIUM) 2 MG capsule Take 3 mg by mouth as needed for diarrhea or loose stools.  08/20/2017: 1.5 tab  . mirtazapine (REMERON) 15 MG tablet Take 1 tablet (15  mg total) at bedtime by mouth. (Patient taking differently: Take 7.5 mg by mouth at bedtime. )   . Multiple Vitamins-Minerals (PRESERVISION AREDS 2) CAPS Take 1 capsule by mouth 2 (two) times daily.   Marland Kitchen nystatin ointment (MYCOSTATIN) Apply 1 application topically every evening.    . olmesartan (BENICAR) 40 MG tablet Take 1 tablet (40 mg total) by mouth daily.   Marland Kitchen omeprazole (PRILOSEC OTC) 20 MG tablet Take 1 tablet (20 mg total) by mouth daily.   . QUEtiapine (SEROQUEL) 25 MG tablet TAKE 1 TABLET BY MOUTH EVERYDAY AT BEDTIME   . TAZORAC 0.05 % cream APPLY TO AFFECTED AREA IN THE EVENING   . triamcinolone ointment (KENALOG) 0.1 % Apply 1 application topically See admin instructions. Use with nystatin on Saturdays and Sundays   . letrozole (FEMARA) 2.5 MG tablet Take 1 tablet (2.5 mg total) by mouth daily. (Patient not taking: Reported on 12/12/2017)   . [DISCONTINUED] cromolyn (NASALCROM) 5.2 MG/ACT nasal spray Place 1 spray into both nostrils daily as needed for allergies.    . [DISCONTINUED] Cyanocobalamin (VITAMIN B-12) 6000 MCG SUBL Place 6,000 mcg under the tongue daily.   . [DISCONTINUED] loratadine (CLARITIN) 10 MG tablet Take 10 mg by mouth daily.   . [DISCONTINUED] Turmeric 400 MG CAPS Take 400 mg by mouth every evening.    No facility-administered encounter medications on file as of 12/12/2017.      ONCOLOGIC FAMILY HISTORY:  Family History  Problem Relation Age of Onset  . Hypertension Mother   . Colon polyps Mother   . Stroke Father   . Cancer Brother        throat  . Pancreatic cancer Other        dx in her 49s  . Breast cancer Cousin        paternal cousin died in her 9s  . Colon cancer Neg Hx   . Stomach cancer Neg Hx      GENETIC COUNSELING/TESTING: See above  SOCIAL HISTORY:  Social History   Socioeconomic History  . Marital status: Married    Spouse name: Not on file  . Number of children: Not on file  . Years of education: Not on file  . Highest  education level: Not on file  Occupational History  . Not on file  Social Needs  . Financial resource strain: Not on  file  . Food insecurity:    Worry: Not on file    Inability: Not on file  . Transportation needs:    Medical: Not on file    Non-medical: Not on file  Tobacco Use  . Smoking status: Never Smoker  . Smokeless tobacco: Never Used  Substance and Sexual Activity  . Alcohol use: No    Alcohol/week: 0.0 oz  . Drug use: No  . Sexual activity: Not on file  Lifestyle  . Physical activity:    Days per week: Not on file    Minutes per session: Not on file  . Stress: Not on file  Relationships  . Social connections:    Talks on phone: Not on file    Gets together: Not on file    Attends religious service: Not on file    Active member of club or organization: Not on file    Attends meetings of clubs or organizations: Not on file    Relationship status: Not on file  . Intimate partner violence:    Fear of current or ex partner: Not on file    Emotionally abused: Not on file    Physically abused: Not on file    Forced sexual activity: Not on file  Other Topics Concern  . Not on file  Social History Narrative  . Not on file      PHYSICAL EXAMINATION:  Vital Signs:   Vitals:   12/12/17 1006  BP: 136/71  Pulse: 81  Resp: 18  Temp: 98.4 F (36.9 C)  SpO2: 100%   Filed Weights   12/12/17 1006  Weight: 160 lb 9.6 oz (72.8 kg)   General: Well-nourished, well-appearing female in no acute distress.  She is unaccompanied today.   HEENT: Head is normocephalic.  Pupils equal and reactive to light. Conjunctivae clear without exudate.  Sclerae anicteric. Oral mucosa is pink, moist.  Oropharynx is pink without lesions or erythema.  Lymph: No cervical, supraclavicular, or infraclavicular lymphadenopathy noted on palpation.  Cardiovascular: Regular rate and rhythm.Marland Kitchen Respiratory: Clear to auscultation bilaterally. Chest expansion symmetric; breathing non-labored.    Breast: s/p bilateral mastectomies without nodules, masses or any sign of recurrence bilaterally.  GI: Abdomen soft and round; non-tender, non-distended. Bowel sounds normoactive.  GU: Deferred.  Neuro: No focal deficits. Steady gait.  Psych: Mood and affect normal and appropriate for situation.  Extremities: No edema. MSK: No focal spinal tenderness to palpation.  Full range of motion in bilateral upper extremities Skin: Warm and dry.  LABORATORY DATA:  None for this visit.  DIAGNOSTIC IMAGING:  None for this visit.      ASSESSMENT AND PLAN:  Ms.. [name] is a pleasant 83 y.o. female with Stage IA bilateral breast cancer, estrogen positive and progesterone positive, diagnosed in 05/2016, treated with bilateral mastectomy, and anti-estrogen therapy with Letrozole beginning in 08/2017.  She presents to the Survivorship Clinic for our initial meeting and routine follow-up post-completion of treatment for breast cancer.    1. Stage IA bilateral breast cancer:  Jacqueline [name] is continuing to recover from definitive treatment for breast cancer. She will follow-up with her medical oncologist, Dr. Lindi Adie in 3 months with history and physical exam per surveillance protocol.  She unfortunately couldn't tolerate the Letrozole.  After thorough discussion, she has opted to try Exemestane.  She will let us know if it is too expensive for her to take.  If it is, then I will send in Anastrozole.  She has been on Tamoxifen  previously, however notes that she was taking it when she was diagnosed with her second breast cancer.    Today, a comprehensive survivorship care plan and treatment summary was reviewed with the patient today detailing her breast cancer diagnosis, treatment course, potential late/long-term effects of treatment, appropriate follow-up care with recommendations for the future, and patient education resources.  A copy of this summary, along with a letter will be sent to the patient's primary  care provider via mail/fax/In Basket message after today's visit.    2. Bone health:  Given Jacqueline [name]'s age/history of breast cancer and her current treatment regimen including anti-estrogen therapy with Letrozole, she is at risk for bone demineralization.  Her last DEXA scan was 07/05/2016, which showed osteopenia with a T score of -1.7 in the right femur.  She will be due for this in 07/2018 with Dr. Birdie Riddle.  In the meantime, she was encouraged to increase her consumption of foods rich in calcium, as well as increase her weight-bearing activities.  She was given education on specific activities to promote bone health.  3. Cancer screening:  Due to Jacqueline [name]'s history and her age, she should receive screening for skin cancers.  The information and recommendations are listed on the patient's comprehensive care plan/treatment summary and were reviewed in detail with the patient.    4. Health maintenance and wellness promotion: Jacqueline [name] was encouraged to consume 5-7 servings of fruits and vegetables per day. We reviewed the "Nutrition Rainbow" handout, as well as the handout "Take Control of Your Health and Reduce Your Cancer Risk" from the [place].  She was also encouraged to engage in moderate to vigorous exercise for 30 minutes per day most days of the week. We discussed the [employer] fitness program, which is designed for cancer survivors to help them become more physically fit after cancer treatments.  She was instructed to limit her alcohol consumption and continue to abstain from tobacco use.     #. Support services/counseling: It is not uncommon for this period of the patient's cancer care trajectory to be one of many emotions and stressors.  We discussed an opportunity for her to participate in the next session of [facility] ("Finding Your New Normal") support group series designed for patients after they have completed treatment.   Jacqueline [name] was encouraged to take advantage  of our many other support services programs, support groups, and/or counseling in coping with her new life as a cancer survivor after completing anti-cancer treatment.    She was given information regarding our available services and encouraged to contact me with any questions or for help enrolling in any of our support group/programs.    Dispo:   -Return to cancer center for f/u with Dr. Lindi Adie in 3 months  -Follow up with Dr Marlou Starks in 6-9 months -Bone density in 07/2018 with Dr. Birdie Riddle -She is welcome to return back to the Survivorship Clinic at any time; no additional follow-up needed at this time.  -Consider referral back to survivorship as a long-term survivor for continued surveillance  A total of (30) minutes of face-to-face time was spent with this patient with greater than 50% of that time in counseling and care-coordination.   Gardenia Phlegm, [place] [phone]   Note: PRIMARY CARE PROVIDER Midge Minium, [place] [phone]

## 2017-12-12 NOTE — Telephone Encounter (Signed)
Gave patient avs and calendar of upcoming September appointments.  °

## 2017-12-15 ENCOUNTER — Other Ambulatory Visit: Payer: Self-pay | Admitting: Family Medicine

## 2017-12-15 ENCOUNTER — Ambulatory Visit: Payer: Medicare Other | Admitting: Family Medicine

## 2017-12-15 ENCOUNTER — Encounter: Payer: Self-pay | Admitting: Family Medicine

## 2017-12-15 ENCOUNTER — Other Ambulatory Visit: Payer: Self-pay

## 2017-12-15 VITALS — BP 120/68 | HR 90 | Temp 98.6°F | Resp 15 | Ht 63.0 in | Wt 160.6 lb

## 2017-12-15 DIAGNOSIS — I10 Essential (primary) hypertension: Secondary | ICD-10-CM | POA: Diagnosis not present

## 2017-12-15 DIAGNOSIS — E785 Hyperlipidemia, unspecified: Secondary | ICD-10-CM | POA: Diagnosis not present

## 2017-12-15 LAB — HEPATIC FUNCTION PANEL
ALT: 19 U/L (ref 0–35)
AST: 15 U/L (ref 0–37)
Albumin: 3.8 g/dL (ref 3.5–5.2)
Alkaline Phosphatase: 92 U/L (ref 39–117)
Bilirubin, Direct: 0.1 mg/dL (ref 0.0–0.3)
Total Bilirubin: 0.4 mg/dL (ref 0.2–1.2)
Total Protein: 6.7 g/dL (ref 6.0–8.3)

## 2017-12-15 LAB — CBC WITH DIFFERENTIAL/PLATELET
Basophils Absolute: 0 10*3/uL (ref 0.0–0.1)
Basophils Relative: 0.6 % (ref 0.0–3.0)
Eosinophils Absolute: 0.1 10*3/uL (ref 0.0–0.7)
Eosinophils Relative: 2.3 % (ref 0.0–5.0)
HCT: 38.2 % (ref 36.0–46.0)
Hemoglobin: 12.9 g/dL (ref 12.0–15.0)
Lymphocytes Relative: 26.8 % (ref 12.0–46.0)
Lymphs Abs: 1.6 10*3/uL (ref 0.7–4.0)
MCHC: 33.7 g/dL (ref 30.0–36.0)
MCV: 89.9 fl (ref 78.0–100.0)
Monocytes Absolute: 0.5 10*3/uL (ref 0.1–1.0)
Monocytes Relative: 7.9 % (ref 3.0–12.0)
Neutro Abs: 3.8 10*3/uL (ref 1.4–7.7)
Neutrophils Relative %: 62.4 % (ref 43.0–77.0)
Platelets: 173 10*3/uL (ref 150.0–400.0)
RBC: 4.25 Mil/uL (ref 3.87–5.11)
RDW: 13.4 % (ref 11.5–15.5)
WBC: 6 10*3/uL (ref 4.0–10.5)

## 2017-12-15 LAB — LIPID PANEL
Cholesterol: 218 mg/dL — ABNORMAL HIGH (ref 0–200)
HDL: 42.1 mg/dL (ref >39.00)
NonHDL: 176.01
Total CHOL/HDL Ratio: 5
Triglycerides: 226 mg/dL — ABNORMAL HIGH (ref 0.0–149.0)
VLDL: 45.2 mg/dL — ABNORMAL HIGH (ref 0.0–40.0)

## 2017-12-15 LAB — BASIC METABOLIC PANEL
BUN: 17 mg/dL (ref 6–23)
CO2: 24 mEq/L (ref 19–32)
Calcium: 9.1 mg/dL (ref 8.4–10.5)
Chloride: 110 mEq/L (ref 96–112)
Creatinine, Ser: 0.96 mg/dL (ref 0.40–1.20)
GFR: 59.29 mL/min — ABNORMAL LOW (ref >60.00)
Glucose, Bld: 121 mg/dL — ABNORMAL HIGH (ref 70–99)
Potassium: 3.6 mEq/L (ref 3.5–5.1)
Sodium: 142 mEq/L (ref 135–145)

## 2017-12-15 LAB — TSH: TSH: 1.81 u[IU]/mL (ref 0.35–4.50)

## 2017-12-15 LAB — LDL CHOLESTEROL, DIRECT: Direct LDL: 132 mg/dL

## 2017-12-15 NOTE — Assessment & Plan Note (Signed)
Chronic problem, well controlled.  Asymptomatic w/ exception of mild swelling of feet bilaterally.  Check labs.  No anticipated med changes.  Will follow.

## 2017-12-15 NOTE — Patient Instructions (Signed)
Schedule your complete physical in 6 months We'll notify you of your lab results and make any changes if needed Continue to work on healthy diet and regular exercise- you look great! Call with any questions or concerns Have a great summer!!  

## 2017-12-15 NOTE — Progress Notes (Signed)
   Subjective:    Patient ID: Jacqueline [name], female    DOB: 30-Sep-1934, 83 y.o.   MRN: [id number]  HPI HTN- chronic problem, on Olmesartan 40mg  and Amlodipine 5mg  w/ good control.  No CP, SOB, HAs, visual changes, edema.  Hyperlipidemia- chronic problem.  Attempting to control w/ diet and exercise.  Pt is very active but no formal exercise.  Pt does quite a bit of yard work, maintains 2 antique booths.  [facility]  No abd pain, N/V.   Review of Systems For ROS see HPI     Objective:   Physical Exam  Constitutional: She is oriented to person, place, and time. She appears well-developed and well-nourished. No distress.  HENT:  Head: Normocephalic and atraumatic.  Eyes: Pupils are equal, round, and reactive to light. Conjunctivae and EOM are normal.  Neck: Normal range of motion. Neck supple. No thyromegaly present.  Cardiovascular: Normal rate, regular rhythm, normal heart sounds and intact distal pulses.  No murmur heard. Pulmonary/Chest: Effort normal and breath sounds normal. No respiratory distress.  Abdominal: Soft. She exhibits no distension. There is no tenderness.  Musculoskeletal: She exhibits edema (trace edema of feet bilaterally).  Lymphadenopathy:    She has no cervical adenopathy.  Neurological: She is alert and oriented to person, place, and time.  Skin: Skin is warm and dry.  Psychiatric: She has a normal mood and affect. Her behavior is normal.  Vitals reviewed.         Assessment & Plan:

## 2017-12-15 NOTE — Assessment & Plan Note (Signed)
Chronic problem.  Attempting to control w/ diet and exercise.  Check labs.  No anticipated need to start meds but will follow.

## 2017-12-16 ENCOUNTER — Other Ambulatory Visit (INDEPENDENT_AMBULATORY_CARE_PROVIDER_SITE_OTHER): Payer: Medicare Other

## 2017-12-16 DIAGNOSIS — R7309 Other abnormal glucose: Secondary | ICD-10-CM | POA: Diagnosis not present

## 2017-12-16 LAB — HEMOGLOBIN A1C: Hgb A1c MFr Bld: 6 % (ref 4.6–6.5)

## 2018-01-19 ENCOUNTER — Ambulatory Visit: Payer: Medicare Other | Admitting: Physician Assistant

## 2018-01-19 ENCOUNTER — Encounter: Payer: Self-pay | Admitting: Physician Assistant

## 2018-01-19 ENCOUNTER — Other Ambulatory Visit: Payer: Self-pay

## 2018-01-19 VITALS — BP 118/64 | HR 81 | Temp 98.1°F | Resp 14 | Ht 63.0 in | Wt 163.0 lb

## 2018-01-19 DIAGNOSIS — B029 Zoster without complications: Secondary | ICD-10-CM | POA: Diagnosis not present

## 2018-01-19 MED ORDER — LIDOCAINE 5 % EX OINT: 1.0000 "application " | TOPICAL_OINTMENT | CUTANEOUS | 0 refills | Status: DC | PRN

## 2018-01-19 MED ORDER — VALACYCLOVIR HCL 1 G PO TABS: 1000.0000 mg | ORAL_TABLET | Freq: Two times a day (BID) | ORAL | 0 refills | Status: DC

## 2018-01-19 NOTE — Progress Notes (Signed)
Patient presents to clinic today c/o painful blistering rash of left lower back, wrapping around to left inguinal region x 5 days. States the rash really enlarged 2 days ago. Denies fever, chills. Denies any drainage from the rash. Denies contact with similar symptoms. Patient with + hx of varicella.   Past Medical History:  Diagnosis Date  . Arthritis    "was probably in my knees" (09/04/2017)  . Bilateral breast cancer ([place]) 2007, 2012   breast- bilateral  . Breast cancer ([place]) hx 2007   stage T1b grade 2 ER/positive,ductal ca left breast  . Family history of adverse reaction to anesthesia    "granddaughters get PONV, wild, combative" (09/04/2017)  . Family history of breast cancer   . Family history of pancreatic cancer   . GERD (gastroesophageal reflux disease)   . Glaucoma, both eyes   . History of kidney stones   . Hyperlipidemia   . Hypertension   . Migraine    "none in the 2000s" (09/04/2017)  . Osteopenia   . Personal history of chemotherapy   . Personal history of radiation therapy   . PONV (postoperative nausea and vomiting)   . Right rotator cuff tear 08/2012  . Ruptured disc, cervical   . Skin cancer    "cut off face" (09/04/2017)  . Sleep initiation dysfunction     Current Outpatient Medications on File Prior to Visit  Medication Sig Dispense Refill  . acetaminophen (TYLENOL) 500 MG tablet Take 1,000 mg by mouth daily as needed for moderate pain or headache.    Marland Kitchen amLODipine (NORVASC) 5 MG tablet TAKE 1 TABLET BY MOUTH EVERY DAY 90 tablet 0  . cholecalciferol (VITAMIN D) 1000 units tablet Take 2,000 Units by mouth every evening.    . diclofenac sodium (VOLTAREN) 1 % GEL apply topically once daily as needed for pain  1  . exemestane (AROMASIN) 25 MG tablet Take 1 tablet (25 mg total) by mouth daily after breakfast. 30 tablet 5  . ibuprofen (ADVIL,MOTRIN) 200 MG tablet Take 400 mg by mouth daily as needed for headache or moderate pain.    Marland Kitchen latanoprost (XALATAN) 0.005 %  ophthalmic solution Place 1 drop into both eyes at bedtime.    Marland Kitchen loperamide (IMODIUM) 2 MG capsule Take 3 mg by mouth as needed for diarrhea or loose stools.     . mirtazapine (REMERON) 15 MG tablet Take 1 tablet (15 mg total) at bedtime by mouth. (Patient taking differently: Take 7.5 mg by mouth at bedtime. ) 90 tablet 1  . Multiple Vitamins-Minerals (PRESERVISION AREDS 2) CAPS Take 1 capsule by mouth 2 (two) times daily.    Marland Kitchen nystatin ointment (MYCOSTATIN) Apply 1 application topically every evening.     . olmesartan (BENICAR) 40 MG tablet Take 1 tablet (40 mg total) by mouth daily. 90 tablet 1  . omeprazole (PRILOSEC OTC) 20 MG tablet Take 1 tablet (20 mg total) by mouth daily. 90 tablet 4  . QUEtiapine (SEROQUEL) 25 MG tablet TAKE 1 TABLET BY MOUTH EVERYDAY AT BEDTIME 90 tablet 0  . TAZORAC 0.05 % cream APPLY TO AFFECTED AREA IN THE EVENING  1  . triamcinolone ointment (KENALOG) 0.1 % Apply 1 application topically See admin instructions. Use with nystatin on Saturdays and Sundays     No current facility-administered medications on file prior to visit.     Allergies  Allergen Reactions  . Aspirin Nausea Only    stomach upset  . Codeine Sulfate Nausea Only  stomach upset  . Lactose Intolerance (Gi) Nausea And Vomiting    Stomach upset     Family History  Problem Relation Age of Onset  . Hypertension Mother   . Colon polyps Mother   . Stroke Father   . Cancer Brother        throat  . Pancreatic cancer Other        dx in her 46s  . Breast cancer Cousin        paternal cousin died in her 37s  . Colon cancer Neg Hx   . Stomach cancer Neg Hx     Social History   Socioeconomic History  . Marital status: Married    Spouse name: Not on file  . Number of children: Not on file  . Years of education: Not on file  . Highest education level: Not on file  Occupational History  . Not on file  Social Needs  . Financial resource strain: Not on file  . Food insecurity:    Worry:  Not on file    Inability: Not on file  . Transportation needs:    Medical: Not on file    Non-medical: Not on file  Tobacco Use  . Smoking status: Never Smoker  . Smokeless tobacco: Never Used  Substance and Sexual Activity  . Alcohol use: No    Alcohol/week: 0.0 oz  . Drug use: No  . Sexual activity: Not on file  Lifestyle  . Physical activity:    Days per week: Not on file    Minutes per session: Not on file  . Stress: Not on file  Relationships  . Social connections:    Talks on phone: Not on file    Gets together: Not on file    Attends religious service: Not on file    Active member of club or organization: Not on file    Attends meetings of clubs or organizations: Not on file    Relationship status: Not on file  Other Topics Concern  . Not on file  Social History Narrative  . Not on file    Review of Systems - See HPI.  All other ROS are negative.  BP 118/64   Pulse 81   Temp 98.1 F (36.7 C) (Oral)   Resp 14   Ht 5\' 3"  (1.6 m)   Wt 163 lb (73.9 kg)   SpO2 98%   BMI 28.87 kg/m   Physical Exam  Constitutional: She is oriented to person, place, and time. She appears well-developed and well-nourished.  HENT:  Head: Normocephalic and atraumatic.  Eyes: Conjunctivae are normal.  Cardiovascular: Normal rate, regular rhythm and normal heart sounds.  Pulmonary/Chest: Effort normal.  Neurological: She is alert and oriented to person, place, and time.  Skin:     Vitals reviewed.   Recent Results (from the past 2160 hour(s))  Lipid panel     Status: Abnormal   Collection Time: 12/15/17 10:02 AM  Result Value Ref Range   Cholesterol 218 (H) 0 - 200 mg/dL    Comment: ATP III Classification       Desirable:  < 200 mg/dL               Borderline High:  200 - 239 mg/dL          High:  > = 240 mg/dL   Triglycerides 226.0 (H) 0.0 - 149.0 mg/dL    Comment: Normal:  <150 mg/dLBorderline High:  150 - 199 mg/dL  HDL 42.10 >39.00 mg/dL   VLDL 45.2 (H) 0.0 - 40.0  mg/dL   Total CHOL/HDL Ratio 5     Comment:                Men          Women1/2 Average Risk     3.4          3.3Average Risk          5.0          4.42X Average Risk          9.6          7.13X Average Risk          15.0          11.0                       NonHDL 176.01     Comment: NOTE:  Non-HDL goal should be 30 mg/dL higher than patient's LDL goal (i.e. LDL goal of < 70 mg/dL, would have non-HDL goal of < 100 mg/dL)  Basic metabolic panel     Status: Abnormal   Collection Time: 12/15/17 10:02 AM  Result Value Ref Range   Sodium 142 135 - 145 mEq/L   Potassium 3.6 3.5 - 5.1 mEq/L   Chloride 110 96 - 112 mEq/L   CO2 24 19 - 32 mEq/L   Glucose, Bld 121 (H) 70 - 99 mg/dL   BUN 17 6 - 23 mg/dL   Creatinine, Ser 0.96 0.40 - 1.20 mg/dL   Calcium 9.1 8.4 - 10.5 mg/dL   GFR 59.29 (L) >60.00 mL/min  TSH     Status: None   Collection Time: 12/15/17 10:02 AM  Result Value Ref Range   TSH 1.81 0.35 - 4.50 uIU/mL  Hepatic function panel     Status: None   Collection Time: 12/15/17 10:02 AM  Result Value Ref Range   Total Bilirubin 0.4 0.2 - 1.2 mg/dL   Bilirubin, Direct 0.1 0.0 - 0.3 mg/dL   Alkaline Phosphatase 92 39 - 117 U/L   AST 15 0 - 37 U/L   ALT 19 0 - 35 U/L   Total Protein 6.7 6.0 - 8.3 g/dL   Albumin 3.8 3.5 - 5.2 g/dL  CBC with Differential/Platelet     Status: None   Collection Time: 12/15/17 10:02 AM  Result Value Ref Range   WBC 6.0 4.0 - 10.5 K/uL   RBC 4.25 3.87 - 5.11 Mil/uL   Hemoglobin 12.9 12.0 - 15.0 g/dL   HCT 38.2 36.0 - 46.0 %   MCV 89.9 78.0 - 100.0 fl   MCHC 33.7 30.0 - 36.0 g/dL   RDW 13.4 11.5 - 15.5 %   Platelets 173.0 150.0 - 400.0 K/uL   Neutrophils Relative % 62.4 43.0 - 77.0 %   Lymphocytes Relative 26.8 12.0 - 46.0 %   Monocytes Relative 7.9 3.0 - 12.0 %   Eosinophils Relative 2.3 0.0 - 5.0 %   Basophils Relative 0.6 0.0 - 3.0 %   Neutro Abs 3.8 1.4 - 7.7 K/uL   Lymphs Abs 1.6 0.7 - 4.0 K/uL   Monocytes Absolute 0.5 0.1 - 1.0 K/uL    Eosinophils Absolute 0.1 0.0 - 0.7 K/uL   Basophils Absolute 0.0 0.0 - 0.1 K/uL  LDL cholesterol, direct     Status: None   Collection Time: 12/15/17 10:02 AM  Result Value Ref Range   Direct LDL  132.0 mg/dL    Comment: Optimal:  <100 mg/dLNear or Above Optimal:  100-129 mg/dLBorderline High:  130-159 mg/dLHigh:  160-189 mg/dLVery High:  >190 mg/dL  Hemoglobin A1c     Status: None   Collection Time: 12/16/17  9:11 AM  Result Value Ref Range   Hgb A1c MFr Bld 6.0 4.6 - 6.5 %    Comment: Glycemic Control Guidelines for People with Diabetes:Non Diabetic:  <6%Goal of Therapy: <7%Additional Action Suggested:  >8%     Assessment/Plan: 1. Herpes zoster without complication Start Valtrex 1000 mg TID x 7 days. Topical Lidocaine to be applied to the area. Supportive measures and OTC medications reviewed. Discussed avoidance of the very young, elderly and pregnant women. Follow-up discussed.  - valACYclovir (VALTREX) 1000 MG tablet; Take 1 tablet (1,000 mg total) by mouth 2 (two) times daily.  Dispense: 21 tablet; Refill: 0    Leeanne Rio, PA-C

## 2018-01-19 NOTE — Patient Instructions (Addendum)
Please take the antiviral medication as directed. Take with food. Keep the rash covered. You can apply the lidocaine ointment to the area to help numb the overactive superficial nerves.   Avoid contact with elderly, infants and pregnant women. This is no longer contagious once the blisters scab over.  Continue your over the counter pain medications.

## 2018-01-20 ENCOUNTER — Telehealth: Payer: Self-pay | Admitting: Family Medicine

## 2018-01-20 DIAGNOSIS — R110 Nausea: Secondary | ICD-10-CM

## 2018-01-20 DIAGNOSIS — B029 Zoster without complications: Secondary | ICD-10-CM

## 2018-01-20 MED ORDER — GABAPENTIN 100 MG PO CAPS: 100.0000 mg | ORAL_CAPSULE | Freq: Two times a day (BID) | ORAL | 1 refills | Status: DC

## 2018-01-20 MED ORDER — ONDANSETRON HCL 4 MG PO TABS: 4.0000 mg | ORAL_TABLET | Freq: Three times a day (TID) | ORAL | 0 refills | Status: DC | PRN

## 2018-01-20 NOTE — Telephone Encounter (Signed)
Spoke with patient about her pain level. She states she having a lot of discomfort today. She is using the Lidocaine 4 % ointment and its not helping much.  She states the Valtrex is causing nausea and diarrhea. She is taking with food/meals.  She believes she has taken Hydrocodone before and it caused nausea and vomiting. Will add to her allergy list.   She is agreeable with starting Gabapentin and Zofran for nausea. Please advise of dose of dosage.

## 2018-01-20 NOTE — Telephone Encounter (Signed)
The Prior Jacqueline [name] was approved so she should be able to get the prescription strength now. See how that works. We can also potentially start her on a low-dose Hydrocodone as I see she has taken before. See if she is agreeable and I will send in Rx.

## 2018-01-20 NOTE — Telephone Encounter (Signed)
Copied from [place] [id number]. Topic: Quick Communication - See Telephone Encounter >> Jan 20, 2018 10:33 AM Bea Graff, NT wrote: CRM for notification. See Telephone encounter for: 01/20/18. Pt states she was seen yesterday and the lidocaine (XYLOCAINE) 5 % ointment needed PA and Elyn Aquas stated for her to try the OTC cream and she said she is not getting enough relief. She wants to know if something else can be taken as well and to check status on the PA.

## 2018-01-20 NOTE — Telephone Encounter (Signed)
Left detailed message on VM advising patient prior authorization was approved. Wanted to know how pain level is doing with OTC topical ointment.

## 2018-02-17 ENCOUNTER — Other Ambulatory Visit: Payer: Self-pay | Admitting: General Practice

## 2018-02-17 MED ORDER — MIRTAZAPINE 15 MG PO TABS: 15.0000 mg | ORAL_TABLET | Freq: Every day | ORAL | 1 refills | Status: DC

## 2018-02-27 ENCOUNTER — Other Ambulatory Visit: Payer: Self-pay | Admitting: Family Medicine

## 2018-03-05 ENCOUNTER — Other Ambulatory Visit: Payer: Self-pay | Admitting: Family Medicine

## 2018-03-11 ENCOUNTER — Telehealth: Payer: Self-pay | Admitting: Hematology and Oncology

## 2018-03-11 NOTE — Telephone Encounter (Signed)
Patient called to reschedule  °

## 2018-03-12 ENCOUNTER — Ambulatory Visit: Payer: Medicare Other | Admitting: Hematology and Oncology

## 2018-03-23 ENCOUNTER — Ambulatory Visit: Payer: Medicare Other | Admitting: Hematology and Oncology

## 2018-03-25 ENCOUNTER — Inpatient Hospital Stay: Payer: Medicare Other | Attending: Hematology and Oncology | Admitting: Hematology and Oncology

## 2018-03-25 ENCOUNTER — Telehealth: Payer: Self-pay | Admitting: Hematology and Oncology

## 2018-03-25 DIAGNOSIS — Z170 Estrogen receptor positive status [ER+]: Secondary | ICD-10-CM

## 2018-03-25 DIAGNOSIS — C50411 Malignant neoplasm of upper-outer quadrant of right female breast: Secondary | ICD-10-CM | POA: Diagnosis present

## 2018-03-25 DIAGNOSIS — N951 Menopausal and female climacteric states: Secondary | ICD-10-CM | POA: Insufficient documentation

## 2018-03-25 DIAGNOSIS — Z9013 Acquired absence of bilateral breasts and nipples: Secondary | ICD-10-CM | POA: Insufficient documentation

## 2018-03-25 DIAGNOSIS — F329 Major depressive disorder, single episode, unspecified: Secondary | ICD-10-CM | POA: Insufficient documentation

## 2018-03-25 DIAGNOSIS — Z853 Personal history of malignant neoplasm of breast: Secondary | ICD-10-CM | POA: Diagnosis not present

## 2018-03-25 MED ORDER — ANASTROZOLE 1 MG PO TABS: 1.0000 mg | ORAL_TABLET | Freq: Every day | ORAL | 0 refills | Status: DC

## 2018-03-25 NOTE — Assessment & Plan Note (Signed)
Bilateral mastectomies 09/04/2017: Right mastectomy: Invasive lobular cancer with LCIS, 2.1 cm, grade 1, DCIS, margins negative, 0/3 lymph nodes negative ER 95%, PR 2%, HER-2 negative ratio 1.31, Ki-67 5%, T2N0 stage Ib Left mastectomy: IDC with DCIS 2.3 cm, grade 2, margins negative, 0/3 lymph nodes negative ER 100%, PR 40%, HER-2 negative ratio 1.55, Ki-67 40%, T2N0 stage Ib  Recommendation: Antiestrogen therapy with letrozole 2.5 mg daily  Letrozole toxicities:  Breast cancer surveillance: 1.  Breast exam: 12/12/2017: Benign 2. mammogram: No role of mammogram since she had bilateral mastectomies  Return to clinic in 6 months for follow-up and after that we can see her once a year

## 2018-03-25 NOTE — Progress Notes (Signed)
Patient Care Team: Midge Minium, MD as PCP - General (Family Medicine) Luberta Mutter, MD as Consulting Physician (Ophthalmology) Kristeen Miss, MD as Consulting Physician (Neurosurgery) Elsie Saas, MD as Consulting Physician (Orthopedic Surgery) Jari Pigg, MD as Consulting Physician (Dermatology) Jovita Kussmaul, MD as Consulting Physician (General Surgery) Nicholas Lose, MD as Consulting Physician (Hematology and Oncology) Delice Bison Charlestine Massed, NP as Nurse Practitioner (Hematology and Oncology)  DIAGNOSIS:  Encounter Diagnosis  Name Primary?  . Malignant neoplasm of upper-outer quadrant of right breast in female, estrogen receptor positive ([place])     SUMMARY OF ONCOLOGIC HISTORY:   Malignant neoplasm of upper-outer quadrant of right breast in female, estrogen receptor positive ([place])   2007 Initial Biopsy    Stage I left breast cancer treated with lumpectomy and radiation and 5 years of tamoxifen    2012 Relapse/Recurrence    Right breast DCIS treated with lumpectomy and radiation, did not take antiestrogen therapy    06/14/2016 Surgery    Right lumpectomy 06/14/2016: LCIS with necrosis 3 cm, broadly involves the posterior margin, additional deep margin LCIS with necrosis 1.5 cm focally 0.1 cm from posterior margin.  Did not take antiestrogen therapy      07/10/2017 Initial Diagnosis    7 mm right breast calcifications, stereotactic biopsy revealed invasive lobular cancer with LCIS ER 95%, PR 2%, Ki-67 5%, HER-2 negative ratio 1.31, T1 BN 0 stage I a clinical stage    08/06/2017 Genetic Testing    Negative genetic testing on the STAT cancer panel.  The STAT Breast cancer panel offered by [facility] includes sequencing and rearrangement analysis for the following 9 genes:  ATM, BRCA1, BRCA2, CDH1, CHEK2, PALB2, PTEN, STK11 and TP53.   The report date is July 28, 2017.  The report was reflexed to the common hereditary cancer panel.  Negative genetic testing on the  common hereditary cancer panel was reported out on August 07, 2107.  The Hereditary Gene Panel offered by [facility] includes sequencing and/or deletion duplication testing of the following 47 genes: APC, ATM, AXIN2, BARD1, BMPR1A, BRCA1, BRCA2, BRIP1, CDH1, CDK4, CDKN2A (p14ARF), CDKN2A (p16INK4a), CHEK2, CTNNA1, DICER1, EPCAM (Deletion/duplication testing only), GREM1 (promoter region deletion/duplication testing only), KIT, MEN1, MLH1, MSH2, MSH3, MSH6, MUTYH, NBN, NF1, NHTL1, PALB2, PDGFRA, PMS2, POLD1, POLE, PTEN, RAD50, RAD51C, RAD51D, SDHB, SDHC, SDHD, SMAD4, SMARCA4. STK11, TP53, TSC1, TSC2, and VHL.  The following genes were evaluated for sequence changes only: SDHA and HOXB13 c.251G>A variant only.      09/04/2017 Surgery    Right mastectomy: Invasive lobular cancer with LCIS, 2.1 cm, grade 1, DCIS, margins negative, 0/3 lymph nodes negative ER 95%, PR 2%, HER-2 negative ratio 1.31, Ki-67 5%, T2N0 stage Ib Left mastectomy: IDC with DCIS 2.3 cm, grade 2, margins negative, 0/3 lymph nodes negative ER 100%, PR 40%, HER-2 negative ratio 1.55, Ki-67 40%, T2N0 stage Ib    08/2017 -  Anti-estrogen oral therapy    Letrozole switched to Aromasin and then switched to anastrozole 03/25/2018 due to hot flashes and depression     CHIEF COMPLIANT: Inability to tolerate Aromasin  INTERVAL HISTORY: Jacqueline [name] is a 83 year old with above-mentioned history of bilateral mastectomies for bilateral breast cancers who was on antiestrogen therapy initially with letrozole then it was switched to exemestane and she is still not able to tolerate it.  She complains of hot flashes as well as depression.  She does not have any arthralgias or myalgias.  Depression is so significant that she is not  able to enjoy her life.  Once she stops her medicine her depression goes away.  REVIEW OF SYSTEMS:   Constitutional: Denies fevers, chills or abnormal weight loss Eyes: Denies blurriness of vision Ears, nose, mouth,  throat, and face: Denies mucositis or sore throat Respiratory: Denies cough, dyspnea or wheezes Cardiovascular: Denies palpitation, chest discomfort Gastrointestinal:  Denies nausea, heartburn or change in bowel habits Skin: Denies abnormal skin rashes Lymphatics: Denies new lymphadenopathy or easy bruising Neurological:Denies numbness, tingling or new weaknesses Behavioral/Psych: Mood is stable, no new changes  Extremities: No lower extremity edema Breast: Bilateral mastectomies All other systems were reviewed with the patient and are negative.  I have reviewed the past medical history, past surgical history, social history and family history with the patient and they are unchanged from previous note.  ALLERGIES:  is allergic to aspirin; codeine sulfate; hydrocodone-acetaminophen; and lactose intolerance (gi).  MEDICATIONS:  Current Outpatient Medications  Medication Sig Dispense Refill  . acetaminophen (TYLENOL) 500 MG tablet Take 1,000 mg by mouth daily as needed for moderate pain or headache.    Marland Kitchen amLODipine (NORVASC) 5 MG tablet TAKE 1 TABLET BY MOUTH EVERY DAY 90 tablet 0  . anastrozole (ARIMIDEX) 1 MG tablet Take 1 tablet (1 mg total) by mouth daily. 30 tablet 0  . cholecalciferol (VITAMIN D) 1000 units tablet Take 2,000 Units by mouth every evening.    . diclofenac sodium (VOLTAREN) 1 % GEL apply topically once daily as needed for pain  1  . ibuprofen (ADVIL,MOTRIN) 200 MG tablet Take 400 mg by mouth daily as needed for headache or moderate pain.    Marland Kitchen latanoprost (XALATAN) 0.005 % ophthalmic solution Place 1 drop into both eyes at bedtime.    Marland Kitchen loperamide (IMODIUM) 2 MG capsule Take 3 mg by mouth as needed for diarrhea or loose stools.     . mirtazapine (REMERON) 15 MG tablet Take 1 tablet (15 mg total) by mouth at bedtime. 90 tablet 1  . Multiple Vitamins-Minerals (PRESERVISION AREDS 2) CAPS Take 1 capsule by mouth 2 (two) times daily.    Marland Kitchen nystatin ointment (MYCOSTATIN) Apply 1  application topically every evening.     . olmesartan (BENICAR) 40 MG tablet TAKE 1 TABLET BY MOUTH EVERY DAY 90 tablet 1  . omeprazole (PRILOSEC OTC) 20 MG tablet Take 1 tablet (20 mg total) by mouth daily. 90 tablet 4  . QUEtiapine (SEROQUEL) 25 MG tablet TAKE 1 TABLET BY MOUTH EVERYDAY AT BEDTIME 90 tablet 0  . TAZORAC 0.05 % cream APPLY TO AFFECTED AREA IN THE EVENING  1   No current facility-administered medications for this visit.     PHYSICAL EXAMINATION: ECOG PERFORMANCE STATUS: 1 - Symptomatic but completely ambulatory  Vitals:   03/25/18 1353  BP: (!) 141/74  Pulse: 77  Resp: 16  Temp: 98 F (36.7 C)  SpO2: 99%   Filed Weights   03/25/18 1353  Weight: 159 lb 12.8 oz (72.5 kg)    GENERAL:alert, no distress and comfortable SKIN: skin color, texture, turgor are normal, no rashes or significant lesions EYES: normal, Conjunctiva are pink and non-injected, sclera clear OROPHARYNX:no exudate, no erythema and lips, buccal mucosa, and tongue normal  NECK: supple, thyroid normal size, non-tender, without nodularity LYMPH:  no palpable lymphadenopathy in the cervical, axillary or inguinal LUNGS: clear to auscultation and percussion with normal breathing effort HEART: regular rate & rhythm and no murmurs and no lower extremity edema ABDOMEN:abdomen soft, non-tender and normal bowel sounds MUSCULOSKELETAL:no cyanosis of  digits and no clubbing  NEURO: alert & oriented x 3 with fluent speech, no focal motor/sensory deficits EXTREMITIES: No lower extremity edema   LABORATORY DATA:  I have reviewed the data as listed CMP Latest Ref Rng & Units 12/15/2017 08/27/2017 07/07/2017  Glucose 70 - 99 mg/dL 121(H) 106(H) 88  BUN 6 - 23 mg/dL _0 Creatinine 0.40 - 1.20 mg/dL 0.96 0.96 0.98  Sodium 135 - 145 mEq/L 142 141 142  Potassium 3.5 - 5.1 mEq/L 3.6 3.7 3.9  Chloride 96 - 112 mEq/L 110 110 108  CO2 19 - 32 mEq/L _1 Calcium 8.4 - 10.5 mg/dL 9.1 9.4 9.3  Total Protein  6.0 - 8.3 g/dL 6.7 - -  Total Bilirubin 0.2 - 1.2 mg/dL 0.4 - -  Alkaline Phos 39 - 117 U/L 92 - -  AST 0 - 37 U/L 15 - -  ALT 0 - 35 U/L 19 - -    Lab Results  Component Value Date   WBC 6.0 12/15/2017   HGB 12.9 12/15/2017   HCT 38.2 12/15/2017   MCV 89.9 12/15/2017   PLT 173.0 12/15/2017   NEUTROABS 3.8 12/15/2017    ASSESSMENT & PLAN:  Malignant neoplasm of upper-outer quadrant of right breast in female, estrogen receptor positive ([place]) Bilateral mastectomies 09/04/2017: Right mastectomy: Invasive lobular cancer with LCIS, 2.1 cm, grade 1, DCIS, margins negative, 0/3 lymph nodes negative ER 95%, PR 2%, HER-2 negative ratio 1.31, Ki-67 5%, T2N0 stage Ib Left mastectomy: IDC with DCIS 2.3 cm, grade 2, margins negative, 0/3 lymph nodes negative ER 100%, PR 40%, HER-2 negative ratio 1.55, Ki-67 40%, T2N0 stage Ib  Recommendation: Antiestrogen therapy with letrozole 2.5 mg daily, switched to exemestane June 2019 and switched to anastrozole September 2019 due to hot flashes and memory/depression problems  Letrozole toxicities: Hot flashes and depression I recommended switching her to anastrozole therapy.  She will start at half a tablet daily and then increase it to 1 full tablet daily.  If she cannot tolerate this then we will switch her to tamoxifen.  Breast cancer surveillance: 1.  Breast exam: 12/12/2017: Benign 2. mammogram: No role of mammogram since she had bilateral mastectomies  Return to clinic in 6 months for follow-up and after that we can see her once a year    No orders of the defined types were placed in this encounter.  The patient has a good understanding of the overall plan. she agrees with it. she will call with any problems that may develop before the next visit here.   Harriette Ohara, MD 03/25/18

## 2018-03-25 NOTE — Telephone Encounter (Signed)
Gave patient avs report and appointments for March  °

## 2018-04-12 ENCOUNTER — Other Ambulatory Visit: Payer: Self-pay | Admitting: Family Medicine

## 2018-04-22 ENCOUNTER — Other Ambulatory Visit: Payer: Self-pay | Admitting: Hematology and Oncology

## 2018-04-27 ENCOUNTER — Ambulatory Visit: Payer: Self-pay

## 2018-04-27 ENCOUNTER — Encounter: Payer: Self-pay | Admitting: Family Medicine

## 2018-04-27 ENCOUNTER — Ambulatory Visit: Payer: Medicare Other | Admitting: Family Medicine

## 2018-04-27 ENCOUNTER — Other Ambulatory Visit: Payer: Self-pay

## 2018-04-27 VITALS — BP 152/80 | HR 82 | Temp 98.1°F | Resp 16 | Ht 63.0 in | Wt 162.5 lb

## 2018-04-27 DIAGNOSIS — I10 Essential (primary) hypertension: Secondary | ICD-10-CM | POA: Diagnosis not present

## 2018-04-27 NOTE — Assessment & Plan Note (Signed)
Deteriorated.  Pt's BP is much higher than previous but she remembers that she changed her diet and started binging on junk food around the same time she developed the headaches and pressure in her head.  Reviewed need for low sodium diet, increased water intake, and limited caffeine intake.  Since some of her BP readings are WNL, I am hesitant to change medications at this time.  Will follow closely at future visits.  Pt expressed understanding and is in agreement w/ plan.

## 2018-04-27 NOTE — Patient Instructions (Signed)
Follow up in 3-4 weeks to recheck BP LIMIT your salt and caffeine intake INCREASE your water intake IF you have headache or pressure, you can take an extra 1/2 tab of Amlodipine to lower BP Call with any questions or concerns Hang in there!!!

## 2018-04-27 NOTE — Progress Notes (Signed)
   Subjective:    Patient ID: IO DIEUJUSTE, female    DOB: [birth year], 83 y.o.   MRN: [id number]  HPI HTN- BP has been running high for the last few weeks.  Reports she has been 'binging' on cheese curls, caffeine, grapes, and snickers.  On Amlodipine 5mg  and Olmesartan 40mg  daily.  Has been waking up w/ 'tightness and throbbing in my head'.  Started taking BP last week and BP was elevated at 130-165/60-70s.  Pt reports her food binges corresponds w/ her HAs and elevated BP.  Pt admits poor water intake.  No CP, SOB, visual changes, edema.   Review of Systems For ROS see HPI     Objective:   Physical Exam  Constitutional: She is oriented to person, place, and time. She appears well-developed and well-nourished. No distress.  HENT:  Head: Normocephalic and atraumatic.  Eyes: Pupils are equal, round, and reactive to light. Conjunctivae and EOM are normal.  Neck: Normal range of motion. Neck supple. No thyromegaly present.  Cardiovascular: Normal rate, regular rhythm, normal heart sounds and intact distal pulses.  No murmur heard. Pulmonary/Chest: Effort normal and breath sounds normal. No respiratory distress.  Abdominal: Soft. She exhibits no distension. There is no tenderness.  Musculoskeletal: She exhibits no edema.  Lymphadenopathy:    She has no cervical adenopathy.  Neurological: She is alert and oriented to person, place, and time.  Skin: Skin is warm and dry.  Psychiatric: She has a normal mood and affect. Her behavior is normal.  Vitals reviewed.         Assessment & Plan:

## 2018-04-27 NOTE — Telephone Encounter (Signed)
Phone call returned to the pt.  Reported headache and intermittent dizziness for the past 3-4 weeks, with increased symptoms over past 1-2 weeks.  Described her headache as a tightness on top of her head.  Reported she has taken medications for Migraine headaches for a long time, but "the tightness she feels is different" than her typical migraine. Reported it is mild to moderate discomfort, and "I have to push myself" to continue with daily routine.  Reported intermittent feeling of light-headedness, especially with bending over.  Denied blurred vision, difficulty with speech, facial droop, or numbness/ weakness in extremities.  Stated she has taken Tylenol for the headache, and "it takes the edge off, but does not completely resolve it."  Reported several BP readings over past several days, that range from 138/75-165/68.  Stated "if there is any way I can be seen today, I would appreciate it."    Spoke with FC, [place].  Dr. Birdie Riddle does not have any avail. Openings today or tomorrow.  Advised that Dr. Jonni Sanger and Elyn Aquas have available times. Offered pt. Appt. Today at 3:45 PM with Dr. Jonni Sanger.  Agreed.  Care advice given per protocol.  Verb. Understanding.       Reason for Disposition . [1] MODERATE headache (e.g., interferes with normal activities) AND [2] present > 24 hours AND [3] unexplained  (Exceptions: analgesics not tried, typical migraine, or headache part of viral illness)  Answer Assessment - Initial Assessment Questions 1. LOCATION: "Where does it hurt?"      Feels tight all over her head, especially in the top of the head  2. ONSET: "When did the headache start?" (Minutes, hours or days)      About 3- 4 wks. Ago  3. PATTERN: "Does the pain come and go, or has it been constant since it started?"     Continuous throughout day; Tylenol eases it a little, but it doesn't go completely away 4. SEVERITY: "How bad is the pain?" and "What does it keep you from doing?"  (e.g., Scale 1-10; mild,  moderate, or severe)   - MILD (1-3): doesn't interfere with normal activities    - MODERATE (4-7): interferes with normal activities or awakens from sleep    - SEVERE (8-10): excruciating pain, unable to do any normal activities        Mild - moderate 5. RECURRENT SYMPTOM: "Have you ever had headaches before?" If so, ask: "When was the last time?" and "What happened that time?"      Doesn't remember experiencing this tightness in head before  6. CAUSE: "What do you think is causing the headache?"     No  7. MIGRAINE: "Have you been diagnosed with migraine headaches?" If so, ask: "Is this headache similar?"      Yes; but Migraines have been stable for quite a long time 8. HEAD INJURY: "Has there been any recent injury to the head?"      Denied injury 9. OTHER SYMPTOMS: "Do you have any other symptoms?" (fever, stiff neck, eye pain, sore throat, cold symptoms)      Intermittent nausea, intermittent light headed feeling with bending over; denied facial droop, difficulty with speech, or unilateral weakness of extremities, or blurred vision; BP readings: 10/22 PM 138/75, 10/23 AM150/75, PM 165/68, 10/24 1:00 PM 132/69, evening 147/71; 10/27 evening 130/65 ; 10/28 AM 158/71  Protocols used: HEADACHE-A-AH

## 2018-05-22 ENCOUNTER — Encounter: Payer: Self-pay | Admitting: Family Medicine

## 2018-05-22 ENCOUNTER — Other Ambulatory Visit: Payer: Self-pay

## 2018-05-22 ENCOUNTER — Ambulatory Visit: Payer: Medicare Other | Admitting: Family Medicine

## 2018-05-22 VITALS — BP 124/68 | HR 94 | Temp 98.0°F | Resp 16 | Ht 63.0 in | Wt 160.2 lb

## 2018-05-22 DIAGNOSIS — I10 Essential (primary) hypertension: Secondary | ICD-10-CM | POA: Diagnosis not present

## 2018-05-22 NOTE — Patient Instructions (Signed)
Follow up as scheduled No med changes at this time Keep up the good work on healthy diet and regular exercise- you're doing great! Call with any questions or concerns Happy Thanksgiving!!!

## 2018-05-22 NOTE — Assessment & Plan Note (Signed)
Much improved since last visit with lifestyle changes.  She has cleaned up her diet and BP has normalized.  No med changes at this time.  Will continue to follow.

## 2018-05-22 NOTE — Progress Notes (Signed)
   Subjective:    Patient ID: Jacqueline [name], female    DOB: May 25, 1935, 83 y.o.   MRN: [id number]  HPI  HTN- chronic problem, on Benicar 40mg , Amlodipine 5mg  w/ good control today.  Meds were not changed at last visit.  She is no longer dizzy or light headed.  She has eliminated her chocolate, caffeine, and salty foods.  No CP, SOB, HAs, edema.   Review of Systems For ROS see HPI     Objective:   Physical Exam  Constitutional: She is oriented to person, place, and time. She appears well-developed and well-nourished. No distress.  HENT:  Head: Normocephalic and atraumatic.  Eyes: Pupils are equal, round, and reactive to light. Conjunctivae and EOM are normal.  Neck: Normal range of motion. Neck supple. No thyromegaly present.  Cardiovascular: Normal rate, regular rhythm, normal heart sounds and intact distal pulses.  No murmur heard. Pulmonary/Chest: Effort normal and breath sounds normal. No respiratory distress.  Abdominal: Soft. She exhibits no distension. There is no tenderness.  Musculoskeletal: She exhibits no edema.  Lymphadenopathy:    She has no cervical adenopathy.  Neurological: She is alert and oriented to person, place, and time.  Skin: Skin is warm and dry.  Psychiatric: She has a normal mood and affect. Her behavior is normal.  Vitals reviewed.         Assessment & Plan:

## 2018-05-25 ENCOUNTER — Other Ambulatory Visit: Payer: Self-pay | Admitting: Family Medicine

## 2018-05-29 ENCOUNTER — Other Ambulatory Visit: Payer: Self-pay | Admitting: Family Medicine

## 2018-06-09 NOTE — Progress Notes (Deleted)
Subjective:   Jacqueline [name] is a 83 y.o. female who presents for Medicare Annual (Subsequent) preventive examination.  Review of Systems:  No ROS.  Medicare Wellness Visit. Additional risk factors are reflected in the social history.    Sleep patterns: Home Safety/Smoke Alarms: Feels safe in home. Smoke alarms in place.  Living environment; residence and Firearm Safety: Lives with husband in 1 story home with attic.  Seat Belt Safety/Bike Helmet: Wears seat belt.   Female:   [id number]       Mammo-07/10/2017, breast CA. Bilateral mastectomy.        Dexa scan-07/05/2016, Osteopenia.         CCS-Colonoscopy 06/03/2012, polyp. Recall 5 years. Aged out.      Objective:     Vitals: There were no vitals taken for this visit.  There is no height or weight on file to calculate BMI.  Advanced Directives 10/30/2017 09/04/2017 08/27/2017 07/22/2017 05/28/2017 07/29/2016 06/14/2016  Does Patient Have a Medical Advance Directive? Yes Yes Yes Yes Yes Yes Yes  Type of Paramedic of [place];Living will Living will Living will [place];Living will [place];Living will - Living will  Does patient want to make changes to medical advance directive? No - Patient declined No - Patient declined No - Patient declined - - - No - Patient declined  Copy of [place] in Chart? No - copy requested - - No - copy requested No - copy requested - -    Tobacco Social History   Tobacco Use  Smoking Status Never Smoker  Smokeless Tobacco Never Used     Counseling given: Not Answered   Past Medical History:  Diagnosis Date  . Arthritis    "was probably in my knees" (09/04/2017)  . Bilateral breast cancer ([place]) 2007, 2012   breast- bilateral  . Breast cancer ([place]) hx 2007   stage T1b grade 2 ER/positive,ductal ca left breast  . Family history of adverse reaction to anesthesia    "granddaughters get PONV, wild, combative"  (09/04/2017)  . Family history of breast cancer   . Family history of pancreatic cancer   . GERD (gastroesophageal reflux disease)   . Glaucoma, both eyes   . History of kidney stones   . Hyperlipidemia   . Hypertension   . Migraine    "none in the 2000s" (09/04/2017)  . Osteopenia   . Personal history of chemotherapy   . Personal history of radiation therapy   . PONV (postoperative nausea and vomiting)   . Right rotator cuff tear 08/2012  . Ruptured disc, cervical   . Skin cancer    "cut off face" (09/04/2017)  . Sleep initiation dysfunction    Past Surgical History:  Procedure Laterality Date  . BREAST BIOPSY Bilateral   . BREAST LUMPECTOMY WITH RADIOACTIVE SEED LOCALIZATION Right 06/14/2016   Procedure: RIGHT BREAST LUMPECTOMY WITH RADIOACTIVE SEED LOCALIZATION;  Surgeon: Autumn Messing III, MD;  Location: [facility];  Service: General;  Laterality: Right;  . CATARACT EXTRACTION, BILATERAL Bilateral   . CHOLECYSTECTOMY OPEN    . COLONOSCOPY W/ BIOPSIES AND POLYPECTOMY    . DILATION AND CURETTAGE OF UTERUS    . GLAUCOMA SURGERY Bilateral    "lasered"  . INCISION AND DRAINAGE BREAST ABSCESS Left 05/2006   Developed in the mammosite cavity and treated with I&D, packed and healed   . JOINT REPLACEMENT    . [place]   "  opened me up"  . MASTECTOMY COMPLETE / SIMPLE W/ SENTINEL NODE BIOPSY Bilateral 09/04/2017  . MASTECTOMY PARTIAL / LUMPECTOMY Left 2007   Dr Margot Chimes  . MASTECTOMY PARTIAL / LUMPECTOMY  2012 and 2017   Right Dr Margot Chimes 2012, Dr. Marlou Starks 2017  . MASTECTOMY W/ SENTINEL NODE BIOPSY Bilateral 09/04/2017   Procedure: BILATERAL MASTECTOMIES WITH SENTINEL LYMPH NODE BIOPSY;  Surgeon: Jovita Kussmaul, MD;  Location: [facility];  Service: General;  Laterality: Bilateral;  . PORTACATH PLACEMENT     "it's been removed" (09/04/2017)  . REPLACEMENT TOTAL KNEE BILATERAL  ~ 2007`- ~ 2012   left-right  . SKIN CANCER EXCISION     "face"  . TONSILLECTOMY    . WISDOM  TOOTH EXTRACTION     Family History  Problem Relation Age of Onset  . Hypertension Mother   . Colon polyps Mother   . Stroke Father   . Cancer Brother        throat  . Pancreatic cancer Other        dx in her 69s  . Breast cancer Cousin        paternal cousin died in her 43s  . Colon cancer Neg Hx   . Stomach cancer Neg Hx    Social History   Socioeconomic History  . Marital status: Married    Spouse name: Not on file  . Number of children: Not on file  . Years of education: Not on file  . Highest education level: Not on file  Occupational History  . Not on file  Social Needs  . Financial resource strain: Not on file  . Food insecurity:    Worry: Not on file    Inability: Not on file  . Transportation needs:    Medical: Not on file    Non-medical: Not on file  Tobacco Use  . Smoking status: Never Smoker  . Smokeless tobacco: Never Used  Substance and Sexual Activity  . Alcohol use: No    Alcohol/week: 0.0 standard drinks  . Drug use: No  . Sexual activity: Not on file  Lifestyle  . Physical activity:    Days per week: Not on file    Minutes per session: Not on file  . Stress: Not on file  Relationships  . Social connections:    Talks on phone: Not on file    Gets together: Not on file    Attends religious service: Not on file    Active member of club or organization: Not on file    Attends meetings of clubs or organizations: Not on file    Relationship status: Not on file  Other Topics Concern  . Not on file  Social History Narrative  . Not on file    Outpatient Encounter Medications as of 06/10/2018  Medication Sig  . acetaminophen (TYLENOL) 500 MG tablet Take 1,000 mg by mouth daily as needed for moderate pain or headache.  Marland Kitchen amLODipine (NORVASC) 5 MG tablet TAKE 1 TABLET BY MOUTH EVERY DAY  . anastrozole (ARIMIDEX) 1 MG tablet TAKE 1 TABLET BY MOUTH EVERY DAY  . cholecalciferol (VITAMIN D) 1000 units tablet Take 2,000 Units by mouth every evening.    . diclofenac sodium (VOLTAREN) 1 % GEL apply topically once daily as needed for pain  . ibuprofen (ADVIL,MOTRIN) 200 MG tablet Take 400 mg by mouth daily as needed for headache or moderate pain.  Marland Kitchen latanoprost (XALATAN) 0.005 % ophthalmic solution Place 1 drop into both eyes at  bedtime.  Marland Kitchen loperamide (IMODIUM) 2 MG capsule Take 3 mg by mouth as needed for diarrhea or loose stools.   . mirtazapine (REMERON) 15 MG tablet Take 1 tablet (15 mg total) by mouth at bedtime.  . Multiple Vitamins-Minerals (PRESERVISION AREDS 2) CAPS Take 1 capsule by mouth 2 (two) times daily.  Marland Kitchen nystatin ointment (MYCOSTATIN) Apply 1 application topically every evening.   . olmesartan (BENICAR) 40 MG tablet TAKE 1 TABLET BY MOUTH EVERY DAY  . omeprazole (PRILOSEC OTC) 20 MG tablet Take 1 tablet (20 mg total) by mouth daily.  . QUEtiapine (SEROQUEL) 25 MG tablet TAKE 1 TABLET BY MOUTH EVERYDAY AT BEDTIME  . TAZORAC 0.05 % cream APPLY TO AFFECTED AREA IN THE EVENING   No facility-administered encounter medications on file as of 06/10/2018.     Activities of Daily Living In your present state of health, do you have any difficulty performing the following activities: 05/22/2018 09/04/2017  Hearing? N -  Vision? N -  Difficulty concentrating or making decisions? N -  Walking or climbing stairs? N -  Dressing or bathing? N -  Doing errands, shopping? N N  Some recent data might be hidden    Patient Care Team: Midge Minium, MD as PCP - General (Family Medicine) Luberta Mutter, MD as Consulting Physician (Ophthalmology) Kristeen Miss, MD as Consulting Physician (Neurosurgery) Elsie Saas, MD as Consulting Physician (Orthopedic Surgery) Jari Pigg, MD as Consulting Physician (Dermatology) Jovita Kussmaul, MD as Consulting Physician (General Surgery) Nicholas Lose, MD as Consulting Physician (Hematology and Oncology) Delice Bison Charlestine Massed, NP as Nurse Practitioner (Hematology and Oncology)     Assessment:   This is a routine wellness examination for [facility].  Exercise Activities and Dietary recommendations   Diet (meal preparation, eat out, water intake, caffeinated beverages, dairy products, fruits and vegetables):   Breakfast: Lunch:  Dinner:      Goals    . Patient Stated     Maintain current health       Fall Risk Fall Risk  05/22/2018 07/22/2017 06/03/2017 05/28/2017 05/17/2016  Falls in the past year? 0 No No No No  Number falls in past yr: - - - - -  Injury with Fall? - - - - -    Depression Screen PHQ 2/9 Scores 05/22/2018 07/22/2017 07/17/2017 06/03/2017  PHQ - 2 Score 0 0 0 0  PHQ- 9 Score 0 - 0 0     Cognitive Function MMSE - Mini Mental State Exam 05/28/2017  Orientation to time 5  Orientation to Place 5  Registration 3  Attention/ Calculation 5  Recall 2  Language- name 2 objects 2  Language- repeat 1  Language- follow 3 step command 3  Language- read & follow direction 1  Write a sentence 1  Copy design 1  Total score 29        Immunization History  Administered Date(s) Administered  . Influenza Split 06/06/2011, 04/15/2012, 03/31/2013  . Influenza Whole 04/08/2007, 04/02/2008, 04/20/2009, 04/23/2010  . Influenza,inj,Quad PF,6+ Mos 02/21/2016, 05/12/2017, 02/21/2018  . Influenza-Unspecified 03/31/2014, 03/02/2015  . Pneumococcal Conjugate-13 06/30/2013  . Pneumococcal Polysaccharide-23 07/01/2001, 04/08/2007, 05/17/2016  . Td 07/01/2002  . Tdap 06/08/2012  . Zoster 07/02/2003     Screening Tests Health Maintenance  Topic Date Due  . COLONOSCOPY  06/03/2017  . TETANUS/TDAP  06/08/2022  . INFLUENZA VACCINE  Completed  . DEXA SCAN  Completed  . PNA vac Low Risk Adult  Completed  Plan:     I have personally reviewed and noted the following in the patient's chart:   . Medical and social history . Use of alcohol, tobacco or illicit drugs  . Current medications and supplements . Functional ability and  status . Nutritional status . Physical activity . Advanced directives . List of other physicians . Hospitalizations, surgeries, and ER visits in previous 12 months . Vitals . Screenings to include cognitive, depression, and falls . Referrals and appointments  In addition, I have reviewed and discussed with patient certain preventive protocols, quality metrics, and best practice recommendations. A written personalized care plan for preventive services as well as general preventive health recommendations were provided to patient.     Gerilyn Nestle, RN  06/09/2018

## 2018-06-10 ENCOUNTER — Ambulatory Visit: Payer: Medicare Other

## 2018-06-10 ENCOUNTER — Encounter: Payer: Medicare Other | Admitting: Family Medicine

## 2018-06-25 ENCOUNTER — Ambulatory Visit: Payer: Medicare Other | Admitting: Family Medicine

## 2018-06-25 ENCOUNTER — Ambulatory Visit (INDEPENDENT_AMBULATORY_CARE_PROVIDER_SITE_OTHER): Payer: Medicare Other

## 2018-06-25 ENCOUNTER — Other Ambulatory Visit: Payer: Self-pay

## 2018-06-25 ENCOUNTER — Encounter: Payer: Self-pay | Admitting: Family Medicine

## 2018-06-25 VITALS — BP 142/82 | HR 78 | Temp 97.7°F | Resp 16 | Ht 63.0 in | Wt 162.2 lb

## 2018-06-25 VITALS — BP 136/82 | HR 78 | Temp 97.7°F | Resp 16 | Ht 63.0 in | Wt 162.2 lb

## 2018-06-25 DIAGNOSIS — Z0000 Encounter for general adult medical examination without abnormal findings: Secondary | ICD-10-CM | POA: Diagnosis not present

## 2018-06-25 DIAGNOSIS — I10 Essential (primary) hypertension: Secondary | ICD-10-CM

## 2018-06-25 DIAGNOSIS — E559 Vitamin D deficiency, unspecified: Secondary | ICD-10-CM | POA: Diagnosis not present

## 2018-06-25 DIAGNOSIS — Z23 Encounter for immunization: Secondary | ICD-10-CM | POA: Diagnosis not present

## 2018-06-25 LAB — BASIC METABOLIC PANEL
BUN: 17 mg/dL (ref 6–23)
CO2: 25 mEq/L (ref 19–32)
Calcium: 9.6 mg/dL (ref 8.4–10.5)
Chloride: 108 mEq/L (ref 96–112)
Creatinine, Ser: 0.91 mg/dL (ref 0.40–1.20)
GFR: 62.98 mL/min (ref >60.00)
Glucose, Bld: 102 mg/dL — ABNORMAL HIGH (ref 70–99)
Potassium: 3.8 mEq/L (ref 3.5–5.1)
Sodium: 141 mEq/L (ref 135–145)

## 2018-06-25 LAB — LIPID PANEL
Cholesterol: 221 mg/dL — ABNORMAL HIGH (ref 0–200)
HDL: 39.2 mg/dL (ref >39.00)
NonHDL: 181.46
Total CHOL/HDL Ratio: 6
Triglycerides: 276 mg/dL — ABNORMAL HIGH (ref 0.0–149.0)
VLDL: 55.2 mg/dL — ABNORMAL HIGH (ref 0.0–40.0)

## 2018-06-25 LAB — VITAMIN D 25 HYDROXY (VIT D DEFICIENCY, FRACTURES): VITD: 24.13 ng/mL — ABNORMAL LOW (ref 30.00–100.00)

## 2018-06-25 LAB — CBC WITH DIFFERENTIAL/PLATELET
Basophils Absolute: 0 10*3/uL (ref 0.0–0.1)
Basophils Relative: 0.7 % (ref 0.0–3.0)
Eosinophils Absolute: 0.1 10*3/uL (ref 0.0–0.7)
Eosinophils Relative: 1 % (ref 0.0–5.0)
HCT: 39.7 % (ref 36.0–46.0)
Hemoglobin: 13.4 g/dL (ref 12.0–15.0)
Lymphocytes Relative: 23.5 % (ref 12.0–46.0)
Lymphs Abs: 1.6 10*3/uL (ref 0.7–4.0)
MCHC: 33.7 g/dL (ref 30.0–36.0)
MCV: 91.4 fl (ref 78.0–100.0)
Monocytes Absolute: 0.5 10*3/uL (ref 0.1–1.0)
Monocytes Relative: 7.3 % (ref 3.0–12.0)
Neutro Abs: 4.5 10*3/uL (ref 1.4–7.7)
Neutrophils Relative %: 67.5 % (ref 43.0–77.0)
Platelets: 152 10*3/uL (ref 150.0–400.0)
RBC: 4.34 Mil/uL (ref 3.87–5.11)
RDW: 12.8 % (ref 11.5–15.5)
WBC: 6.7 10*3/uL (ref 4.0–10.5)

## 2018-06-25 LAB — TSH: TSH: 1.51 u[IU]/mL (ref 0.35–4.50)

## 2018-06-25 LAB — HEPATIC FUNCTION PANEL
ALT: 17 U/L (ref 0–35)
AST: 18 U/L (ref 0–37)
Albumin: 4.1 g/dL (ref 3.5–5.2)
Alkaline Phosphatase: 88 U/L (ref 39–117)
Bilirubin, Direct: 0.1 mg/dL (ref 0.0–0.3)
Total Bilirubin: 0.4 mg/dL (ref 0.2–1.2)
Total Protein: 7.2 g/dL (ref 6.0–8.3)

## 2018-06-25 LAB — LDL CHOLESTEROL, DIRECT: Direct LDL: 147 mg/dL

## 2018-06-25 MED ORDER — ZOSTER VAC RECOMB ADJUVANTED 50 MCG/0.5ML IM SUSR: 0.5000 mL | Freq: Once | INTRAMUSCULAR | 1 refills | Status: AC

## 2018-06-25 NOTE — Progress Notes (Addendum)
Subjective:   Jacqueline [name] is a 83 y.o. female who presents for Medicare Annual (Subsequent) preventive examination.  Review of Systems:  No ROS.  Medicare Wellness Visit. Additional risk factors are reflected in the social history.  Cardiac Risk Factors include: advanced age (>25men, >32 women);dyslipidemia;hypertension;family history of premature cardiovascular disease   Sleep patterns: Sleeps 7-8 hours. Up to void x 2.  Home Safety/Smoke Alarms: Feels safe in home. Smoke alarms in place.  Living environment; residence and Firearm Safety: Lives with husband in 1 story home with attic/basement. Rail in place.  Seat Belt Safety/Bike Helmet: Wears seat belt.   Female:   [id number]      Mammo-07/03/2017, right breast Ca. Double Mastectomy 08/2018.   Dexa scan-07/05/2016, Osteopenia.       CCS-Colonoscopy 06/03/2012, polyp. Recall 5 years. Aged out.      Objective:     Vitals: BP (!) 142/82 (BP Location: Left Arm, Cuff Size: Normal)   Pulse 78   Temp 97.7 F (36.5 C) (Temporal)   Resp 16   Ht 5\' 3"  (1.6 m)   Wt 162 lb 4 oz (73.6 kg)   SpO2 98%   BMI 28.74 kg/m   Body mass index is 28.74 kg/m.  Advanced Directives 06/25/2018 10/30/2017 09/04/2017 08/27/2017 07/22/2017 05/28/2017 07/29/2016  Does Patient Have a Medical Advance Directive? Yes Yes Yes Yes Yes Yes Yes  Type of Paramedic of [place];Living will [place];Living will Living will Living will [place];Living will [place];Living will -  Does patient want to make changes to medical advance directive? - No - Patient declined No - Patient declined No - Patient declined - - -  Copy of [place] in Chart? No - copy requested No - copy requested - - No - copy requested No - copy requested -    Tobacco Social History   Tobacco Use  Smoking Status Never Smoker  Smokeless Tobacco Never Used     Counseling given: Not  Answered   Past Medical History:  Diagnosis Date  . Arthritis    "was probably in my knees" (09/04/2017)  . Bilateral breast cancer ([place]) 2007, 2012   breast- bilateral  . Breast cancer ([place]) hx 2007   stage T1b grade 2 ER/positive,ductal ca left breast  . Family history of adverse reaction to anesthesia    "granddaughters get PONV, wild, combative" (09/04/2017)  . Family history of breast cancer   . Family history of pancreatic cancer   . GERD (gastroesophageal reflux disease)   . Glaucoma, both eyes   . History of kidney stones   . Hyperlipidemia   . Hypertension   . Migraine    "none in the 2000s" (09/04/2017)  . Osteopenia   . Personal history of chemotherapy   . Personal history of radiation therapy   . PONV (postoperative nausea and vomiting)   . Right rotator cuff tear 08/2012  . Ruptured disc, cervical   . Skin cancer    "cut off face" (09/04/2017)  . Sleep initiation dysfunction    Past Surgical History:  Procedure Laterality Date  . BREAST BIOPSY Bilateral   . BREAST LUMPECTOMY WITH RADIOACTIVE SEED LOCALIZATION Right 06/14/2016   Procedure: RIGHT BREAST LUMPECTOMY WITH RADIOACTIVE SEED LOCALIZATION;  Surgeon: Autumn Messing III, MD;  Location: [facility];  Service: General;  Laterality: Right;  . CATARACT EXTRACTION, BILATERAL Bilateral   . CHOLECYSTECTOMY OPEN    . COLONOSCOPY  W/ BIOPSIES AND POLYPECTOMY    . DILATION AND CURETTAGE OF UTERUS    . GLAUCOMA SURGERY Bilateral    "lasered"  . INCISION AND DRAINAGE BREAST ABSCESS Left 05/2006   Developed in the mammosite cavity and treated with I&D, packed and healed   . JOINT REPLACEMENT    . [place]   "opened me up"  . MASTECTOMY COMPLETE / SIMPLE W/ SENTINEL NODE BIOPSY Bilateral 09/04/2017  . MASTECTOMY PARTIAL / LUMPECTOMY Left 2007   Dr Margot Chimes  . MASTECTOMY PARTIAL / LUMPECTOMY  2012 and 2017   Right Dr Margot Chimes 2012, Dr. Marlou Starks 2017  . MASTECTOMY W/ SENTINEL NODE BIOPSY Bilateral  09/04/2017   Procedure: BILATERAL MASTECTOMIES WITH SENTINEL LYMPH NODE BIOPSY;  Surgeon: Jovita Kussmaul, MD;  Location: [facility];  Service: General;  Laterality: Bilateral;  . PORTACATH PLACEMENT     "it's been removed" (09/04/2017)  . REPLACEMENT TOTAL KNEE BILATERAL  ~ 2007`- ~ 2012   left-right  . SKIN CANCER EXCISION     "face"  . TONSILLECTOMY    . WISDOM TOOTH EXTRACTION     Family History  Problem Relation Age of Onset  . Hypertension Mother   . Colon polyps Mother   . Stroke Father   . Cancer Brother        throat  . Stroke Brother   . Hyperlipidemia Sister   . Hypertension Sister   . Congenital heart disease Brother   . Pancreatic cancer Other        dx in her 71s  . Breast cancer Cousin        paternal cousin died in her 77s  . Diabetes Paternal Uncle   . Colon cancer Neg Hx   . Stomach cancer Neg Hx    Social History   Socioeconomic History  . Marital status: Married    Spouse name: Not on file  . Number of children: Not on file  . Years of education: Not on file  . Highest education level: Not on file  Occupational History  . Not on file  Social Needs  . Financial resource strain: Not on file  . Food insecurity:    Worry: Not on file    Inability: Not on file  . Transportation needs:    Medical: Not on file    Non-medical: Not on file  Tobacco Use  . Smoking status: Never Smoker  . Smokeless tobacco: Never Used  Substance and Sexual Activity  . Alcohol use: No    Alcohol/week: 0.0 standard drinks  . Drug use: No  . Sexual activity: Not on file  Lifestyle  . Physical activity:    Days per week: Not on file    Minutes per session: Not on file  . Stress: Not on file  Relationships  . Social connections:    Talks on phone: Not on file    Gets together: Not on file    Attends religious service: Not on file    Active member of club or organization: Not on file    Attends meetings of clubs or organizations: Not on file    Relationship status: Not on  file  Other Topics Concern  . Not on file  Social History Narrative  . Not on file    Outpatient Encounter Medications as of 06/25/2018  Medication Sig  . acetaminophen (TYLENOL) 500 MG tablet Take 1,000 mg by mouth daily as needed for moderate pain or headache.  Marland Kitchen amLODipine (NORVASC)  5 MG tablet TAKE 1 TABLET BY MOUTH EVERY DAY  . anastrozole (ARIMIDEX) 1 MG tablet TAKE 1 TABLET BY MOUTH EVERY DAY  . cholecalciferol (VITAMIN D) 1000 units tablet Take 2,000 Units by mouth every evening.  . diclofenac sodium (VOLTAREN) 1 % GEL apply topically once daily as needed for pain  . ibuprofen (ADVIL,MOTRIN) 200 MG tablet Take 400 mg by mouth daily as needed for headache or moderate pain.  Marland Kitchen latanoprost (XALATAN) 0.005 % ophthalmic solution Place 1 drop into both eyes at bedtime.  Marland Kitchen loperamide (IMODIUM) 2 MG capsule Take 3 mg by mouth as needed for diarrhea or loose stools.   . mirtazapine (REMERON) 15 MG tablet Take 1 tablet (15 mg total) by mouth at bedtime.  . Multiple Vitamins-Minerals (PRESERVISION AREDS 2) CAPS Take 1 capsule by mouth 2 (two) times daily.  Marland Kitchen nystatin ointment (MYCOSTATIN) Apply 1 application topically every evening.   . olmesartan (BENICAR) 40 MG tablet TAKE 1 TABLET BY MOUTH EVERY DAY  . omeprazole (PRILOSEC OTC) 20 MG tablet Take 1 tablet (20 mg total) by mouth daily.  . QUEtiapine (SEROQUEL) 25 MG tablet TAKE 1 TABLET BY MOUTH EVERYDAY AT BEDTIME  . TAZORAC 0.05 % cream APPLY TO AFFECTED AREA IN THE EVENING  . Zoster Vaccine Adjuvanted [facility]) injection Inject 0.5 mLs into the muscle once for 1 dose.   No facility-administered encounter medications on file as of 06/25/2018.     Activities of Daily Living In your present state of health, do you have any difficulty performing the following activities: 06/25/2018 05/22/2018  Hearing? N N  Vision? N N  Difficulty concentrating or making decisions? N N  Walking or climbing stairs? N N  Dressing or bathing? N N    Doing errands, shopping? N N  Preparing Food and eating ? N -  Using the Toilet? N -  In the past six months, have you accidently leaked urine? N -  Do you have problems with loss of bowel control? N -  Managing your Medications? N -  Managing your Finances? N -  Housekeeping or managing your Housekeeping? N -  Some recent data might be hidden    Patient Care Team: Midge Minium, MD as PCP - General (Family Medicine) Luberta Mutter, MD as Consulting Physician (Ophthalmology) Kristeen Miss, MD as Consulting Physician (Neurosurgery) Elsie Saas, MD as Consulting Physician (Orthopedic Surgery) Jari Pigg, MD as Consulting Physician (Dermatology) Jovita Kussmaul, MD as Consulting Physician (General Surgery) Nicholas Lose, MD as Consulting Physician (Hematology and Oncology) Delice Bison Charlestine Massed, NP as Nurse Practitioner (Hematology and Oncology)    Assessment:   This is a routine wellness examination for [facility].  Exercise Activities and Dietary recommendations Current Exercise Habits: The patient does not participate in regular exercise at present(Church; antiques), Exercise limited by: None identified   Diet (meal preparation, eat out, water intake, caffeinated beverages, dairy products, fruits and vegetables): Drinks water, tea and diet gingerale.   Breakfast: Eggo, pb, jelly; egg/toast Lunch: sandwich; salad Dinner: protein and vegetables, fruit Avoids fried foods.   Goals    . Patient Stated     Maintain current health    . Patient Stated     Maintain current health by staying active.        Fall Risk Fall Risk  06/25/2018 05/22/2018 07/22/2017 06/03/2017 05/28/2017  Falls in the past year? 0 0 No No No  Number falls in past yr: - - - - -  Injury with Fall? - - - - -  Depression Screen PHQ 2/9 Scores 06/25/2018 05/22/2018 07/22/2017 07/17/2017  PHQ - 2 Score 0 0 0 0  PHQ- 9 Score - 0 - 0     Cognitive Function MMSE - Mini Mental State Exam  06/25/2018 05/28/2017  Orientation to time 5 5  Orientation to Place 5 5  Registration 3 3  Attention/ Calculation 5 5  Recall 3 2  Language- name 2 objects 2 2  Language- repeat 1 1  Language- follow 3 step command 3 3  Language- read & follow direction 1 1  Write a sentence 1 1  Copy design 1 1  Total score 30 29        Immunization History  Administered Date(s) Administered  . Influenza Split 06/06/2011, 04/15/2012, 03/31/2013  . Influenza Whole 04/08/2007, 04/02/2008, 04/20/2009, 04/23/2010  . Influenza,inj,Quad PF,6+ Mos 02/21/2016, 05/12/2017, 02/21/2018  . Influenza-Unspecified 03/31/2014, 03/02/2015  . Pneumococcal Conjugate-13 06/30/2013  . Pneumococcal Polysaccharide-23 07/01/2001, 04/08/2007, 05/17/2016  . Td 07/01/2002  . Tdap 06/08/2012  . Zoster 07/02/2003     Screening Tests Health Maintenance  Topic Date Due  . COLONOSCOPY  06/26/2019 (Originally 06/03/2017)  . TETANUS/TDAP  06/08/2022  . INFLUENZA VACCINE  Completed  . DEXA SCAN  Completed  . PNA vac Low Risk Adult  Completed       Plan:     Shingles vaccine at pharmacy.   Bring a copy of your living will and/or healthcare power of attorney to your next office visit.  Continue doing brain stimulating activities (puzzles, reading, adult coloring books, staying active) to keep memory sharp.   I have personally reviewed and noted the following in the patient's chart:   . Medical and social history . Use of alcohol, tobacco or illicit drugs  . Current medications and supplements . Functional ability and status . Nutritional status . Physical activity . Advanced directives . List of other physicians . Hospitalizations, surgeries, and ER visits in previous 12 months . Vitals . Screenings to include cognitive, depression, and falls . Referrals and appointments  In addition, I have reviewed and discussed with patient certain preventive protocols, quality metrics, and best practice  recommendations. A written personalized care plan for preventive services as well as general preventive health recommendations were provided to patient.     Gerilyn Nestle, RN  06/25/2018  Reviewed documentation provided by RN and agree w/ above.  Annye Asa, MD

## 2018-06-25 NOTE — Patient Instructions (Signed)
Follow up in 6 months to recheck BP We'll notify you of your lab results and make any changes if needed Keep up the good work on healthy diet and regular exercise- you look great! Call with any questions or concerns Happy New Year!!!

## 2018-06-25 NOTE — Assessment & Plan Note (Signed)
Pt has hx of this.  Check labs and replete prn. 

## 2018-06-25 NOTE — Patient Instructions (Addendum)
Shingles vaccine at pharmacy.   Bring a copy of your living will and/or healthcare power of attorney to your next office visit.  Continue doing brain stimulating activities (puzzles, reading, adult coloring books, staying active) to keep memory sharp.   Health Maintenance, Female Adopting a healthy lifestyle and getting preventive care can go a long way to promote health and wellness. Talk with your health care provider about what schedule of regular examinations is right for you. This is a good chance for you to check in with your provider about disease prevention and staying healthy. In between checkups, there are plenty of things you can do on your own. Experts have done a lot of research about which lifestyle changes and preventive measures are most likely to keep you healthy. Ask your health care provider for more information. Weight and diet Eat a healthy diet  Be sure to include plenty of vegetables, fruits, low-fat dairy products, and lean protein.  Do not eat a lot of foods high in solid fats, added sugars, or salt.  Get regular exercise. This is one of the most important things you can do for your health. ? Most adults should exercise for at least 150 minutes each week. The exercise should increase your heart rate and make you sweat (moderate-intensity exercise). ? Most adults should also do strengthening exercises at least twice a week. This is in addition to the moderate-intensity exercise. Maintain a healthy weight  Body mass index (BMI) is a measurement that can be used to identify possible weight problems. It estimates body fat based on height and weight. Your health care provider can help determine your BMI and help you achieve or maintain a healthy weight.  For females 20 years of age and older: ? A BMI below 18.5 is considered underweight. ? A BMI of 18.5 to 24.9 is normal. ? A BMI of 25 to 29.9 is considered overweight. ? A BMI of 30 and above is considered obese. Watch  levels of cholesterol and blood lipids  You should start having your blood tested for lipids and cholesterol at 81 years of age, then have this test every 5 years.  You may need to have your cholesterol levels checked more often if: ? Your lipid or cholesterol levels are high. ? You are older than 81 years of age. ? You are at high risk for heart disease. Cancer screening Lung Cancer  Lung cancer screening is recommended for adults 55-80 years old who are at high risk for lung cancer because of a history of smoking.  A yearly low-dose CT scan of the lungs is recommended for people who: ? Currently smoke. ? Have quit within the past 15 years. ? Have at least a 30-pack-year history of smoking. A pack year is smoking an average of one pack of cigarettes a day for 1 year.  Yearly screening should continue until it has been 15 years since you quit.  Yearly screening should stop if you develop a health problem that would prevent you from having lung cancer treatment. Breast Cancer  Practice breast self-awareness. This means understanding how your breasts normally appear and feel.  It also means doing regular breast self-exams. Let your health care provider know about any changes, no matter how small.  If you are in your 20s or 30s, you should have a clinical breast exam (CBE) by a health care provider every 1-3 years as part of a regular health exam.  If you are 40 or older, have a   CBE every year. Also consider having a breast X-ray (mammogram) every year.  If you have a family history of breast cancer, talk to your health care provider about genetic screening.  If you are at high risk for breast cancer, talk to your health care provider about having an MRI and a mammogram every year.  Breast cancer gene (BRCA) assessment is recommended for women who have family members with BRCA-related cancers. BRCA-related cancers include: ? Breast. ? Ovarian. ? Tubal. ? Peritoneal  cancers.  Results of the assessment will determine the need for genetic counseling and BRCA1 and BRCA2 testing. Cervical Cancer Your health care provider may recommend that you be screened regularly for cancer of the pelvic organs (ovaries, uterus, and vagina). This screening involves a pelvic examination, including checking for microscopic changes to the surface of your cervix (Pap test). You may be encouraged to have this screening done every 3 years, beginning at age 64.  For women ages 92-65, health care providers may recommend pelvic exams and Pap testing every 3 years, or they may recommend the Pap and pelvic exam, combined with testing for human papilloma virus (HPV), every 5 years. Some types of HPV increase your risk of cervical cancer. Testing for HPV may also be done on women of any age with unclear Pap test results.  Other health care providers may not recommend any screening for nonpregnant women who are considered low risk for pelvic cancer and who do not have symptoms. Ask your health care provider if a screening pelvic exam is right for you.  If you have had past treatment for cervical cancer or a condition that could lead to cancer, you need Pap tests and screening for cancer for at least 20 years after your treatment. If Pap tests have been discontinued, your risk factors (such as having a new sexual partner) need to be reassessed to determine if screening should resume. Some women have medical problems that increase the chance of getting cervical cancer. In these cases, your health care provider may recommend more frequent screening and Pap tests. Colorectal Cancer  This type of cancer can be detected and often prevented.  Routine colorectal cancer screening usually begins at 80 years of age and continues through 81 years of age.  Your health care provider may recommend screening at an earlier age if you have risk factors for colon cancer.  Your health care provider may also  recommend using home test kits to check for hidden blood in the stool.  A small camera at the end of a tube can be used to examine your colon directly (sigmoidoscopy or colonoscopy). This is done to check for the earliest forms of colorectal cancer.  Routine screening usually begins at age 24.  Direct examination of the colon should be repeated every 5-10 years through 80 years of age. However, you may need to be screened more often if early forms of precancerous polyps or small growths are found. Skin Cancer  Check your skin from head to toe regularly.  Tell your health care provider about any new moles or changes in moles, especially if there is a change in a mole's shape or color.  Also tell your health care provider if you have a mole that is larger than the size of a pencil eraser.  Always use sunscreen. Apply sunscreen liberally and repeatedly throughout the day.  Protect yourself by wearing long sleeves, pants, a wide-brimmed hat, and sunglasses whenever you are outside. Heart disease, diabetes, and high blood  pressure  High blood pressure causes heart disease and increases the risk of stroke. High blood pressure is more likely to develop in: ? People who have blood pressure in the high end of the normal range (130-139/85-89 mm Hg). ? People who are overweight or obese. ? People who are African American.  If you are 83-21 years of age, have your blood pressure checked every 3-5 years. If you are 7 years of age or older, have your blood pressure checked every year. You should have your blood pressure measured twice-once when you are at a hospital or clinic, and once when you are not at a hospital or clinic. Record the average of the two measurements. To check your blood pressure when you are not at a hospital or clinic, you can use: ? An automated blood pressure machine at a pharmacy. ? A home blood pressure monitor.  If you are between 11 years and 80 years old, ask your health  care provider if you should take aspirin to prevent strokes.  Have regular diabetes screenings. This involves taking a blood sample to check your fasting blood sugar level. ? If you are at a normal weight and have a low risk for diabetes, have this test once every three years after 81 years of age. ? If you are overweight and have a high risk for diabetes, consider being tested at a younger age or more often. Preventing infection Hepatitis B  If you have a higher risk for hepatitis B, you should be screened for this virus. You are considered at high risk for hepatitis B if: ? You were born in a country where hepatitis B is common. Ask your health care provider which countries are considered high risk. ? Your parents were born in a high-risk country, and you have not been immunized against hepatitis B (hepatitis B vaccine). ? You have HIV or AIDS. ? You use needles to inject street drugs. ? You live with someone who has hepatitis B. ? You have had sex with someone who has hepatitis B. ? You get hemodialysis treatment. ? You take certain medicines for conditions, including cancer, organ transplantation, and autoimmune conditions. Hepatitis C  Blood testing is recommended for: ? Everyone born from [birth year] through 1963. ? Anyone with known risk factors for hepatitis C. Sexually transmitted infections (STIs)  You should be screened for sexually transmitted infections (STIs) including gonorrhea and chlamydia if: ? You are sexually active and are younger than 81 years of age. ? You are older than 81 years of age and your health care provider tells you that you are at risk for this type of infection. ? Your sexual activity has changed since you were last screened and you are at an increased risk for chlamydia or gonorrhea. Ask your health care provider if you are at risk.  If you do not have HIV, but are at risk, it may be recommended that you take a prescription medicine daily to prevent HIV  infection. This is called pre-exposure prophylaxis (PrEP). You are considered at risk if: ? You are sexually active and do not regularly use condoms or know the HIV status of your partner(s). ? You take drugs by injection. ? You are sexually active with a partner who has HIV. Talk with your health care provider about whether you are at high risk of being infected with HIV. If you choose to begin PrEP, you should first be tested for HIV. You should then be tested every 3 months for  as long as you are taking PrEP. Pregnancy  If you are premenopausal and you may become pregnant, ask your health care provider about preconception counseling.  If you may become pregnant, take 400 to 800 micrograms (mcg) of folic acid every day.  If you want to prevent pregnancy, talk to your health care provider about birth control (contraception). Osteoporosis and menopause  Osteoporosis is a disease in which the bones lose minerals and strength with aging. This can result in serious bone fractures. Your risk for osteoporosis can be identified using a bone density scan.  If you are 32 years of age or older, or if you are at risk for osteoporosis and fractures, ask your health care provider if you should be screened.  Ask your health care provider whether you should take a calcium or vitamin D supplement to lower your risk for osteoporosis.  Menopause may have certain physical symptoms and risks.  Hormone replacement therapy may reduce some of these symptoms and risks. Talk to your health care provider about whether hormone replacement therapy is right for you. Follow these instructions at home:  Schedule regular health, dental, and eye exams.  Stay current with your immunizations.  Do not use any tobacco products including cigarettes, chewing tobacco, or electronic cigarettes.  If you are pregnant, do not drink alcohol.  If you are breastfeeding, limit how much and how often you drink alcohol.  Limit  alcohol intake to no more than 1 drink per day for nonpregnant women. One drink equals 12 ounces of beer, 5 ounces of wine, or 1 ounces of hard liquor.  Do not use street drugs.  Do not share needles.  Ask your health care provider for help if you need support or information about quitting drugs.  Tell your health care provider if you often feel depressed.  Tell your health care provider if you have ever been abused or do not feel safe at home. This information is not intended to replace advice given to you by your health care provider. Make sure you discuss any questions you have with your health care provider. Document Released: 12/31/2010 Document Revised: 11/23/2015 Document Reviewed: 03/21/2015 Elsevier Interactive Patient Education  2019 [employer].

## 2018-06-25 NOTE — Progress Notes (Signed)
   Subjective:    Patient ID: Jacqueline [name], female    DOB: 1934/09/28, 83 y.o.   MRN: [id number]  HPI CPE- UTD on colonoscopy, mammo, immunizations.  No concerns today.   Review of Systems Patient reports no vision/ hearing changes, adenopathy,fever, weight change,  persistant/recurrent hoarseness , swallowing issues, chest pain, palpitations, edema, persistant/recurrent cough, hemoptysis, dyspnea (rest/exertional/paroxysmal nocturnal), gastrointestinal bleeding (melena, rectal bleeding), abdominal pain, significant heartburn, bowel changes, GU symptoms (dysuria, hematuria, incontinence), Gyn symptoms (abnormal  bleeding, pain),  syncope, focal weakness, memory loss, numbness & tingling, skin/hair/nail changes, abnormal bruising or bleeding, anxiety, or depression.     Objective:   Physical Exam General Appearance:    Alert, cooperative, no distress, appears stated age  Head:    Normocephalic, without obvious abnormality, atraumatic  Eyes:    PERRL, conjunctiva/corneas clear, EOM's intact, fundi    benign, both eyes  Ears:    Normal TM's and external ear canals, both ears  Nose:   Nares normal, septum midline, mucosa normal, no drainage    or sinus tenderness  Throat:   Lips, mucosa, and tongue normal; teeth and gums normal  Neck:   Supple, symmetrical, trachea midline, no adenopathy;    Thyroid: no enlargement/tenderness/nodules  Back:     Symmetric, no curvature, ROM normal, no CVA tenderness  Lungs:     Clear to auscultation bilaterally, respirations unlabored  Chest Wall:    No tenderness or deformity   Heart:    Regular rate and rhythm, S1 and S2 normal, no murmur, rub   or gallop  Breast Exam:    Deferred to mammo  Abdomen:     Soft, non-tender, bowel sounds active all four quadrants,    no masses, no organomegaly  Genitalia:    Deferred  Rectal:    Extremities:   Extremities normal, atraumatic, no cyanosis or edema  Pulses:   2+ and symmetric all extremities  Skin:    Skin color, texture, turgor normal, no rashes or lesions  Lymph nodes:   Cervical, supraclavicular, and axillary nodes normal  Neurologic:   CNII-XII intact, normal strength, sensation and reflexes    throughout          Assessment & Plan:

## 2018-06-25 NOTE — Assessment & Plan Note (Signed)
Pt's PE WNL and unchanged from previous.  UTD on mammo, immunizations, colonoscopy.  Check labs.  Anticipatory guidance provided.

## 2018-06-25 NOTE — Assessment & Plan Note (Signed)
Chronic problem.  Adequate control today.  She has not yet taken her medications b/c she doesn't like to take meds on an empty stomach.  Check labs.  No anticipated med changes.  Will follow.

## 2018-06-29 ENCOUNTER — Other Ambulatory Visit: Payer: Self-pay | Admitting: General Practice

## 2018-06-29 ENCOUNTER — Telehealth: Payer: Self-pay | Admitting: Family Medicine

## 2018-06-29 MED ORDER — VITAMIN D (ERGOCALCIFEROL) 1.25 MG (50000 UNIT) PO CAPS: 50000.0000 [IU] | ORAL_CAPSULE | ORAL | 0 refills | Status: DC

## 2018-06-29 NOTE — Telephone Encounter (Signed)
Copied from [place] [id number]. Topic: Quick Communication - See Telephone Encounter >> Jun 29, 2018  3:10 PM Ivar Drape wrote: CRM for notification. See Telephone encounter for: 06/29/18. Patient would like to know if the Vit D supplement she was recently advised to take is the same supplement she was put on last year because she had a terrible reaction to that one.  Also, she takes 72 IU's now.  Please advise.

## 2018-06-29 NOTE — Telephone Encounter (Signed)
Yes, the prescription is the same.  If she is not able to tolerate it, would increase her OTC supplement to 5,000 units daily and not take the prescription

## 2018-06-29 NOTE — Telephone Encounter (Signed)
Please advise 

## 2018-06-29 NOTE — Telephone Encounter (Signed)
Patient notified of PCP recommendations and is agreement and expresses an understanding.   Ok for P[facility] to Discuss results / PCP recommendations / Schedule patient.   

## 2018-07-01 ENCOUNTER — Other Ambulatory Visit: Payer: Self-pay | Admitting: Hematology and Oncology

## 2018-07-09 ENCOUNTER — Other Ambulatory Visit: Payer: Self-pay | Admitting: Family Medicine

## 2018-08-15 ENCOUNTER — Other Ambulatory Visit: Payer: Self-pay | Admitting: Family Medicine

## 2018-09-22 ENCOUNTER — Inpatient Hospital Stay: Payer: Medicare Other | Attending: Hematology and Oncology | Admitting: Hematology and Oncology

## 2018-09-22 ENCOUNTER — Telehealth: Payer: Self-pay | Admitting: Hematology and Oncology

## 2018-09-22 ENCOUNTER — Other Ambulatory Visit: Payer: Self-pay

## 2018-09-22 DIAGNOSIS — Z9013 Acquired absence of bilateral breasts and nipples: Secondary | ICD-10-CM

## 2018-09-22 DIAGNOSIS — Z79811 Long term (current) use of aromatase inhibitors: Secondary | ICD-10-CM | POA: Diagnosis not present

## 2018-09-22 DIAGNOSIS — Z170 Estrogen receptor positive status [ER+]: Secondary | ICD-10-CM | POA: Diagnosis not present

## 2018-09-22 DIAGNOSIS — C50411 Malignant neoplasm of upper-outer quadrant of right female breast: Secondary | ICD-10-CM | POA: Diagnosis not present

## 2018-09-22 NOTE — Progress Notes (Signed)
HEMATOLOGY-ONCOLOGY TELEPHONE VISIT PROGRESS NOTE  I connected with Jacqueline [name] on 09/22/18 at 10:15 AM EDT by telephone and verified that I am speaking with the correct person using two identifiers.  I discussed the limitations, risks, security and privacy concerns of performing an evaluation and management service by telephone and the availability of in person appointments.  I also discussed with the patient that there may be a patient responsible charge related to this service. The patient expressed understanding and agreed to proceed.   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  Did not take antiestrogen therapy      07/10/2017 Initial Diagnosis    7 mm right breast calcifications, stereotactic biopsy revealed invasive lobular cancer with LCIS ER 95%, PR 2%, Ki-67 5%, HER-2 negative ratio 1.31, T1 BN 0 stage I a clinical stage    08/06/2017 Genetic Testing    Negative genetic testing on the STAT cancer panel.  The STAT Breast cancer panel offered by [facility] includes sequencing and rearrangement analysis for the following 9 genes:  ATM, BRCA1, BRCA2, CDH1, CHEK2, PALB2, PTEN, STK11 and TP53.   The report date is July 28, 2017.  The report was reflexed to the common hereditary cancer panel.  Negative genetic testing on the common hereditary cancer panel was reported out on August 07, 2107.  The Hereditary Gene Panel offered by [facility] includes sequencing  and/or deletion duplication testing of the following 47 genes: APC, ATM, AXIN2, BARD1, BMPR1A, BRCA1, BRCA2, BRIP1, CDH1, CDK4, CDKN2A (p14ARF), CDKN2A (p16INK4a), CHEK2, CTNNA1, DICER1, EPCAM (Deletion/duplication testing only), GREM1 (promoter region deletion/duplication testing only), KIT, MEN1, MLH1, MSH2, MSH3, MSH6, MUTYH, NBN, NF1, NHTL1, PALB2, PDGFRA, PMS2, POLD1, POLE, PTEN, RAD50, RAD51C, RAD51D, SDHB, SDHC, SDHD, SMAD4, SMARCA4. STK11, TP53, TSC1, TSC2, and VHL.  The following genes were evaluated for sequence changes only: SDHA and HOXB13 c.251G>[id number] variant only.      09/04/2017 Surgery    Right mastectomy: Invasive lobular cancer with LCIS, 2.1 cm, grade 1, DCIS, margins negative, 0/3 lymph nodes negative ER 95%, PR 2%, HER-2 negative ratio 1.31, Ki-67 5%, T2N0 stage Ib Left mastectomy: IDC with DCIS 2.3 cm, grade 2, margins negative, 0/3 lymph nodes negative ER 100%, PR 40%, HER-2 negative ratio 1.55, Ki-67 40%, T2N0 stage Ib    08/2017 -  Anti-estrogen oral therapy    Letrozole switched to Aromasin and then switched to anastrozole 03/25/2018 due to hot flashes and depression     Observations/Objective: Patient reports that half a tablet of anastrozole she did not have terrible hot flashes but when she went to full tablet hot flashes got markedly worse.  Because of this she went back to taking half a tablet and she appears to be doing much better.  She was also having insomnia and full tablet.  She denies any pain or discomfort in the chest wall or axilla.    Assessment Plan:  Malignant neoplasm of upper-outer quadrant of right breast in female,  estrogen receptor positive ([place]) Bilateral mastectomies 09/04/2017: Right mastectomy: Invasive lobular cancer with LCIS, 2.1 cm, grade 1, DCIS, margins negative, 0/3 lymph nodes negative ER 95%, PR 2%, HER-2 negative ratio 1.31, Ki-67 5%, T2N0 stage Ib Left mastectomy: IDC with DCIS 2.3 cm, grade 2, margins negative, 0/3 lymph nodes negative ER  100%, PR 40%, HER-2 negative ratio 1.55, Ki-67 40%, T2N0 stage Ib  Current treatment: Antiestrogen therapy with letrozole 2.5 mg daily, switched to exemestane June 2019 and switched to anastrozole September 2019 due to hot flashes and memory/depression problems  Anastrozole toxicities: Patient will remain at half a tablet of anastrozole daily.  At this dose she is not having any major hot flashes.  Breast cancer surveillance: 1.  Breast exam: 12/12/2017: Benign 2. mammogram: No role of mammogram since she had bilateral mastectomies  Return to clinic in 1 year  I discussed the assessment and treatment plan with the patient. The patient was provided an opportunity to ask questions and all were answered. The patient agreed with the plan and demonstrated an understanding of the instructions. The patient was advised to call back or seek an in-person evaluation if the symptoms worsen or if the condition fails to improve as anticipated.   I provided 10 minutes of non-face-to-face time during this encounter. Harriette Ohara, MD

## 2018-09-22 NOTE — Telephone Encounter (Signed)
Scheduled appt per 3/24 sch message - sent reminder letter in the mail.

## 2018-09-22 NOTE — Assessment & Plan Note (Signed)
Bilateral mastectomies 09/04/2017: Right mastectomy: Invasive lobular cancer with LCIS, 2.1 cm, grade 1, DCIS, margins negative, 0/3 lymph nodes negative ER 95%, PR 2%, HER-2 negative ratio 1.31, Ki-67 5%, T2N0 stage Ib Left mastectomy: IDC with DCIS 2.3 cm, grade 2, margins negative, 0/3 lymph nodes negative ER 100%, PR 40%, HER-2 negative ratio 1.55, Ki-67 40%, T2N0 stage Ib  Recommendation: Antiestrogen therapy with letrozole 2.5 mg daily, switched to exemestane June 2019 and switched to anastrozole September 2019 due to hot flashes and memory/depression problems  Anastrozole toxicities:  Breast cancer surveillance: 1.  Breast exam: 12/12/2017: Benign 2. mammogram: No role of mammogram since she had bilateral mastectomies  Return to clinic in 1 year

## 2018-10-01 ENCOUNTER — Other Ambulatory Visit: Payer: Self-pay | Admitting: Family Medicine

## 2018-10-15 ENCOUNTER — Other Ambulatory Visit: Payer: Self-pay | Admitting: Hematology and Oncology

## 2018-11-10 ENCOUNTER — Other Ambulatory Visit: Payer: Self-pay | Admitting: Family Medicine

## 2018-11-13 ENCOUNTER — Other Ambulatory Visit: Payer: Self-pay

## 2018-11-13 ENCOUNTER — Encounter: Payer: Self-pay | Admitting: Family Medicine

## 2018-11-13 ENCOUNTER — Ambulatory Visit (INDEPENDENT_AMBULATORY_CARE_PROVIDER_SITE_OTHER): Payer: Medicare Other | Admitting: Family Medicine

## 2018-11-13 VITALS — BP 136/66 | Wt 160.0 lb

## 2018-11-13 DIAGNOSIS — I10 Essential (primary) hypertension: Secondary | ICD-10-CM

## 2018-11-13 NOTE — Progress Notes (Signed)
I have discussed the procedure for the virtual visit with the patient who has given consent to proceed with assessment and treatment.   Wednesday 10am 137/70 10pm 166/70 Thursday at 2pm 148/73, 11:30 pm 143/69 Today at 10am 162/70  Ortho gave her a steroid injection yesterday   Davis Gourd, CMA

## 2018-11-13 NOTE — Progress Notes (Signed)
Virtual Visit via Video   I connected with patient on 11/13/18 at  2:40 PM EDT by a video enabled telemedicine application and verified that I am speaking with the correct person using two identifiers.  Location patient: Home Location provider: Acup[facility]urist, Office Persons participating in the virtual visit: Patient, Provider, [place] (Jess B)  I discussed the limitations of evaluation and management by telemedicine and the availability of in person appointments. The patient expressed understanding and agreed to proceed.  Interactive audio and video telecommunications were attempted between this provider and patient, however failed, due to patient having technical difficulties OR patient did not have access to video capability.  We continued and completed visit with audio only.   Subjective:   HPI:  HTN- pt reports she hasn't 'felt like myself' for the past few days.  Had a HA on Wednesday so decided to take her BP.  Has had BPs ranging from 130s-160s/70s.  Pt has had increased shoulder pain recently.  Was taking Ibuprofen.  Saw Ortho and was given a steroid shot.  Did not take Ibuprofen today and BP is better.  Currently on Amlodipine '5mg'$  daily, Olmesartan '40mg'$  daily.  Walking daily.  No CP, SOB, visual changes, edema.  ROS:   See pertinent positives and negatives per HPI.  Patient Active Problem List   Diagnosis Date Noted  . Malignant neoplasm of upper-outer quadrant of left breast in female, estrogen receptor positive ([place]) 09/17/2017  . Genetic testing 07/29/2017  . Malignant neoplasm of upper-outer quadrant of right breast in female, estrogen receptor positive ([place]) 07/22/2017  . Family history of breast cancer   . Family history of pancreatic cancer   . Vitamin D deficiency 07/17/2017  . Memory loss 06/03/2017  . Lobular carcinoma in situ (LCIS) of right breast 07/29/2016  . Physical exam 05/17/2016  . Essential hypertension, benign 09/13/2015  . Incontinence  01/30/2015  . GERD 04/12/2008  . Hyperlipidemia 03/04/2007  . DYSFUNCTION, SLEEP STATE NEC 03/04/2007  . Osteopenia 03/04/2007  . Hx of Breast cancer ([facility]), Left,Stage I Receptor +, Her2 - 04/07/2006    Class: Stage 1    Social History   Tobacco Use  . Smoking status: Never Smoker  . Smokeless tobacco: Never Used  Substance Use Topics  . Alcohol use: No    Alcohol/week: 0.0 standard drinks    Current Outpatient Medications:  .  acetaminophen (TYLENOL) 500 MG tablet, Take 1,000 mg by mouth daily as needed for moderate pain or headache., Disp: , Rfl:  .  amLODipine (NORVASC) 5 MG tablet, TAKE 1 TABLET BY MOUTH EVERY DAY, Disp: 90 tablet, Rfl: 0 .  anastrozole (ARIMIDEX) 1 MG tablet, TAKE 1 TABLET BY MOUTH EVERY DAY, Disp: 90 tablet, Rfl: 0 .  cholecalciferol (VITAMIN D) 1000 units tablet, Take 2,000 Units by mouth every evening., Disp: , Rfl:  .  clobetasol ointment (TEMOVATE) 0.05 %, , Disp: , Rfl:  .  diclofenac sodium (VOLTAREN) 1 % GEL, apply topically once daily as needed for pain, Disp: , Rfl: 1 .  ibuprofen (ADVIL,MOTRIN) 200 MG tablet, Take 400 mg by mouth daily as needed for headache or moderate pain., Disp: , Rfl:  .  latanoprost (XALATAN) 0.005 % ophthalmic solution, Place 1 drop into both eyes at bedtime., Disp: , Rfl:  .  loperamide (IMODIUM) 2 MG capsule, Take 3 mg by mouth as needed for diarrhea or loose stools. , Disp: , Rfl:  .  mirtazapine (REMERON) 15 MG tablet, Take 1 tablet (15  mg total) by mouth at bedtime., Disp: 90 tablet, Rfl: 1 .  Multiple Vitamins-Minerals (PRESERVISION AREDS 2) CAPS, Take 1 capsule by mouth 2 (two) times daily., Disp: , Rfl:  .  nystatin ointment (MYCOSTATIN), Apply 1 application topically every evening. , Disp: , Rfl:  .  olmesartan (BENICAR) 40 MG tablet, TAKE 1 TABLET BY MOUTH EVERY DAY, Disp: 90 tablet, Rfl: 1 .  omeprazole (PRILOSEC OTC) 20 MG tablet, Take 1 tablet (20 mg total) by mouth daily., Disp: 90 tablet, Rfl: 4 .  QUEtiapine  (SEROQUEL) 25 MG tablet, TAKE 1 TABLET BY MOUTH EVERYDAY AT BEDTIME, Disp: 90 tablet, Rfl: 0 .  TAZORAC 0.05 % cream, APPLY TO AFFECTED AREA IN THE EVENING, Disp: , Rfl: 1  Allergies  Allergen Reactions  . Aspirin Nausea Only    stomach upset  . Codeine Sulfate Nausea Only    stomach upset  . Hydrocodone-Acetaminophen Nausea And Vomiting  . Lactose Intolerance (Gi) Nausea And Vomiting    Stomach upset     Objective:   BP 136/66   Wt 160 lb (72.6 kg)   BMI 28.34 kg/m  Pt is able to speak clearly, coherently without shortness of breath or increased work of breathing.  Thought process is linear.  Mood is appropriate.   Assessment and Plan:   HTN- Deteriorated.  pt's BP has been mildly elevated for last few days but this corresponds to increased NSAIDs intake and subsequent steroid injxn.  Today's reading is much better.  Her HA has resolved.  Otherwise asymptomatic.  She will monitor BP at home for 2-3 weeks and we will then adjust meds prn.  Pt expressed understanding and is in agreement w/ plan.    Annye Asa, MD 11/13/2018

## 2018-11-19 ENCOUNTER — Other Ambulatory Visit (INDEPENDENT_AMBULATORY_CARE_PROVIDER_SITE_OTHER): Payer: Medicare Other

## 2018-11-19 ENCOUNTER — Other Ambulatory Visit: Payer: Self-pay

## 2018-11-19 DIAGNOSIS — I10 Essential (primary) hypertension: Secondary | ICD-10-CM

## 2018-11-19 LAB — CBC WITH DIFFERENTIAL/PLATELET
Basophils Absolute: 0 10*3/uL (ref 0.0–0.1)
Basophils Relative: 0.4 % (ref 0.0–3.0)
Eosinophils Absolute: 0.1 10*3/uL (ref 0.0–0.7)
Eosinophils Relative: 0.9 % (ref 0.0–5.0)
HCT: 39.5 % (ref 36.0–46.0)
Hemoglobin: 13.5 g/dL (ref 12.0–15.0)
Lymphocytes Relative: 26.5 % (ref 12.0–46.0)
Lymphs Abs: 2.5 10*3/uL (ref 0.7–4.0)
MCHC: 34.3 g/dL (ref 30.0–36.0)
MCV: 90.6 fl (ref 78.0–100.0)
Monocytes Absolute: 0.7 10*3/uL (ref 0.1–1.0)
Monocytes Relative: 7.5 % (ref 3.0–12.0)
Neutro Abs: 6 10*3/uL (ref 1.4–7.7)
Neutrophils Relative %: 64.7 % (ref 43.0–77.0)
Platelets: 197 10*3/uL (ref 150.0–400.0)
RBC: 4.36 Mil/uL (ref 3.87–5.11)
RDW: 13.1 % (ref 11.5–15.5)
WBC: 9.3 10*3/uL (ref 4.0–10.5)

## 2018-11-19 LAB — BASIC METABOLIC PANEL
BUN: 16 mg/dL (ref 6–23)
CO2: 27 mEq/L (ref 19–32)
Calcium: 8.9 mg/dL (ref 8.4–10.5)
Chloride: 107 mEq/L (ref 96–112)
Creatinine, Ser: 1.09 mg/dL (ref 0.40–1.20)
GFR: 48.07 mL/min — ABNORMAL LOW (ref >60.00)
Glucose, Bld: 97 mg/dL (ref 70–99)
Potassium: 3.7 mEq/L (ref 3.5–5.1)
Sodium: 142 mEq/L (ref 135–145)

## 2018-11-27 ENCOUNTER — Encounter: Payer: Self-pay | Admitting: Family Medicine

## 2018-11-27 ENCOUNTER — Other Ambulatory Visit: Payer: Self-pay | Admitting: Family Medicine

## 2018-11-27 ENCOUNTER — Other Ambulatory Visit: Payer: Self-pay

## 2018-11-27 ENCOUNTER — Ambulatory Visit (INDEPENDENT_AMBULATORY_CARE_PROVIDER_SITE_OTHER): Payer: Medicare Other | Admitting: Family Medicine

## 2018-11-27 VITALS — BP 127/59 | Ht 63.0 in | Wt 160.0 lb

## 2018-11-27 DIAGNOSIS — I10 Essential (primary) hypertension: Secondary | ICD-10-CM

## 2018-11-27 NOTE — Progress Notes (Signed)
Virtual Visit via Video   I connected with patient on 11/27/18 at 10:00 AM EDT by a video enabled telemedicine application and verified that I am speaking with the correct person using two identifiers.  Location patient: Home Location provider: [facility], Office Persons participating in the virtual visit: Patient, Provider, Saline Romelle Starcher D)  I discussed the limitations of evaluation and management by telemedicine and the availability of in person appointments. The patient expressed understanding and agreed to proceed.  Subjective:   HPI:   HTN- chronic problem, on Amlodipine '5mg'$  daily,Olmesartan '40mg'$  daily.  BP is well controlled today.  'I can tell a big difference'.  No CP, SOB, HAs, visual changes, edema.  ROS:   See pertinent positives and negatives per HPI.  Patient Active Problem List   Diagnosis Date Noted  . Malignant neoplasm of upper-outer quadrant of left breast in female, estrogen receptor positive ([place]) 09/17/2017  . Genetic testing 07/29/2017  . Malignant neoplasm of upper-outer quadrant of right breast in female, estrogen receptor positive ([place]) 07/22/2017  . Family history of breast cancer   . Family history of pancreatic cancer   . Vitamin D deficiency 07/17/2017  . Memory loss 06/03/2017  . Lobular carcinoma in situ (LCIS) of right breast 07/29/2016  . Physical exam 05/17/2016  . Essential hypertension, benign 09/13/2015  . Incontinence 01/30/2015  . GERD 04/12/2008  . Hyperlipidemia 03/04/2007  . DYSFUNCTION, SLEEP STATE NEC 03/04/2007  . Osteopenia 03/04/2007  . Hx of Breast cancer ([facility]), Left,Stage I Receptor +, Her2 - 04/07/2006    Class: Stage 1    Social History   Tobacco Use  . Smoking status: Never Smoker  . Smokeless tobacco: Never Used  Substance Use Topics  . Alcohol use: No    Alcohol/week: 0.0 standard drinks    Current Outpatient Medications:  .  amLODipine (NORVASC) 5 MG tablet, TAKE 1 TABLET BY MOUTH EVERY DAY, Disp:  90 tablet, Rfl: 0 .  anastrozole (ARIMIDEX) 1 MG tablet, TAKE 1 TABLET BY MOUTH EVERY DAY, Disp: 90 tablet, Rfl: 0 .  cholecalciferol (VITAMIN D) 1000 units tablet, Take 2,000 Units by mouth every evening., Disp: , Rfl:  .  clobetasol ointment (TEMOVATE) 0.05 %, , Disp: , Rfl:  .  diclofenac sodium (VOLTAREN) 1 % GEL, apply topically once daily as needed for pain, Disp: , Rfl: 1 .  ibuprofen (ADVIL,MOTRIN) 200 MG tablet, Take 400 mg by mouth daily as needed for headache or moderate pain., Disp: , Rfl:  .  latanoprost (XALATAN) 0.005 % ophthalmic solution, Place 1 drop into both eyes at bedtime., Disp: , Rfl:  .  loperamide (IMODIUM) 2 MG capsule, Take 3 mg by mouth as needed for diarrhea or loose stools. , Disp: , Rfl:  .  mirtazapine (REMERON) 15 MG tablet, Take 1 tablet (15 mg total) by mouth at bedtime., Disp: 90 tablet, Rfl: 1 .  Multiple Vitamins-Minerals (PRESERVISION AREDS 2) CAPS, Take 1 capsule by mouth 2 (two) times daily., Disp: , Rfl:  .  nystatin ointment (MYCOSTATIN), Apply 1 application topically every evening. , Disp: , Rfl:  .  olmesartan (BENICAR) 40 MG tablet, TAKE 1 TABLET BY MOUTH EVERY DAY, Disp: 90 tablet, Rfl: 1 .  omeprazole (PRILOSEC OTC) 20 MG tablet, Take 1 tablet (20 mg total) by mouth daily., Disp: 90 tablet, Rfl: 4 .  QUEtiapine (SEROQUEL) 25 MG tablet, TAKE 1 TABLET BY MOUTH EVERYDAY AT BEDTIME, Disp: 90 tablet, Rfl: 0 .  TAZORAC 0.05 % cream, APPLY  TO AFFECTED AREA IN THE EVENING, Disp: , Rfl: 1 .  acetaminophen (TYLENOL) 500 MG tablet, Take 1,000 mg by mouth daily as needed for moderate pain or headache., Disp: , Rfl:   Allergies  Allergen Reactions  . Aspirin Nausea Only    stomach upset  . Codeine Sulfate Nausea Only    stomach upset  . Hydrocodone-Acetaminophen Nausea And Vomiting  . Lactose Intolerance (Gi) Nausea And Vomiting    Stomach upset     Objective:   BP (!) 127/59   Ht '5\' 3"'$  (1.6 m)   Wt 160 lb (72.6 kg)   BMI 28.34 kg/m   AAOx3, NAD  NCAT, EOMI No obvious CN deficits Coloring WNL Pt is able to speak clearly, coherently without shortness of breath or increased work of breathing.  Thought process is linear.  Mood is appropriate.   Assessment and Plan:   HTN- improved.  Pt reports BP has normalized and she is feeling much better. Reviewed recent labs.  No changes at this time.  Will follow.  Annye Asa, MD 11/27/2018

## 2018-11-27 NOTE — Progress Notes (Signed)
I have discussed the procedure for the virtual visit with the patient who has given consent to proceed with assessment and treatment.   Terrie Haring, CMA     

## 2018-12-04 DIAGNOSIS — M542 Cervicalgia: Secondary | ICD-10-CM | POA: Insufficient documentation

## 2018-12-10 ENCOUNTER — Other Ambulatory Visit: Payer: Self-pay | Admitting: Family Medicine

## 2019-01-04 ENCOUNTER — Other Ambulatory Visit: Payer: Self-pay | Admitting: Family Medicine

## 2019-02-01 ENCOUNTER — Other Ambulatory Visit: Payer: Self-pay | Admitting: Family Medicine

## 2019-03-27 ENCOUNTER — Other Ambulatory Visit: Payer: Self-pay | Admitting: Family Medicine

## 2019-04-22 ENCOUNTER — Telehealth: Payer: Self-pay | Admitting: Family Medicine

## 2019-04-22 ENCOUNTER — Telehealth: Payer: Self-pay

## 2019-04-22 MED ORDER — MIRTAZAPINE 15 MG PO TABS: 15.0000 mg | ORAL_TABLET | Freq: Every day | ORAL | 0 refills | Status: DC

## 2019-04-22 NOTE — Telephone Encounter (Signed)
Due to some miscommunication with Mail order, pt states that she is out of the Mirtazapine 15mg , she wanted to know if Dr. Birdie Riddle would send in a half of a script to the [facility] in [place]. She took her last pill last night. Pt can be reached at the home # and we can LM if no answer.

## 2019-04-22 NOTE — Telephone Encounter (Signed)
Ok for refill? 

## 2019-04-22 NOTE — Addendum Note (Signed)
Addended by: Davis Gourd on: 04/22/2019 04:28 PM   Modules accepted: Orders

## 2019-04-22 NOTE — Telephone Encounter (Signed)
Medication filled to pharmacy as requested.   

## 2019-04-22 NOTE — Telephone Encounter (Signed)
[place] for [employer] #30 to [facility]

## 2019-04-22 NOTE — Telephone Encounter (Signed)
Patient called in stating that she needs a partial fill on her REMERON 15 mg. Mail order fill will not arrive at her house until beginning of next week. Please advise

## 2019-04-28 NOTE — Telephone Encounter (Signed)
Was this refilled?

## 2019-04-28 NOTE — Telephone Encounter (Signed)
Appears to have been refilled on 10/22

## 2019-04-29 ENCOUNTER — Other Ambulatory Visit: Payer: Self-pay | Admitting: Family Medicine

## 2019-05-14 ENCOUNTER — Other Ambulatory Visit: Payer: Self-pay | Admitting: Family Medicine

## 2019-06-02 ENCOUNTER — Other Ambulatory Visit: Payer: Self-pay | Admitting: Family Medicine

## 2019-06-02 ENCOUNTER — Other Ambulatory Visit: Payer: Self-pay | Admitting: Hematology and Oncology

## 2019-06-07 ENCOUNTER — Other Ambulatory Visit: Payer: Self-pay | Admitting: Family Medicine

## 2019-06-12 ENCOUNTER — Other Ambulatory Visit: Payer: Self-pay | Admitting: Family Medicine

## 2019-06-28 ENCOUNTER — Encounter: Payer: Medicare Other | Admitting: Family Medicine

## 2019-06-29 ENCOUNTER — Other Ambulatory Visit: Payer: Self-pay

## 2019-06-29 ENCOUNTER — Ambulatory Visit (INDEPENDENT_AMBULATORY_CARE_PROVIDER_SITE_OTHER): Payer: Medicare Other | Admitting: Family Medicine

## 2019-06-29 ENCOUNTER — Encounter: Payer: Self-pay | Admitting: Family Medicine

## 2019-06-29 VITALS — BP 120/72 | HR 69 | Temp 98.0°F | Resp 16 | Ht 63.0 in | Wt 161.1 lb

## 2019-06-29 DIAGNOSIS — Z170 Estrogen receptor positive status [ER+]: Secondary | ICD-10-CM

## 2019-06-29 DIAGNOSIS — E559 Vitamin D deficiency, unspecified: Secondary | ICD-10-CM | POA: Diagnosis not present

## 2019-06-29 DIAGNOSIS — C50411 Malignant neoplasm of upper-outer quadrant of right female breast: Secondary | ICD-10-CM

## 2019-06-29 DIAGNOSIS — Z0000 Encounter for general adult medical examination without abnormal findings: Secondary | ICD-10-CM

## 2019-06-29 DIAGNOSIS — I10 Essential (primary) hypertension: Secondary | ICD-10-CM

## 2019-06-29 LAB — HEPATIC FUNCTION PANEL
ALT: 19 U/L (ref 0–35)
AST: 18 U/L (ref 0–37)
Albumin: 4 g/dL (ref 3.5–5.2)
Alkaline Phosphatase: 113 U/L (ref 39–117)
Bilirubin, Direct: 0.1 mg/dL (ref 0.0–0.3)
Total Bilirubin: 0.4 mg/dL (ref 0.2–1.2)
Total Protein: 6.9 g/dL (ref 6.0–8.3)

## 2019-06-29 LAB — BASIC METABOLIC PANEL
BUN: 20 mg/dL (ref 6–23)
CO2: 26 mEq/L (ref 19–32)
Calcium: 9.4 mg/dL (ref 8.4–10.5)
Chloride: 108 mEq/L (ref 96–112)
Creatinine, Ser: 0.97 mg/dL (ref 0.40–1.20)
GFR: 54.91 mL/min — ABNORMAL LOW (ref >60.00)
Glucose, Bld: 82 mg/dL (ref 70–99)
Potassium: 3.5 mEq/L (ref 3.5–5.1)
Sodium: 141 mEq/L (ref 135–145)

## 2019-06-29 LAB — LIPID PANEL
Cholesterol: 220 mg/dL — ABNORMAL HIGH (ref 0–200)
HDL: 38.8 mg/dL — ABNORMAL LOW (ref >39.00)
NonHDL: 180.73
Total CHOL/HDL Ratio: 6
Triglycerides: 226 mg/dL — ABNORMAL HIGH (ref 0.0–149.0)
VLDL: 45.2 mg/dL — ABNORMAL HIGH (ref 0.0–40.0)

## 2019-06-29 LAB — CBC WITH DIFFERENTIAL/PLATELET
Basophils Absolute: 0 10*3/uL (ref 0.0–0.1)
Basophils Relative: 0.4 % (ref 0.0–3.0)
Eosinophils Absolute: 0.1 10*3/uL (ref 0.0–0.7)
Eosinophils Relative: 1.8 % (ref 0.0–5.0)
HCT: 39.3 % (ref 36.0–46.0)
Hemoglobin: 12.8 g/dL (ref 12.0–15.0)
Lymphocytes Relative: 23.5 % (ref 12.0–46.0)
Lymphs Abs: 1.5 10*3/uL (ref 0.7–4.0)
MCHC: 32.7 g/dL (ref 30.0–36.0)
MCV: 91 fl (ref 78.0–100.0)
Monocytes Absolute: 0.6 10*3/uL (ref 0.1–1.0)
Monocytes Relative: 8.9 % (ref 3.0–12.0)
Neutro Abs: 4.2 10*3/uL (ref 1.4–7.7)
Neutrophils Relative %: 65.4 % (ref 43.0–77.0)
Platelets: 180 10*3/uL (ref 150.0–400.0)
RBC: 4.32 Mil/uL (ref 3.87–5.11)
RDW: 13.3 % (ref 11.5–15.5)
WBC: 6.5 10*3/uL (ref 4.0–10.5)

## 2019-06-29 LAB — VITAMIN D 25 HYDROXY (VIT D DEFICIENCY, FRACTURES): VITD: 22.15 ng/mL — ABNORMAL LOW (ref 30.00–100.00)

## 2019-06-29 LAB — LDL CHOLESTEROL, DIRECT: Direct LDL: 139 mg/dL

## 2019-06-29 LAB — TSH: TSH: 1.44 u[IU]/mL (ref 0.35–4.50)

## 2019-06-29 NOTE — Assessment & Plan Note (Signed)
Pt's PE WNL w/ exception of cerumen impaction which was addressed today.  No need for mammo due to mastectomy, no need for colonoscopy due to age.  UTD on immunizations.  Check labs.  Anticipatory guidance provided on diet, exercise, cancer screenings.

## 2019-06-29 NOTE — Assessment & Plan Note (Signed)
Continues on Arimidex w/ Dr Lindi Adie

## 2019-06-29 NOTE — Patient Instructions (Addendum)
Follow up in 6 months to recheck BP and cholesterol We'll notify you of your lab results and make any changes if needed Continue to work on healthy diet and regular exercise- you look great! Call with any questions or concerns Stay Safe!  Stay healthy! Happy New Year!!   Preventive Care 14 Years and Older, Female Preventive care refers to lifestyle choices and visits with your health care provider that can promote health and wellness. This includes:  A yearly physical exam. This is also called an annual well check.  Regular dental and eye exams.  Immunizations.  Screening for certain conditions.  Healthy lifestyle choices, such as diet and exercise. What can I expect for my preventive care visit? Physical exam Your health care provider will check:  Height and weight. These may be used to calculate body mass index (BMI), which is a measurement that tells if you are at a healthy weight.  Heart rate and blood pressure.  Your skin for abnormal spots. Counseling Your health care provider may ask you questions about:  Alcohol, tobacco, and drug use.  Emotional well-being.  Home and relationship well-being.  Sexual activity.  Eating habits.  History of falls.  Memory and ability to understand (cognition).  Work and work Statistician.  Pregnancy and menstrual history. What immunizations do I need?  Influenza (flu) vaccine  This is recommended every year. Tetanus, diphtheria, and pertussis (Tdap) vaccine  You may need a Td booster every 10 years. Varicella (chickenpox) vaccine  You may need this vaccine if you have not already been vaccinated. Zoster (shingles) vaccine  You may need this after age 77. Pneumococcal conjugate (PCV13) vaccine  One dose is recommended after age 27. Pneumococcal polysaccharide (PPSV23) vaccine  One dose is recommended after age 44. Measles, mumps, and rubella (MMR) vaccine  You may need at least one dose of MMR if you were born  in 1957 or later. You may also need a second dose. Meningococcal conjugate (MenACWY) vaccine  You may need this if you have certain conditions. Hepatitis A vaccine  You may need this if you have certain conditions or if you travel or work in places where you may be exposed to hepatitis A. Hepatitis B vaccine  You may need this if you have certain conditions or if you travel or work in places where you may be exposed to hepatitis B. Haemophilus influenzae type b (Hib) vaccine  You may need this if you have certain conditions. You may receive vaccines as individual doses or as more than one vaccine together in one shot (combination vaccines). Talk with your health care provider about the risks and benefits of combination vaccines. What tests do I need? Blood tests  Lipid and cholesterol levels. These may be checked every 5 years, or more frequently depending on your overall health.  Hepatitis C test.  Hepatitis B test. Screening  Lung cancer screening. You may have this screening every year starting at age 57 if you have a 30-pack-year history of smoking and currently smoke or have quit within the past 15 years.  Colorectal cancer screening. All adults should have this screening starting at age 39 and continuing until age 20. Your health care provider may recommend screening at age 49 if you are at increased risk. You will have tests every 1-10 years, depending on your results and the type of screening test.  Diabetes screening. This is done by checking your blood sugar (glucose) after you have not eaten for a while (fasting). You  may have this done every 1-3 years.  Mammogram. This may be done every 1-2 years. Talk with your health care provider about how often you should have regular mammograms.  BRCA-related cancer screening. This may be done if you have a family history of breast, ovarian, tubal, or peritoneal cancers. Other tests  Sexually transmitted disease (STD)  testing.  Bone density scan. This is done to screen for osteoporosis. You may have this done starting at age 75. Follow these instructions at home: Eating and drinking  Eat a diet that includes fresh fruits and vegetables, whole grains, lean protein, and low-fat dairy products. Limit your intake of foods with high amounts of sugar, saturated fats, and salt.  Take vitamin and mineral supplements as recommended by your health care provider.  Do not drink alcohol if your health care provider tells you not to drink.  If you drink alcohol: ? Limit how much you have to 0-1 drink a day. ? Be aware of how much alcohol is in your drink. In the [place] one drink equals one 12 oz bottle of beer (355 mL), one 5 oz glass of wine (148 mL), or one 1 oz glass of hard liquor (44 mL). Lifestyle  Take daily care of your teeth and gums.  Stay active. Exercise for at least 30 minutes on 5 or more days each week.  Do not use any products that contain nicotine or tobacco, such as cigarettes, e-cigarettes, and chewing tobacco. If you need help quitting, ask your health care provider.  If you are sexually active, practice safe sex. Use a condom or other form of protection in order to prevent STIs (sexually transmitted infections).  Talk with your health care provider about taking a low-dose aspirin or statin. What's next?  Go to your health care provider once a year for a well check visit.  Ask your health care provider how often you should have your eyes and teeth checked.  Stay up to date on all vaccines. This information is not intended to replace advice given to you by your health care provider. Make sure you discuss any questions you have with your health care provider. Document Released: 07/14/2015 Document Revised: 06/11/2018 Document Reviewed: 06/11/2018 Elsevier Patient Education  2020 [employer].

## 2019-06-29 NOTE — Progress Notes (Addendum)
Subjective:    Patient ID: Jacqueline [name], female    DOB: 1934/12/12, 84 y.o.   MRN: [id number]  HPI Here today for CPE and MWV  Risk Factors: HTN- chronic problem, on Amlodipine 5mg  daily and Olmesartan 40mg  daily Breast cancer- remains on Arimidex, follows w/ Dr Lindi Adie Physical Activity: was exercising regularly w/ bands at home but has taken the last month off Fall Risk: low Depression: denies current sxs, on Remeron and Seroquel Hearing: normal to conversational tones, mildly decreased to whispered voice ADL's: independent Cognitive: normal linear thought process, memory and attention intact Home Safety: safe at home, lives w/ husband Iona Beard Height, Weight, BMI, Visual Acuity: see vitals, vision corrected to 20/20 w/ glasses Counseling: no need for mammo due to mastectomy, no need for colonoscopy due to age, UTD on immunizations. Labs Ordered: See A&P Care Plan: See A&P   Patient Care Team    Relationship Specialty Notifications Start End  Midge Minium, MD PCP - General Family Medicine  11/28/15   Luberta Mutter, MD Consulting Physician Ophthalmology  05/17/16   Kristeen Miss, MD Consulting Physician Neurosurgery  02/26/17   Elsie Saas, MD Consulting Physician Orthopedic Surgery  05/28/17   Jari Pigg, MD Consulting Physician Dermatology  05/28/17   Jovita Kussmaul, MD Consulting Physician General Surgery  12/10/17   Nicholas Lose, MD Consulting Physician Hematology and Oncology  12/10/17   Gardenia Phlegm, NP Nurse Practitioner Hematology and Oncology  12/10/17    Health Maintenance  Topic Date Due  . Samul Dada  06/08/2022  . INFLUENZA VACCINE  Completed  . DEXA SCAN  Completed  . PNA vac Low Risk Adult  Completed      Review of Systems Patient reports no vision/ hearing changes, adenopathy,fever, weight change,  persistant/recurrent hoarseness , swallowing issues, chest pain, palpitations, edema, persistant/recurrent cough, hemoptysis,  dyspnea (rest/exertional/paroxysmal nocturnal), gastrointestinal bleeding (melena, rectal bleeding), abdominal pain, significant heartburn, bowel changes, GU symptoms (dysuria, hematuria, incontinence), Gyn symptoms (abnormal  bleeding, pain),  syncope, focal weakness, memory loss, skin/hair/nail changes, abnormal bruising or bleeding, anxiety, or depression.   + numbness of feet  This visit occurred during the SARS-CoV-2 public health emergency.  Safety protocols were in place, including screening questions prior to the visit, additional usage of staff PPE, and extensive cleaning of exam room while observing appropriate contact time as indicated for disinfecting solutions.       Objective:   Physical Exam General Appearance:    Alert, cooperative, no distress, appears stated age  Head:    Normocephalic, without obvious abnormality, atraumatic  Eyes:    PERRL, conjunctiva/corneas clear, EOM's intact, fundi    benign, both eyes  Ears:    TM's obscured due to wax- pt consented for removal and wax was successfully removed w/ lighted curette.  Tolerated w/o difficulty.  Normal external ear canals, both ears  Nose:   Deferred due to COVID  Throat:   Neck:   Supple, symmetrical, trachea midline, no adenopathy;    Thyroid: no enlargement/tenderness/nodules  Back:     Symmetric, no curvature, ROM normal, no CVA tenderness  Lungs:     Clear to auscultation bilaterally, respirations unlabored  Chest Wall:    No tenderness or deformity   Heart:    Regular rate and rhythm, S1 and S2 normal, no murmur, rub   or gallop  Breast Exam:    Deferred  Abdomen:     Soft, non-tender, bowel sounds active all four quadrants,    no  masses, no organomegaly  Genitalia:    Deferred  Rectal:    Extremities:   Extremities normal, atraumatic, no cyanosis or edema  Pulses:   2+ and symmetric all extremities  Skin:   Skin color, texture, turgor normal, no rashes or lesions  Lymph nodes:   Cervical, supraclavicular,  and axillary nodes normal  Neurologic:   CNII-XII intact, normal strength, sensation and reflexes    throughout          Assessment & Plan:

## 2019-06-29 NOTE — Assessment & Plan Note (Signed)
Chronic problem, well controlled.  Asymptomatic.  Check labs.  No anticipated med changes.  Will follow. 

## 2019-06-29 NOTE — Assessment & Plan Note (Signed)
Check labs and replete prn. 

## 2019-06-30 ENCOUNTER — Other Ambulatory Visit: Payer: Self-pay | Admitting: General Practice

## 2019-06-30 DIAGNOSIS — E785 Hyperlipidemia, unspecified: Secondary | ICD-10-CM

## 2019-06-30 MED ORDER — ATORVASTATIN CALCIUM 10 MG PO TABS: 10.0000 mg | ORAL_TABLET | Freq: Every day | ORAL | 3 refills | Status: DC

## 2019-06-30 MED ORDER — VITAMIN D (ERGOCALCIFEROL) 1.25 MG (50000 UNIT) PO CAPS: 50000.0000 [IU] | ORAL_CAPSULE | ORAL | 0 refills | Status: DC

## 2019-07-05 ENCOUNTER — Telehealth: Payer: Self-pay | Admitting: Family Medicine

## 2019-07-05 NOTE — Telephone Encounter (Signed)
Pt informed. Stated an understanding.

## 2019-07-05 NOTE — Telephone Encounter (Signed)
Pt called in stating that she is having questions about the new meds that was sent in for her.

## 2019-07-05 NOTE — Telephone Encounter (Signed)
Called and spoke with pt. She wanted to advise pcp that she cannot tolerate the high dose vitamin d. States that for the last 3 years when she takes it the medication makes her sick. She did increase her OTC vitamin d to 4,000iu in the morning and 4,000iu in the evening and wanted to make sure that would be ok to take.   Pt also wanted to know what she should do with cholesterol medication if it makes her have muscle aches. I advised that some patients take Co-Q-10 with it but would check with PCP to see if this was suitable for her with other medications. Please advise?

## 2019-07-05 NOTE — Telephone Encounter (Signed)
I agree w/ CoQ10 for aches associated w/ the cholesterol medication  As for the Vitamin D, taking 4-5,000 IU twice daily will be fine

## 2019-07-25 ENCOUNTER — Other Ambulatory Visit: Payer: Self-pay | Admitting: Family Medicine

## 2019-09-02 ENCOUNTER — Other Ambulatory Visit: Payer: Self-pay | Admitting: General Practice

## 2019-09-02 ENCOUNTER — Other Ambulatory Visit: Payer: Self-pay | Admitting: *Deleted

## 2019-09-02 MED ORDER — ANASTROZOLE 1 MG PO TABS: 1.0000 mg | ORAL_TABLET | Freq: Every day | ORAL | 1 refills | Status: DC

## 2019-09-02 MED ORDER — AMLODIPINE BESYLATE 5 MG PO TABS: 5.0000 mg | ORAL_TABLET | Freq: Every day | ORAL | 1 refills | Status: DC

## 2019-09-02 MED ORDER — ATORVASTATIN CALCIUM 10 MG PO TABS: 10.0000 mg | ORAL_TABLET | Freq: Every day | ORAL | 1 refills | Status: DC

## 2019-09-02 MED ORDER — MIRTAZAPINE 15 MG PO TABS: ORAL_TABLET | ORAL | 1 refills | Status: DC

## 2019-09-02 MED ORDER — OLMESARTAN MEDOXOMIL 40 MG PO TABS: 40.0000 mg | ORAL_TABLET | Freq: Every day | ORAL | 1 refills | Status: DC

## 2019-09-03 ENCOUNTER — Other Ambulatory Visit: Payer: Self-pay | Admitting: General Practice

## 2019-09-03 MED ORDER — QUETIAPINE FUMARATE 25 MG PO TABS: ORAL_TABLET | ORAL | 0 refills | Status: DC

## 2019-09-16 ENCOUNTER — Other Ambulatory Visit: Payer: Self-pay | Admitting: Family Medicine

## 2019-09-16 MED ORDER — MIRTAZAPINE 15 MG PO TABS: ORAL_TABLET | ORAL | 1 refills | Status: DC

## 2019-09-16 MED ORDER — ATORVASTATIN CALCIUM 10 MG PO TABS: 10.0000 mg | ORAL_TABLET | Freq: Every day | ORAL | 1 refills | Status: DC

## 2019-09-16 MED ORDER — AMLODIPINE BESYLATE 5 MG PO TABS: 5.0000 mg | ORAL_TABLET | Freq: Every day | ORAL | 1 refills | Status: DC

## 2019-09-16 NOTE — Telephone Encounter (Signed)
[facility] called in asking if we could give a verbal on the mirtazapine, atorvastatin, and amlodipine. Please advise

## 2019-09-16 NOTE — Telephone Encounter (Signed)
These had been filled to local pharmacy on 09/02/19. Ok to send to mail order? Pended all 3 for you.

## 2019-09-21 NOTE — Progress Notes (Signed)
Patient Care Team: Midge Minium, MD as PCP - General (Family Medicine) Luberta Mutter, MD as Consulting Physician (Ophthalmology) Kristeen Miss, MD as Consulting Physician (Neurosurgery) Elsie Saas, MD as Consulting Physician (Orthopedic Surgery) Jari Pigg, MD as Consulting Physician (Dermatology) Jovita Kussmaul, MD as Consulting Physician (General Surgery) Nicholas Lose, MD as Consulting Physician (Hematology and Oncology) Gardenia Phlegm, NP as Nurse Practitioner (Hematology and Oncology)  DIAGNOSIS:    ICD-10-CM   1. 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  Did not take antiestrogen therapy   07/10/2017 Initial Diagnosis   7 mm right breast calcifications, stereotactic biopsy revealed invasive lobular cancer with LCIS ER 95%, PR 2%, Ki-67 5%, HER-2 negative ratio 1.31, T1 BN 0 stage I a clinical stage   08/06/2017 Genetic Testing   Negative genetic testing on the STAT cancer panel.  The STAT Breast cancer panel offered by [facility] includes sequencing and rearrangement analysis for the following 9 genes:  ATM, BRCA1, BRCA2, CDH1, CHEK2, PALB2, PTEN, STK11 and TP53.   The report date is July 28, 2017.  The report was reflexed to the common hereditary cancer panel.  Negative genetic testing on the  common hereditary cancer panel was reported out on August 07, 2107.  The Hereditary Gene Panel offered by [facility] includes sequencing and/or deletion duplication testing of the following 47 genes: APC, ATM, AXIN2, BARD1, BMPR1A, BRCA1, BRCA2, BRIP1, CDH1, CDK4, CDKN2A (p14ARF), CDKN2A (p16INK4a), CHEK2, CTNNA1, DICER1, EPCAM (Deletion/duplication testing only), GREM1 (promoter region deletion/duplication testing only), KIT, MEN1, MLH1, MSH2, MSH3, MSH6, MUTYH, NBN, NF1, NHTL1, PALB2, PDGFRA, PMS2, POLD1, POLE, PTEN, RAD50, RAD51C, RAD51D, SDHB, SDHC, SDHD, SMAD4, SMARCA4. STK11, TP53, TSC1, TSC2, and VHL.  The following genes were evaluated for sequence changes only: SDHA and HOXB13 c.251G>A variant only.     09/04/2017 Surgery   Right mastectomy: Invasive lobular cancer with LCIS, 2.1 cm, grade 1, DCIS, margins negative, 0/3 lymph nodes negative ER 95%, PR 2%, HER-2 negative ratio 1.31, Ki-67 5%, T2N0 stage Ib Left mastectomy: IDC with DCIS 2.3 cm, grade 2, margins negative, 0/3 lymph nodes negative ER 100%, PR 40%, HER-2 negative ratio 1.55, Ki-67 40%, T2N0 stage Ib   08/2017 -  Anti-estrogen oral therapy   Letrozole switched to Aromasin and then switched to anastrozole 03/25/2018 due to hot flashes and depression     CHIEF COMPLIANT: Follow-up of bilateral breast cancers on anastrozole   INTERVAL HISTORY: Jacqueline [name] is a 85 y.o. with above-mentioned history of bilateral breast cancers who underwent bilateral mastectomies and is currently on antiestrogen therapy with anastrozole. She presents to the clinic today for annual follow-up.  She continues to have intermittent hot flashes but they are not bothering her as much.  She denies any lumps or nodules in the chest wall or axilla.  ALLERGIES:  is allergic to aspirin; codeine sulfate;  hydrocodone-acetaminophen; and lactose intolerance (gi).  MEDICATIONS:  Current Outpatient Medications  Medication Sig Dispense Refill  . acetaminophen  (TYLENOL) 500 MG tablet Take 1,000 mg by mouth daily as needed for moderate pain or headache.    Marland Kitchen amLODipine (NORVASC) 5 MG tablet Take 1 tablet (5 mg total) by mouth daily. 90 tablet 1  . anastrozole (ARIMIDEX) 1 MG tablet Take 1 tablet (1 mg total) by mouth daily. 90 tablet 1  . atorvastatin (LIPITOR) 10 MG tablet Take 1 tablet (10 mg total) by mouth daily. 90 tablet 1  . cholecalciferol (VITAMIN D) 1000 units tablet Take 2,000 Units by mouth every evening.    . clobetasol ointment (TEMOVATE) 0.05 %     . diclofenac Sodium (VOLTAREN) 1 % GEL APPLY 2 4 GRAM TO AFFECTED AREA FOUR TIMES A DAY AS NEEDED FOR PAIN    . ibuprofen (ADVIL,MOTRIN) 200 MG tablet Take 400 mg by mouth daily as needed for headache or moderate pain.    Marland Kitchen latanoprost (XALATAN) 0.005 % ophthalmic solution Place 1 drop into both eyes at bedtime.    Marland Kitchen loperamide (IMODIUM) 2 MG capsule Take 3 mg by mouth as needed for diarrhea or loose stools.     . mirtazapine (REMERON) 15 MG tablet TAKE 1 TABLET BY MOUTH EVERYDAY AT BEDTIME 90 tablet 1  . Multiple Vitamins-Minerals (PRESERVISION AREDS 2) CAPS Take 1 capsule by mouth 2 (two) times daily.    Marland Kitchen nystatin ointment (MYCOSTATIN) Apply 1 application topically every evening.     . olmesartan (BENICAR) 40 MG tablet Take 1 tablet (40 mg total) by mouth daily. 90 tablet 1  . omeprazole (PRILOSEC OTC) 20 MG tablet Take 1 tablet (20 mg total) by mouth daily. 90 tablet 4  . QUEtiapine (SEROQUEL) 25 MG tablet TAKE 1 TABLET BY MOUTH EVERYDAY AT BEDTIME 90 tablet 0  . TAZORAC 0.05 % cream APPLY TO AFFECTED AREA IN THE EVENING  1  . Vitamin D, Ergocalciferol, (DRISDOL) 1.25 MG (50000 UT) CAPS capsule Take 1 capsule (50,000 Units total) by mouth every 7 (seven) days. 12 capsule 0   No current facility-administered medications for this visit.    PHYSICAL EXAMINATION: ECOG PERFORMANCE STATUS: 0 - Asymptomatic  Vitals:   09/22/19 1005  BP: 124/63  Pulse: 81  Resp: 16  Temp: 98.2 F (36.8  C)  SpO2: 100%   Filed Weights   09/22/19 1005  Weight: 159 lb 8 oz (72.3 kg)    BREAST: No palpable lumps or nodules in the chest wall or axilla (exam performed in the presence of a chaperone)  LABORATORY DATA:  I have reviewed the data as listed CMP Latest Ref Rng & Units 06/29/2019 11/19/2018 06/25/2018  Glucose 70 - 99 mg/dL 82 97 102(H)  BUN 6 - 23 mg/dL '20 16 17  '$ Creatinine 0.40 - 1.20 mg/dL 0.97 1.09 0.91  Sodium 135 - 145 mEq/L 141 142 141  Potassium 3.5 - 5.1 mEq/L 3.5 3.7 3.8  Chloride 96 - 112 mEq/L 108 107 108  CO2 19 - 32 mEq/L '26 27 25  '$ Calcium 8.4 - 10.5 mg/dL 9.4 8.9 9.6  Total Protein 6.0 - 8.3 g/dL 6.9 - 7.2  Total Bilirubin 0.2 - 1.2 mg/dL 0.4 - 0.4  Alkaline Phos 39 - 117 U/L 113 - 88  AST 0 - 37 U/L 18 - 18  ALT 0 - 35 U/L 19 - 17    Lab Results  Component Value Date   WBC 6.5 06/29/2019  HGB 12.8 06/29/2019   HCT 39.3 06/29/2019   MCV 91.0 06/29/2019   PLT 180.0 06/29/2019   NEUTROABS 4.2 06/29/2019    ASSESSMENT & PLAN:  Malignant neoplasm of upper-outer quadrant of right breast in female, estrogen receptor positive ([place]) Bilateral mastectomies 09/04/2017: Right mastectomy: Invasive lobular cancer with LCIS, 2.1 cm, grade 1, DCIS, margins negative, 0/3 lymph nodes negative ER 95%, PR 2%, HER-2 negative ratio 1.31, Ki-67 5%, T2N0 stage Ib Left mastectomy: IDC with DCIS 2.3 cm, grade 2, margins negative, 0/3 lymph nodes negative ER 100%, PR 40%, HER-2 negative ratio 1.55, Ki-67 40%, T2N0 stage Ib  Current treatment: Antiestrogen therapy with letrozole 2.5 mg daily, switched to exemestane June 2019 and switched to anastrozole September 2019 due to hot flashes and memory/depression problems  Anastrozoletoxicities: Patient will remain at half a tablet of anastrozole daily.  At this dose she is not having any major hot flashes.  Breast cancer surveillance: 1.Breast exam: 09/22/2019: Benign 2.mammogram: No role of mammogram since she had  bilateral mastectomies  She received her COVID-19 vaccine and did very well. Return to clinic in 1 year    No orders of the defined types were placed in this encounter.  The patient has a good understanding of the overall plan. she agrees with it. she will call with any problems that may develop before the next visit here.  Total time spent: 20 mins including face to face time and time spent for planning, charting and coordination of care  Nicholas Lose, MD 09/22/2019  I, Cloyde Reams Dorshimer, am acting as scribe for Dr. Nicholas Lose.  I have reviewed the above documentation for accuracy and completeness, and I agree with the above.

## 2019-09-22 ENCOUNTER — Other Ambulatory Visit: Payer: Self-pay

## 2019-09-22 ENCOUNTER — Inpatient Hospital Stay: Payer: Medicare Other | Attending: Hematology and Oncology | Admitting: Hematology and Oncology

## 2019-09-22 DIAGNOSIS — Z79811 Long term (current) use of aromatase inhibitors: Secondary | ICD-10-CM | POA: Diagnosis not present

## 2019-09-22 DIAGNOSIS — Z79899 Other long term (current) drug therapy: Secondary | ICD-10-CM | POA: Insufficient documentation

## 2019-09-22 DIAGNOSIS — Z9013 Acquired absence of bilateral breasts and nipples: Secondary | ICD-10-CM | POA: Diagnosis not present

## 2019-09-22 DIAGNOSIS — Z791 Long term (current) use of non-steroidal anti-inflammatories (NSAID): Secondary | ICD-10-CM | POA: Insufficient documentation

## 2019-09-22 DIAGNOSIS — F329 Major depressive disorder, single episode, unspecified: Secondary | ICD-10-CM | POA: Diagnosis not present

## 2019-09-22 DIAGNOSIS — C50411 Malignant neoplasm of upper-outer quadrant of right female breast: Secondary | ICD-10-CM | POA: Diagnosis present

## 2019-09-22 DIAGNOSIS — Z170 Estrogen receptor positive status [ER+]: Secondary | ICD-10-CM | POA: Insufficient documentation

## 2019-09-22 NOTE — Assessment & Plan Note (Signed)
Bilateral mastectomies 09/04/2017: Right mastectomy: Invasive lobular cancer with LCIS, 2.1 cm, grade 1, DCIS, margins negative, 0/3 lymph nodes negative ER 95%, PR 2%, HER-2 negative ratio 1.31, Ki-67 5%, T2N0 stage Ib Left mastectomy: IDC with DCIS 2.3 cm, grade 2, margins negative, 0/3 lymph nodes negative ER 100%, PR 40%, HER-2 negative ratio 1.55, Ki-67 40%, T2N0 stage Ib  Current treatment: Antiestrogen therapy with letrozole 2.5 mg daily, switched to exemestane June 2019 and switched to anastrozole September 2019 due to hot flashes and memory/depression problems  Anastrozoletoxicities: Patient will remain at half a tablet of anastrozole daily.  At this dose she is not having any major hot flashes.  Breast cancer surveillance: 1.Breast exam: 09/22/2019: Benign 2.mammogram: No role of mammogram since she had bilateral mastectomies  Return to clinic in 1 year

## 2019-11-15 ENCOUNTER — Other Ambulatory Visit: Payer: Self-pay | Admitting: Family Medicine

## 2019-11-24 ENCOUNTER — Other Ambulatory Visit: Payer: Self-pay | Admitting: Family Medicine

## 2019-12-25 ENCOUNTER — Other Ambulatory Visit: Payer: Self-pay | Admitting: Family Medicine

## 2019-12-28 ENCOUNTER — Other Ambulatory Visit: Payer: Self-pay

## 2019-12-28 ENCOUNTER — Ambulatory Visit: Payer: Medicare Other | Admitting: Family Medicine

## 2019-12-28 ENCOUNTER — Encounter: Payer: Self-pay | Admitting: Family Medicine

## 2019-12-28 VITALS — BP 120/63 | HR 70 | Temp 97.6°F | Resp 16 | Ht 63.0 in | Wt 159.5 lb

## 2019-12-28 DIAGNOSIS — E785 Hyperlipidemia, unspecified: Secondary | ICD-10-CM | POA: Diagnosis not present

## 2019-12-28 DIAGNOSIS — I10 Essential (primary) hypertension: Secondary | ICD-10-CM

## 2019-12-28 LAB — CBC WITH DIFFERENTIAL/PLATELET
Basophils Absolute: 0.1 10*3/uL (ref 0.0–0.1)
Basophils Relative: 1 % (ref 0.0–3.0)
Eosinophils Absolute: 0.1 10*3/uL (ref 0.0–0.7)
Eosinophils Relative: 1.7 % (ref 0.0–5.0)
HCT: 39.9 % (ref 36.0–46.0)
Hemoglobin: 13.3 g/dL (ref 12.0–15.0)
Lymphocytes Relative: 25.4 % (ref 12.0–46.0)
Lymphs Abs: 1.7 10*3/uL (ref 0.7–4.0)
MCHC: 33.3 g/dL (ref 30.0–36.0)
MCV: 91.8 fl (ref 78.0–100.0)
Monocytes Absolute: 0.6 10*3/uL (ref 0.1–1.0)
Monocytes Relative: 8.5 % (ref 3.0–12.0)
Neutro Abs: 4.2 10*3/uL (ref 1.4–7.7)
Neutrophils Relative %: 63.4 % (ref 43.0–77.0)
Platelets: 167 10*3/uL (ref 150.0–400.0)
RBC: 4.35 Mil/uL (ref 3.87–5.11)
RDW: 12.8 % (ref 11.5–15.5)
WBC: 6.6 10*3/uL (ref 4.0–10.5)

## 2019-12-28 LAB — HEPATIC FUNCTION PANEL
ALT: 27 U/L (ref 0–35)
AST: 20 U/L (ref 0–37)
Albumin: 4.2 g/dL (ref 3.5–5.2)
Alkaline Phosphatase: 119 U/L — ABNORMAL HIGH (ref 39–117)
Bilirubin, Direct: 0.1 mg/dL (ref 0.0–0.3)
Total Bilirubin: 0.5 mg/dL (ref 0.2–1.2)
Total Protein: 7.1 g/dL (ref 6.0–8.3)

## 2019-12-28 LAB — LIPID PANEL
Cholesterol: 156 mg/dL (ref 0–200)
HDL: 35.8 mg/dL — ABNORMAL LOW (ref >39.00)
NonHDL: 120.13
Total CHOL/HDL Ratio: 4
Triglycerides: 222 mg/dL — ABNORMAL HIGH (ref 0.0–149.0)
VLDL: 44.4 mg/dL — ABNORMAL HIGH (ref 0.0–40.0)

## 2019-12-28 LAB — BASIC METABOLIC PANEL
BUN: 22 mg/dL (ref 6–23)
CO2: 29 mEq/L (ref 19–32)
Calcium: 9.8 mg/dL (ref 8.4–10.5)
Chloride: 106 mEq/L (ref 96–112)
Creatinine, Ser: 1.08 mg/dL (ref 0.40–1.20)
GFR: 48.45 mL/min — ABNORMAL LOW (ref >60.00)
Glucose, Bld: 85 mg/dL (ref 70–99)
Potassium: 4.3 mEq/L (ref 3.5–5.1)
Sodium: 140 mEq/L (ref 135–145)

## 2019-12-28 LAB — TSH: TSH: 1.86 u[IU]/mL (ref 0.35–4.50)

## 2019-12-28 LAB — LDL CHOLESTEROL, DIRECT: Direct LDL: 86 mg/dL

## 2019-12-28 NOTE — Assessment & Plan Note (Signed)
Chronic problem.  Tolerating statin w/o difficulty.  Check labs.  Adjust meds prn  

## 2019-12-28 NOTE — Assessment & Plan Note (Signed)
Chronic problem.  Well controlled.  Asymptomatic.  Check labs.  No anticipated med changes.  Will follow. 

## 2019-12-28 NOTE — Progress Notes (Signed)
   Subjective:    Patient ID: Jacqueline [name], female    DOB: [birth year], 85 y.o.   MRN: [id number]  HPI HTN- chronic problem, on Amlodipine 5mg  daily, Olmesartan 40mg  daily w/ good control.  No CP, SOB, HAs, visual changes, edema.  Hyperlipidemia- chronic problem, on Atorvastatin 10mg  daily.  Denies abd pain, N/V.  Works in yard regularly, very active in her hobbies but no regular exercise.   Review of Systems For ROS see HPI   This visit occurred during the SARS-CoV-2 public health emergency.  Safety protocols were in place, including screening questions prior to the visit, additional usage of staff PPE, and extensive cleaning of exam room while observing appropriate contact time as indicated for disinfecting solutions.       Objective:   Physical Exam Vitals reviewed.  Constitutional:      General: She is not in acute distress.    Appearance: Normal appearance. She is well-developed.  HENT:     Head: Normocephalic and atraumatic.  Eyes:     Conjunctiva/sclera: Conjunctivae normal.     Pupils: Pupils are equal, round, and reactive to light.  Neck:     Thyroid: No thyromegaly.  Cardiovascular:     Rate and Rhythm: Normal rate and regular rhythm.     Heart sounds: Normal heart sounds. No murmur heard.   Pulmonary:     Effort: Pulmonary effort is normal. No respiratory distress.     Breath sounds: Normal breath sounds.  Abdominal:     General: There is no distension.     Palpations: Abdomen is soft.     Tenderness: There is no abdominal tenderness.  Musculoskeletal:     Cervical back: Normal range of motion and neck supple.  Lymphadenopathy:     Cervical: No cervical adenopathy.  Skin:    General: Skin is warm and dry.  Neurological:     Mental Status: She is alert and oriented to person, place, and time. Mental status is at baseline.  Psychiatric:        Mood and Affect: Mood normal.        Behavior: Behavior normal.        Thought Content: Thought content  normal.           Assessment & Plan:

## 2019-12-28 NOTE — Patient Instructions (Addendum)
Schedule your complete physical in 6 months We'll notify you of your lab results and make any changes if needed Continue to work on healthy diet and regular exercise Call with any questions or concerns Have a great summer!! Happy [place] Birthday!!

## 2019-12-30 ENCOUNTER — Encounter: Payer: Self-pay | Admitting: General Practice

## 2020-01-08 ENCOUNTER — Other Ambulatory Visit: Payer: Self-pay | Admitting: Family Medicine

## 2020-01-19 ENCOUNTER — Other Ambulatory Visit: Payer: Self-pay | Admitting: Family Medicine

## 2020-02-03 ENCOUNTER — Telehealth: Payer: Self-pay | Admitting: Family Medicine

## 2020-02-03 NOTE — Telephone Encounter (Signed)
The recommendation is that they be tested 3-5 days after exposure and if negative but they subsequently develop sxs, they need to be tested again.

## 2020-02-03 NOTE — Telephone Encounter (Signed)
Pt called in stating that their granddaughter tested positive from covid yesterday. She and her husband were around her on Tuesday they didn't have masks on and they are vaccinated. She wanted to know to if they should be tested and how long after exposure should they be tested?   They are not having any symptoms  Please call [phone]

## 2020-02-03 NOTE — Telephone Encounter (Signed)
Or call the cell #

## 2020-02-03 NOTE — Telephone Encounter (Signed)
Patient notified of PCP recommendations and is agreement and expresses an understanding.  

## 2020-02-03 NOTE — Telephone Encounter (Signed)
Please advise 

## 2020-03-01 ENCOUNTER — Other Ambulatory Visit: Payer: Self-pay | Admitting: Hematology and Oncology

## 2020-06-29 ENCOUNTER — Telehealth: Payer: Self-pay | Admitting: Family Medicine

## 2020-06-29 ENCOUNTER — Ambulatory Visit (INDEPENDENT_AMBULATORY_CARE_PROVIDER_SITE_OTHER): Payer: Medicare Other | Admitting: Family Medicine

## 2020-06-29 ENCOUNTER — Other Ambulatory Visit: Payer: Self-pay

## 2020-06-29 ENCOUNTER — Encounter: Payer: Self-pay | Admitting: Family Medicine

## 2020-06-29 VITALS — BP 118/70 | HR 74 | Temp 97.4°F | Resp 16 | Ht 62.0 in | Wt 160.8 lb

## 2020-06-29 DIAGNOSIS — Z0000 Encounter for general adult medical examination without abnormal findings: Secondary | ICD-10-CM

## 2020-06-29 DIAGNOSIS — E559 Vitamin D deficiency, unspecified: Secondary | ICD-10-CM | POA: Diagnosis not present

## 2020-06-29 DIAGNOSIS — I10 Essential (primary) hypertension: Secondary | ICD-10-CM | POA: Diagnosis not present

## 2020-06-29 LAB — CBC WITH DIFFERENTIAL/PLATELET
Basophils Absolute: 0.1 10*3/uL (ref 0.0–0.1)
Basophils Relative: 0.9 % (ref 0.0–3.0)
Eosinophils Absolute: 0.1 10*3/uL (ref 0.0–0.7)
Eosinophils Relative: 1.5 % (ref 0.0–5.0)
HCT: 39.2 % (ref 36.0–46.0)
Hemoglobin: 13.2 g/dL (ref 12.0–15.0)
Lymphocytes Relative: 26.5 % (ref 12.0–46.0)
Lymphs Abs: 1.7 10*3/uL (ref 0.7–4.0)
MCHC: 33.8 g/dL (ref 30.0–36.0)
MCV: 91.9 fl (ref 78.0–100.0)
Monocytes Absolute: 0.6 10*3/uL (ref 0.1–1.0)
Monocytes Relative: 9.4 % (ref 3.0–12.0)
Neutro Abs: 4 10*3/uL (ref 1.4–7.7)
Neutrophils Relative %: 61.7 % (ref 43.0–77.0)
Platelets: 174 10*3/uL (ref 150.0–400.0)
RBC: 4.26 Mil/uL (ref 3.87–5.11)
RDW: 12.8 % (ref 11.5–15.5)
WBC: 6.4 10*3/uL (ref 4.0–10.5)

## 2020-06-29 LAB — LIPID PANEL
Cholesterol: 161 mg/dL (ref 0–200)
HDL: 39.9 mg/dL (ref >39.00)
LDL Cholesterol: 92 mg/dL (ref 0–99)
NonHDL: 120.6
Total CHOL/HDL Ratio: 4
Triglycerides: 143 mg/dL (ref 0.0–149.0)
VLDL: 28.6 mg/dL (ref 0.0–40.0)

## 2020-06-29 LAB — BASIC METABOLIC PANEL
BUN: 25 mg/dL — ABNORMAL HIGH (ref 6–23)
CO2: 28 mEq/L (ref 19–32)
Calcium: 9.3 mg/dL (ref 8.4–10.5)
Chloride: 109 mEq/L (ref 96–112)
Creatinine, Ser: 1.11 mg/dL (ref 0.40–1.20)
GFR: 45.96 mL/min — ABNORMAL LOW (ref >60.00)
Glucose, Bld: 98 mg/dL (ref 70–99)
Potassium: 3.8 mEq/L (ref 3.5–5.1)
Sodium: 142 mEq/L (ref 135–145)

## 2020-06-29 LAB — HEPATIC FUNCTION PANEL
ALT: 17 U/L (ref 0–35)
AST: 16 U/L (ref 0–37)
Albumin: 4.2 g/dL (ref 3.5–5.2)
Alkaline Phosphatase: 92 U/L (ref 39–117)
Bilirubin, Direct: 0.1 mg/dL (ref 0.0–0.3)
Total Bilirubin: 0.7 mg/dL (ref 0.2–1.2)
Total Protein: 7 g/dL (ref 6.0–8.3)

## 2020-06-29 LAB — TSH: TSH: 2.11 u[IU]/mL (ref 0.35–4.50)

## 2020-06-29 LAB — VITAMIN D 25 HYDROXY (VIT D DEFICIENCY, FRACTURES): VITD: 55.17 ng/mL (ref 30.00–100.00)

## 2020-06-29 NOTE — Assessment & Plan Note (Signed)
Chronic problem.  Excellent control.  Asymptomatic.  Check labs.  No anticipated med changes. 

## 2020-06-29 NOTE — Telephone Encounter (Signed)
Ms. Resch called and said that her son who she is around all the time tested positive for covid today.  She just wanted to let you know since she was seen the the office today.

## 2020-06-29 NOTE — Telephone Encounter (Signed)
She should be tested immediately if she develops sxs.  Otherwise she needs to mask if she goes any where or is around anyone.  If she develops sxs, then she must quarantine.

## 2020-06-29 NOTE — Assessment & Plan Note (Signed)
Pt's PE WNL.  UTD on colonoscopy, immunizations.  No need for mammogram after bilateral mastectomy.  Check labs.  Anticipatory guidance provided.

## 2020-06-29 NOTE — Assessment & Plan Note (Signed)
Check labs and replete prn. 

## 2020-06-29 NOTE — Telephone Encounter (Signed)
Patient aware of PCP recommendations  

## 2020-06-29 NOTE — Telephone Encounter (Signed)
Patient called in stating that she just looked up quarantine protocol on CDC website and it states that since she is fully vaccinated that she does not needs to quarantine and to only get tested if she is having symptoms. Wanting to know exactly what PCP recommends. Please advise

## 2020-06-29 NOTE — Progress Notes (Signed)
   Subjective:    Patient ID: Jacqueline [name], female    DOB: 1935/04/22, 85 y.o.   MRN: [id number]  HPI CPE- UTD on colonoscopy.  No longer having mammograms due to double mastectomy.  UTD on flu, Tdap, PNA, COVID (including booster).  Reviewed past medical, surgical, family and social histories.   Patient Care Team    Relationship Specialty Notifications Start End  Sheliah Hatch, MD PCP - General Family Medicine  11/28/15   Maris Berger, MD Consulting Physician Ophthalmology  05/17/16   Barnett Abu, MD Consulting Physician Neurosurgery  02/26/17   Salvatore Marvel, MD Consulting Physician Orthopedic Surgery  05/28/17   Elmon Else, MD Consulting Physician Dermatology  05/28/17   Griselda Miner, MD Consulting Physician General Surgery  12/10/17   Serena Croissant, MD Consulting Physician Hematology and Oncology  12/10/17   Loa Socks, NP Nurse Practitioner Hematology and Oncology  12/10/17     Health Maintenance  Topic Date Due  . COVID-19 Vaccine (4 - Booster for Pfizer series) 10/04/2020  . TETANUS/TDAP  06/08/2022  . INFLUENZA VACCINE  Completed  . DEXA SCAN  Completed  . PNA vac Low Risk Adult  Completed      Review of Systems Patient reports no vision/ hearing changes, adenopathy,fever, weight change,  persistant/recurrent hoarseness , swallowing issues, chest pain, palpitations, edema, persistant/recurrent cough, hemoptysis, dyspnea (rest/exertional/paroxysmal nocturnal), gastrointestinal bleeding (melena, rectal bleeding), abdominal pain, significant heartburn, bowel changes, GU symptoms (dysuria, hematuria, incontinence), Gyn symptoms (abnormal  bleeding, pain),  syncope, focal weakness, memory loss, numbness & tingling, skin/hair/nail changes, abnormal bruising or bleeding, anxiety, or depression.   This visit occurred during the SARS-CoV-2 public health emergency.  Safety protocols were in place, including screening questions prior to the visit,  additional usage of staff PPE, and extensive cleaning of exam room while observing appropriate contact time as indicated for disinfecting solutions.       Objective:   Physical Exam General Appearance:    Alert, cooperative, no distress, appears stated age  Head:    Normocephalic, without obvious abnormality, atraumatic  Eyes:    PERRL, conjunctiva/corneas clear, EOM's intact, fundi    benign, both eyes  Ears:    Normal TM's and external ear canals, both ears  Nose:   Deferred due to COVID  Throat:   Neck:   Supple, symmetrical, trachea midline, no adenopathy;    Thyroid: no enlargement/tenderness/nodules  Back:     Symmetric, no curvature, ROM normal, no CVA tenderness  Lungs:     Clear to auscultation bilaterally, respirations unlabored  Chest Wall:    No tenderness or deformity   Heart:    Regular rate and rhythm, S1 and S2 normal, no murmur, rub   or gallop  Breast Exam:    Deferred  Abdomen:     Soft, non-tender, bowel sounds active all four quadrants,    no masses, no organomegaly  Genitalia:    Deferred  Rectal:    Extremities:   Extremities normal, atraumatic, no cyanosis or edema  Pulses:   2+ and symmetric all extremities  Skin:   Skin color, texture, turgor normal, no rashes or lesions  Lymph nodes:   Cervical, supraclavicular, and axillary nodes normal  Neurologic:   CNII-XII intact, normal strength, sensation and reflexes    throughout          Assessment & Plan:

## 2020-06-29 NOTE — Patient Instructions (Signed)
Follow up 6 months to recheck BP and cholesterol We'll notify you of your lab results and make any changes if needed Continue to work on healthy diet and regular exercise- you look great! Call with any questions or concerns Stay Safe!  Stay Healthy! Happy New Year!!

## 2020-09-13 DIAGNOSIS — M542 Cervicalgia: Secondary | ICD-10-CM | POA: Insufficient documentation

## 2020-09-13 DIAGNOSIS — M549 Dorsalgia, unspecified: Secondary | ICD-10-CM | POA: Insufficient documentation

## 2020-09-13 DIAGNOSIS — G8929 Other chronic pain: Secondary | ICD-10-CM | POA: Insufficient documentation

## 2020-09-20 ENCOUNTER — Encounter: Payer: Self-pay | Admitting: Family Medicine

## 2020-09-20 ENCOUNTER — Other Ambulatory Visit: Payer: Self-pay

## 2020-09-20 ENCOUNTER — Ambulatory Visit: Payer: Medicare Other | Admitting: Family Medicine

## 2020-09-20 VITALS — BP 118/66 | HR 77 | Temp 97.3°F | Ht 62.0 in | Wt 163.2 lb

## 2020-09-20 DIAGNOSIS — R42 Dizziness and giddiness: Secondary | ICD-10-CM

## 2020-09-20 DIAGNOSIS — R519 Headache, unspecified: Secondary | ICD-10-CM | POA: Diagnosis not present

## 2020-09-20 DIAGNOSIS — R5383 Other fatigue: Secondary | ICD-10-CM | POA: Diagnosis not present

## 2020-09-20 LAB — CBC WITH DIFFERENTIAL/PLATELET
Basophils Absolute: 0 10*3/uL (ref 0.0–0.1)
Basophils Relative: 0.5 % (ref 0.0–3.0)
Eosinophils Absolute: 0.1 10*3/uL (ref 0.0–0.7)
Eosinophils Relative: 1.4 % (ref 0.0–5.0)
HCT: 39.6 % (ref 36.0–46.0)
Hemoglobin: 13.5 g/dL (ref 12.0–15.0)
Lymphocytes Relative: 29.9 % (ref 12.0–46.0)
Lymphs Abs: 1.9 10*3/uL (ref 0.7–4.0)
MCHC: 34.1 g/dL (ref 30.0–36.0)
MCV: 90.4 fl (ref 78.0–100.0)
Monocytes Absolute: 0.6 10*3/uL (ref 0.1–1.0)
Monocytes Relative: 8.8 % (ref 3.0–12.0)
Neutro Abs: 3.8 10*3/uL (ref 1.4–7.7)
Neutrophils Relative %: 59.4 % (ref 43.0–77.0)
Platelets: 168 10*3/uL (ref 150.0–400.0)
RBC: 4.38 Mil/uL (ref 3.87–5.11)
RDW: 12.7 % (ref 11.5–15.5)
WBC: 6.4 10*3/uL (ref 4.0–10.5)

## 2020-09-20 LAB — BASIC METABOLIC PANEL
BUN: 19 mg/dL (ref 6–23)
CO2: 23 mEq/L (ref 19–32)
Calcium: 9.5 mg/dL (ref 8.4–10.5)
Chloride: 109 mEq/L (ref 96–112)
Creatinine, Ser: 1.12 mg/dL (ref 0.40–1.20)
GFR: 45.4 mL/min — ABNORMAL LOW (ref >60.00)
Glucose, Bld: 94 mg/dL (ref 70–99)
Potassium: 3.5 mEq/L (ref 3.5–5.1)
Sodium: 141 mEq/L (ref 135–145)

## 2020-09-20 LAB — B12 AND FOLATE PANEL
Folate: 12.4 ng/mL (ref >5.9)
Vitamin B-12: 327 pg/mL (ref 211–911)

## 2020-09-20 LAB — TSH: TSH: 1.94 u[IU]/mL (ref 0.35–4.50)

## 2020-09-20 NOTE — Patient Instructions (Signed)
Follow up in 1 month to recheck BP We'll notify you of your lab results and make any changes if needed HOLD the Amlodipine 5mg  Continue to monitor your blood pressure at home Make sure you are drinking plenty of fluids Call with any questions or concerns Hang in there!!!

## 2020-09-20 NOTE — Progress Notes (Signed)
   Subjective:    Patient ID: Jacqueline [name], female    DOB: 03/15/1935, 86 y.o.   MRN: [id number]  HPI Fatigue- pt reports that several weeks ago she started feeling 'pretty lethargic'.  Pt started checking her BP at home and she noted several low readings.  Pt notes that she develops head tightness and light headedness when using her electronics but she has started to have these sxs more frequently.  Pt has upcoming appt w/ PT to help w/ back/neck pain.  Pt will wake in the morning and feel like she wants to 'go back to bed again'.  Pt reports sleeping well at night.  No recent medication changes.  Pt has been on Remeron and Seroquel for 'nearly 25 yrs' to prevent migraines.  No CP, SOB.   Review of Systems For ROS see HPI   This visit occurred during the SARS-CoV-2 public health emergency.  Safety protocols were in place, including screening questions prior to the visit, additional usage of staff PPE, and extensive cleaning of exam room while observing appropriate contact time as indicated for disinfecting solutions.       Objective:   Physical Exam Constitutional:      General: She is not in acute distress.    Appearance: Normal appearance. She is well-developed. She is not ill-appearing.  HENT:     Head: Normocephalic and atraumatic.  Eyes:     Conjunctiva/sclera: Conjunctivae normal.     Pupils: Pupils are equal, round, and reactive to light.  Neck:     Thyroid: No thyromegaly.  Cardiovascular:     Rate and Rhythm: Normal rate and regular rhythm.     Pulses: Normal pulses.     Heart sounds: Normal heart sounds. No murmur heard.   Pulmonary:     Effort: Pulmonary effort is normal. No respiratory distress.     Breath sounds: Normal breath sounds.  Abdominal:     General: There is no distension.     Palpations: Abdomen is soft.     Tenderness: There is no abdominal tenderness.  Musculoskeletal:     Cervical back: Normal range of motion and neck supple.     Right lower  leg: No edema.     Left lower leg: No edema.  Lymphadenopathy:     Cervical: No cervical adenopathy.  Skin:    General: Skin is warm and dry.  Neurological:     Mental Status: She is alert and oriented to person, place, and time.  Psychiatric:        Behavior: Behavior normal.           Assessment & Plan:  Fatigue/light headed/HA- new.  I suspect this is multifactorial, but the largest issue being her periodically low BP.  Based on this, we will hold her Amlodipine and continue to monitor her pressure.  We will check electrolytes as pt reports she had fatigue like this before and her potassium was low.  Check thyroid and CBC.  I suspect her dizziness occurs when using her electronics or when changing positions too quickly.  We had previously discussed blue light blocking glasses and attention to posture when on her devices- being mindful not to be hunched over or looking down.  She has PT appt upcoming which I think will be beneficial for her neck pain and headaches.  Will determine next steps based on BP and lab results.  Pt expressed understanding and is in agreement w/ plan.

## 2020-09-20 NOTE — Assessment & Plan Note (Signed)
Bilateral mastectomies 09/04/2017: Right mastectomy: Invasive lobular cancer with LCIS, 2.1 cm, grade 1, DCIS, margins negative, 0/3 lymph nodes negative ER 95%, PR 2%, HER-2 negative ratio 1.31, Ki-67 5%, T2N0 stage Ib Left mastectomy: IDC with DCIS 2.3 cm, grade 2, margins negative, 0/3 lymph nodes negative ER 100%, PR 40%, HER-2 negative ratio 1.55, Ki-67 40%, T2N0 stage Ib  Current treatment: Antiestrogen therapy with letrozole 2.5 mg daily, switched to exemestane June 2019 and switched to anastrozole September 2019 due to hot flashes and memory/depression problems  Anastrozoletoxicities:Patient will remain at half a tablet of anastrozole daily. At this dose she is not having any major hot flashes.  Breast cancer surveillance: 1.Breast exam: 09/21/2020: Benign 2.mammogram: No role of mammogram since she had bilateral mastectomies  She received her COVID-19 vaccine and did very well. Return to clinic in1 year

## 2020-09-20 NOTE — Progress Notes (Signed)
Patient Care Team: Midge Minium, MD as PCP - General (Family Medicine) Luberta Mutter, MD as Consulting Physician (Ophthalmology) Kristeen Miss, MD as Consulting Physician (Neurosurgery) Elsie Saas, MD as Consulting Physician (Orthopedic Surgery) Jari Pigg, MD as Consulting Physician (Dermatology) Jovita Kussmaul, MD as Consulting Physician (General Surgery) Nicholas Lose, MD as Consulting Physician (Hematology and Oncology) Gardenia Phlegm, NP as Nurse Practitioner (Hematology and Oncology)  DIAGNOSIS:    ICD-10-CM   1. 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  Did not take antiestrogen therapy   07/10/2017 Initial Diagnosis   7 mm right breast calcifications, stereotactic biopsy revealed invasive lobular cancer with LCIS ER 95%, PR 2%, Ki-67 5%, HER-2 negative ratio 1.31, T1 BN 0 stage I a clinical stage   08/06/2017 Genetic Testing   Negative genetic testing on the STAT cancer panel.  The STAT Breast cancer panel offered by [facility] includes sequencing and rearrangement analysis for the following 9 genes:  ATM, BRCA1, BRCA2, CDH1, CHEK2, PALB2, PTEN, STK11 and TP53.   The report date is July 28, 2017.  The report was reflexed to the common hereditary cancer panel.  Negative genetic testing on the  common hereditary cancer panel was reported out on August 07, 2107.  The Hereditary Gene Panel offered by [facility] includes sequencing and/or deletion duplication testing of the following 47 genes: APC, ATM, AXIN2, BARD1, BMPR1A, BRCA1, BRCA2, BRIP1, CDH1, CDK4, CDKN2A (p14ARF), CDKN2A (p16INK4a), CHEK2, CTNNA1, DICER1, EPCAM (Deletion/duplication testing only), GREM1 (promoter region deletion/duplication testing only), KIT, MEN1, MLH1, MSH2, MSH3, MSH6, MUTYH, NBN, NF1, NHTL1, PALB2, PDGFRA, PMS2, POLD1, POLE, PTEN, RAD50, RAD51C, RAD51D, SDHB, SDHC, SDHD, SMAD4, SMARCA4. STK11, TP53, TSC1, TSC2, and VHL.  The following genes were evaluated for sequence changes only: SDHA and HOXB13 c.251G>A variant only.     09/04/2017 Surgery   Right mastectomy: Invasive lobular cancer with LCIS, 2.1 cm, grade 1, DCIS, margins negative, 0/3 lymph nodes negative ER 95%, PR 2%, HER-2 negative ratio 1.31, Ki-67 5%, T2N0 stage Ib Left mastectomy: IDC with DCIS 2.3 cm, grade 2, margins negative, 0/3 lymph nodes negative ER 100%, PR 40%, HER-2 negative ratio 1.55, Ki-67 40%, T2N0 stage Ib   08/2017 -  Anti-estrogen oral therapy   Letrozole switched to Aromasin and then switched to anastrozole 03/25/2018 due to hot flashes and depression     CHIEF COMPLIANT: Follow-up of bilateral breast cancers on anastrozole   INTERVAL HISTORY: Jacqueline [name] is a 86 y.o. with above-mentioned history of bilateral breast cancers who underwent bilateral mastectomies and is currently on antiestrogen therapy with anastrozole. She presents to the clinic today for annual follow-up.  She continues to have mild hot flashes but they are bearable at half a tablet daily of anastrozole.  She wants to know if she can discontinue her antiestrogen pill since she has taken it for 3  years.  ALLERGIES:  is allergic to aspirin, codeine sulfate, hydrocodone-acetaminophen, and lactose intolerance (gi).  MEDICATIONS:  Current Outpatient Medications   Medication Sig Dispense Refill  . acetaminophen (TYLENOL) 500 MG tablet Take 1,000 mg by mouth daily as needed for moderate pain or headache.    Marland Kitchen amLODipine (NORVASC) 5 MG tablet TAKE 1 TABLET BY MOUTH  DAILY 90 tablet 3  . anastrozole (ARIMIDEX) 1 MG tablet TAKE 1 TABLET BY MOUTH  DAILY 90 tablet 3  . atorvastatin (LIPITOR) 10 MG tablet TAKE 1 TABLET BY MOUTH  DAILY 90 tablet 3  . Brimonidine Tartrate (ALPHAGAN P OP) Apply to eye.    . cholecalciferol (VITAMIN D) 1000 units tablet Take 2,000 Units by mouth every evening.    . clobetasol ointment (TEMOVATE) 0.05 %     . diclofenac Sodium (VOLTAREN) 1 % GEL     . Dorzolamide HCl-Timolol Mal PF 2-0.5 % SOLN Apply 1 drop to eye 2 (two) times daily.    Marland Kitchen ibuprofen (ADVIL,MOTRIN) 200 MG tablet Take 400 mg by mouth daily as needed for headache or moderate pain.    Marland Kitchen latanoprost (XALATAN) 0.005 % ophthalmic solution SMARTSIG:In Eye(s)    . loperamide (IMODIUM) 2 MG capsule Take 3 mg by mouth as needed for diarrhea or loose stools.     . mirtazapine (REMERON) 15 MG tablet TAKE 1 TABLET BY MOUTH  DAILY AT BEDTIME 90 tablet 3  . Multiple Vitamins-Minerals (PRESERVISION AREDS 2) CAPS Take 1 capsule by mouth 2 (two) times daily.    . Netarsudil-Latanoprost (ROCKLATAN) 0.02-0.005 % SOLN Apply to eye.    . nystatin ointment (MYCOSTATIN) Apply 1 application topically every evening.     . olmesartan (BENICAR) 40 MG tablet TAKE 1 TABLET BY MOUTH  DAILY 90 tablet 3  . omeprazole (PRILOSEC OTC) 20 MG tablet Take 1 tablet (20 mg total) by mouth daily. 90 tablet 4  . QUEtiapine (SEROQUEL) 25 MG tablet TAKE 1 TABLET BY MOUTH  DAILY AT BEDTIME 90 tablet 3  . TAZORAC 0.05 % cream APPLY TO AFFECTED AREA IN THE EVENING  1   No current facility-administered medications for this visit.    PHYSICAL EXAMINATION: ECOG PERFORMANCE STATUS: 1 - Symptomatic but completely ambulatory  Vitals:   09/21/20 1020  BP: (!) 153/65  Pulse: 74  Resp: 18  Temp: 97.9 F (36.6  C)  SpO2: 100%   Filed Weights   09/21/20 1020  Weight: 164 lb 3.2 oz (74.5 kg)    BREAST: Bilateral mastectomies without any palpable lumps or nodules. (exam performed in the presence of a chaperone)  LABORATORY DATA:  I have reviewed the data as listed CMP Latest Ref Rng & Units 09/20/2020 06/29/2020 12/28/2019  Glucose 70 - 99 mg/dL 94 98 85  BUN 6 - 23 mg/dL 19 25(H) 22  Creatinine 0.40 - 1.20 mg/dL 1.12 1.11 1.08  Sodium 135 - 145 mEq/L 141 142 140  Potassium 3.5 - 5.1 mEq/L 3.5 3.8 4.3  Chloride 96 - 112 mEq/L 109 109 106  CO2 19 - 32 mEq/L _0 Calcium 8.4 - 10.5 mg/dL 9.5 9.3 9.8  Total Protein 6.0 - 8.3 g/dL - 7.0 7.1  Total Bilirubin 0.2 - 1.2 mg/dL - 0.7 0.5  Alkaline Phos 39 - 117 U/L - 92 119(H)  AST 0 - 37 U/L - 16 20  ALT 0 - 35 U/L - 17 27    Lab Results  Component Value Date   WBC 6.4 09/20/2020  HGB 13.5 09/20/2020   HCT 39.6 09/20/2020   MCV 90.4 09/20/2020   PLT 168.0 09/20/2020   NEUTROABS 3.8 09/20/2020    ASSESSMENT & PLAN:  Malignant neoplasm of upper-outer quadrant of right breast in female, estrogen receptor positive ([place]) Bilateral mastectomies 09/04/2017: Right mastectomy: Invasive lobular cancer with LCIS, 2.1 cm, grade 1, DCIS, margins negative, 0/3 lymph nodes negative ER 95%, PR 2%, HER-2 negative ratio 1.31, Ki-67 5%, T2N0 stage Ib Left mastectomy: IDC with DCIS 2.3 cm, grade 2, margins negative, 0/3 lymph nodes negative ER 100%, PR 40%, HER-2 negative ratio 1.55, Ki-67 40%, T2N0 stage Ib  Current treatment: Antiestrogen therapy with letrozole 2.5 mg daily, switched to exemestane June 2019 and switched to anastrozole September 2019 due to hot flashes and memory/depression problems  Anastrozoletoxicities:Mild hot flashes. We discussed the pros and cons of continuing antiestrogen therapy and made a decision to discontinue her treatment at this time.  Breast cancer surveillance: 1.Breast exam: 09/21/2020: Benign 2.mammogram:  No role of mammogram since she had bilateral mastectomies   Return to clinic in1 year    No orders of the defined types were placed in this encounter.  The patient has a good understanding of the overall plan. she agrees with it. she will call with any problems that may develop before the next visit here.  Total time spent: 20 mins including face to face time and time spent for planning, charting and coordination of care  Rulon Eisenmenger, MD, MPH 09/21/2020  I, Cloyde Reams Dorshimer, am acting as scribe for Dr. Nicholas Lose.  I have reviewed the above documentation for accuracy and completeness, and I agree with the above.

## 2020-09-21 ENCOUNTER — Telehealth: Payer: Self-pay | Admitting: Hematology and Oncology

## 2020-09-21 ENCOUNTER — Inpatient Hospital Stay: Payer: Medicare Other | Attending: Hematology and Oncology | Admitting: Hematology and Oncology

## 2020-09-21 DIAGNOSIS — Z79899 Other long term (current) drug therapy: Secondary | ICD-10-CM | POA: Insufficient documentation

## 2020-09-21 DIAGNOSIS — Z170 Estrogen receptor positive status [ER+]: Secondary | ICD-10-CM | POA: Diagnosis not present

## 2020-09-21 DIAGNOSIS — Z79811 Long term (current) use of aromatase inhibitors: Secondary | ICD-10-CM | POA: Insufficient documentation

## 2020-09-21 DIAGNOSIS — C50411 Malignant neoplasm of upper-outer quadrant of right female breast: Secondary | ICD-10-CM | POA: Diagnosis present

## 2020-09-21 DIAGNOSIS — Z9013 Acquired absence of bilateral breasts and nipples: Secondary | ICD-10-CM | POA: Diagnosis not present

## 2020-09-21 MED ORDER — ANASTROZOLE 1 MG PO TABS: 0.5000 mg | ORAL_TABLET | Freq: Every day | ORAL | 3 refills | Status: DC

## 2020-09-21 NOTE — Telephone Encounter (Signed)
Scheduled appt per 3/24 los. Pt aware.  

## 2020-09-22 MED ORDER — POTASSIUM CHLORIDE ER 10 MEQ PO TBCR: 10.0000 meq | EXTENDED_RELEASE_TABLET | Freq: Every day | ORAL | 0 refills | Status: DC

## 2020-10-26 ENCOUNTER — Other Ambulatory Visit: Payer: Self-pay

## 2020-10-26 ENCOUNTER — Ambulatory Visit: Payer: Medicare Other | Admitting: Family Medicine

## 2020-10-26 ENCOUNTER — Encounter: Payer: Self-pay | Admitting: Family Medicine

## 2020-10-26 ENCOUNTER — Other Ambulatory Visit: Payer: Self-pay | Admitting: Family Medicine

## 2020-10-26 VITALS — BP 120/80 | HR 67 | Temp 97.2°F | Resp 17 | Ht 62.0 in | Wt 161.6 lb

## 2020-10-26 DIAGNOSIS — I10 Essential (primary) hypertension: Secondary | ICD-10-CM | POA: Diagnosis not present

## 2020-10-26 DIAGNOSIS — E876 Hypokalemia: Secondary | ICD-10-CM

## 2020-10-26 LAB — BASIC METABOLIC PANEL
BUN: 22 mg/dL (ref 6–23)
CO2: 25 mEq/L (ref 19–32)
Calcium: 9.1 mg/dL (ref 8.4–10.5)
Chloride: 109 mEq/L (ref 96–112)
Creatinine, Ser: 1.23 mg/dL — ABNORMAL HIGH (ref 0.40–1.20)
GFR: 40.54 mL/min — ABNORMAL LOW (ref >60.00)
Glucose, Bld: 82 mg/dL (ref 70–99)
Potassium: 3.8 mEq/L (ref 3.5–5.1)
Sodium: 141 mEq/L (ref 135–145)

## 2020-10-26 NOTE — Patient Instructions (Signed)
Follow up as needed or as scheduled We'll notify you of your lab results and make any changes if needed Decrease the Olmesartan to 1/2 tab daily IF the BP starts consistently running in the 140s, let me know Call with any questions or concerns Happy Early Mother's Day!!!

## 2020-10-26 NOTE — Progress Notes (Signed)
   Subjective:    Patient ID: Jacqueline [name], female    DOB: 16-Nov-1934, 86 y.o.   MRN: [id number]  HPI HTN- at last visit we decided to hold her amlodipine due to what sounded like hypotensive episodes.  Pt's home BPs are running 110-150/63-79.  But typical readings are in the 110s and the 150 was a 1 time outlier.  Pt has determined she feels better w/ BP around 130.  Denies CP, SOB, HAs, visual changes.  Hypokalemia- pt's K+ was borderline at last visit so we started low dose K+ supplementation.   Review of Systems For ROS see HPI   This visit occurred during the SARS-CoV-2 public health emergency.  Safety protocols were in place, including screening questions prior to the visit, additional usage of staff PPE, and extensive cleaning of exam room while observing appropriate contact time as indicated for disinfecting solutions.       Objective:   Physical Exam Vitals reviewed.  Constitutional:      General: She is not in acute distress.    Appearance: Normal appearance. She is well-developed. She is not ill-appearing.  HENT:     Head: Normocephalic and atraumatic.  Eyes:     Conjunctiva/sclera: Conjunctivae normal.     Pupils: Pupils are equal, round, and reactive to light.  Neck:     Thyroid: No thyromegaly.  Cardiovascular:     Rate and Rhythm: Normal rate and regular rhythm.     Pulses: Normal pulses.     Heart sounds: Normal heart sounds. No murmur heard.   Pulmonary:     Effort: Pulmonary effort is normal. No respiratory distress.     Breath sounds: Normal breath sounds.  Abdominal:     General: There is no distension.     Palpations: Abdomen is soft.     Tenderness: There is no abdominal tenderness.  Musculoskeletal:     Cervical back: Normal range of motion and neck supple.     Right lower leg: No edema.     Left lower leg: No edema.  Lymphadenopathy:     Cervical: No cervical adenopathy.  Skin:    General: Skin is warm and dry.  Neurological:     Mental  Status: She is alert and oriented to person, place, and time.  Psychiatric:        Behavior: Behavior normal.           Assessment & Plan:  Hypokalemia- pt is feeling better since starting the potassium supplement.  Check labs today and adjust as needed

## 2020-10-26 NOTE — Assessment & Plan Note (Signed)
BP remains well controlled since stopping the Amlodipine.  She continues to run in the 110s most of the time.  Based on this and the observation that she feels best when her BPs are 120s-130s, will decrease Olmesartan to 1/2 tab daily.  Pt expressed understanding and is in agreement w/ plan.

## 2020-11-30 ENCOUNTER — Other Ambulatory Visit: Payer: Self-pay | Admitting: Family Medicine

## 2020-12-13 ENCOUNTER — Other Ambulatory Visit: Payer: Self-pay | Admitting: Family Medicine

## 2020-12-14 ENCOUNTER — Other Ambulatory Visit: Payer: Self-pay | Admitting: Family Medicine

## 2020-12-24 ENCOUNTER — Other Ambulatory Visit: Payer: Self-pay | Admitting: Family Medicine

## 2020-12-27 ENCOUNTER — Encounter: Payer: Self-pay | Admitting: *Deleted

## 2020-12-28 ENCOUNTER — Ambulatory Visit: Payer: Medicare Other | Admitting: Family Medicine

## 2020-12-29 ENCOUNTER — Ambulatory Visit: Payer: Medicare Other | Admitting: Family Medicine

## 2021-01-08 ENCOUNTER — Ambulatory Visit: Payer: Medicare Other | Admitting: Family Medicine

## 2021-01-08 ENCOUNTER — Other Ambulatory Visit: Payer: Self-pay

## 2021-01-08 ENCOUNTER — Encounter: Payer: Self-pay | Admitting: Family Medicine

## 2021-01-08 ENCOUNTER — Ambulatory Visit (INDEPENDENT_AMBULATORY_CARE_PROVIDER_SITE_OTHER): Payer: Medicare Other | Admitting: *Deleted

## 2021-01-08 VITALS — BP 118/78 | HR 61 | Temp 97.3°F | Ht 62.0 in | Wt 161.8 lb

## 2021-01-08 DIAGNOSIS — E785 Hyperlipidemia, unspecified: Secondary | ICD-10-CM | POA: Diagnosis not present

## 2021-01-08 DIAGNOSIS — Z0000 Encounter for general adult medical examination without abnormal findings: Secondary | ICD-10-CM | POA: Diagnosis not present

## 2021-01-08 DIAGNOSIS — I10 Essential (primary) hypertension: Secondary | ICD-10-CM

## 2021-01-08 DIAGNOSIS — R490 Dysphonia: Secondary | ICD-10-CM

## 2021-01-08 DIAGNOSIS — K219 Gastro-esophageal reflux disease without esophagitis: Secondary | ICD-10-CM

## 2021-01-08 DIAGNOSIS — K582 Mixed irritable bowel syndrome: Secondary | ICD-10-CM | POA: Insufficient documentation

## 2021-01-08 LAB — BASIC METABOLIC PANEL
BUN: 18 mg/dL (ref 6–23)
CO2: 27 mEq/L (ref 19–32)
Calcium: 9.3 mg/dL (ref 8.4–10.5)
Chloride: 108 mEq/L (ref 96–112)
Creatinine, Ser: 1.09 mg/dL (ref 0.40–1.20)
GFR: 46.8 mL/min — ABNORMAL LOW (ref >60.00)
Glucose, Bld: 69 mg/dL — ABNORMAL LOW (ref 70–99)
Potassium: 3.9 mEq/L (ref 3.5–5.1)
Sodium: 140 mEq/L (ref 135–145)

## 2021-01-08 LAB — LIPID PANEL
Cholesterol: 175 mg/dL (ref 0–200)
HDL: 53 mg/dL (ref >39.00)
LDL Cholesterol: 96 mg/dL (ref 0–99)
NonHDL: 122.44
Total CHOL/HDL Ratio: 3
Triglycerides: 134 mg/dL (ref 0.0–149.0)
VLDL: 26.8 mg/dL (ref 0.0–40.0)

## 2021-01-08 LAB — CBC WITH DIFFERENTIAL/PLATELET
Basophils Absolute: 0 10*3/uL (ref 0.0–0.1)
Basophils Relative: 0.4 % (ref 0.0–3.0)
Eosinophils Absolute: 0.1 10*3/uL (ref 0.0–0.7)
Eosinophils Relative: 1.7 % (ref 0.0–5.0)
HCT: 39.6 % (ref 36.0–46.0)
Hemoglobin: 13.3 g/dL (ref 12.0–15.0)
Lymphocytes Relative: 30.6 % (ref 12.0–46.0)
Lymphs Abs: 1.8 10*3/uL (ref 0.7–4.0)
MCHC: 33.6 g/dL (ref 30.0–36.0)
MCV: 92.7 fl (ref 78.0–100.0)
Monocytes Absolute: 0.6 10*3/uL (ref 0.1–1.0)
Monocytes Relative: 10.2 % (ref 3.0–12.0)
Neutro Abs: 3.4 10*3/uL (ref 1.4–7.7)
Neutrophils Relative %: 57.1 % (ref 43.0–77.0)
Platelets: 161 10*3/uL (ref 150.0–400.0)
RBC: 4.27 Mil/uL (ref 3.87–5.11)
RDW: 13.5 % (ref 11.5–15.5)
WBC: 5.9 10*3/uL (ref 4.0–10.5)

## 2021-01-08 LAB — HEPATIC FUNCTION PANEL
ALT: 31 U/L (ref 0–35)
AST: 18 U/L (ref 0–37)
Albumin: 3.8 g/dL (ref 3.5–5.2)
Alkaline Phosphatase: 81 U/L (ref 39–117)
Bilirubin, Direct: 0.1 mg/dL (ref 0.0–0.3)
Total Bilirubin: 0.6 mg/dL (ref 0.2–1.2)
Total Protein: 6.4 g/dL (ref 6.0–8.3)

## 2021-01-08 LAB — TSH: TSH: 2.08 u[IU]/mL (ref 0.35–5.50)

## 2021-01-08 MED ORDER — FLUTICASONE PROPIONATE 50 MCG/ACT NA SUSP: 2.0000 | Freq: Every day | NASAL | 6 refills | Status: DC

## 2021-01-08 MED ORDER — DICYCLOMINE HCL 10 MG PO CAPS: 10.0000 mg | ORAL_CAPSULE | Freq: Three times a day (TID) | ORAL | 3 refills | Status: DC

## 2021-01-08 NOTE — Progress Notes (Signed)
   Subjective:    Patient ID: Jacqueline [name], female    DOB: 1935/03/03, 86 y.o.   MRN: [id number]  HPI HTN- chronic problem, previously on Amlodipine 5mg  daily, Olmesartan 40mg  daily.  At last visit we stopped the Amlodipine and decreased the Olmesartan to 20mg  daily.  Home BPs are running 116-130s/60-80.  Denies CP, SOB, HAs, visual changes, edema.  Hyperlipidemia- chronic problem, on Lipitor 10mg  daily.  No abd pain, N/V.  GERD- pt is on Omeprazole 20mg  and finds that she needs to take BID on occasion.  Notes sxs will worsen if she over eats or strays from her diet.    IBS- pt reports that she knows what she can and can't eat.  She can't eat spicy food and is lactose intolerance.  But now she finds that there is no particular pattern to the foods that will cause abd cramping and loose stools.  Pt does alternate between diarrhea and constipation but more diarrhea  Raspy voice- pt reports her voice completely cleared when she started OTC antihistamine.  Once voice was clear, she stopped medication.  She again noticed that voice was raspy and restarted the Zyrtec daily a few weeks ago but voice has not cleared like it did the first time.    Review of Systems For ROS see HPI   This visit occurred during the SARS-CoV-2 public health emergency.  Safety protocols were in place, including screening questions prior to the visit, additional usage of staff PPE, and extensive cleaning of exam room while observing appropriate contact time as indicated for disinfecting solutions.      Objective:   Physical Exam Vitals reviewed.  Constitutional:      General: She is not in acute distress.    Appearance: Normal appearance. She is well-developed. She is not ill-appearing.  HENT:     Head: Normocephalic and atraumatic.  Eyes:     Conjunctiva/sclera: Conjunctivae normal.     Pupils: Pupils are equal, round, and reactive to light.  Neck:     Thyroid: No thyromegaly.  Cardiovascular:     Rate and  Rhythm: Normal rate and regular rhythm.     Pulses: Normal pulses.     Heart sounds: Normal heart sounds. No murmur heard. Pulmonary:     Effort: Pulmonary effort is normal. No respiratory distress.     Breath sounds: Normal breath sounds.  Abdominal:     General: There is no distension.     Palpations: Abdomen is soft.     Tenderness: There is no abdominal tenderness.  Musculoskeletal:     Cervical back: Normal range of motion and neck supple.     Right lower leg: No edema.     Left lower leg: No edema.  Lymphadenopathy:     Cervical: No cervical adenopathy.  Skin:    General: Skin is warm and dry.  Neurological:     Mental Status: She is alert and oriented to person, place, and time.  Psychiatric:        Behavior: Behavior normal.          Assessment & Plan:   Raspy voice- recurrent problem for pt.  Sxs improved w/ addition of antihistamine but have since returned.  Pt admits to regular PND.  Start Flonase and monitor for improvement.  Pt expressed understanding and is in agreement w/ plan.

## 2021-01-08 NOTE — Assessment & Plan Note (Signed)
Deteriorated.  Reviewed dietary and lifestyle modifications that will improve sxs and discussed that it is ok to take a 2nd Omeprazole as needed.  Pt expressed understanding and is in agreement w/ plan.

## 2021-01-08 NOTE — Patient Instructions (Addendum)
Schedule your complete physical in 6 months We'll notify you of your lab results and make any changes if needed CONTINUE the Olmesartan 1/2 tab daily Continue the Omeprazole daily for reflux- you can add a 2nd dose if needed Continue to take the Cetirizine for allergies and we will add the Flonase- 2 sprays each nostril daily START the Dicyclomine 1 tab w/ each meal for the first few days and then as needed Call with any questions or concerns Hang in there!!!

## 2021-01-08 NOTE — Assessment & Plan Note (Signed)
New.  This has been ongoing for pt but lately she feels that things are worsening.  Increased cramping, increased loose stools and there seems to be no dietary pattern.  Start low dose Dicyclomine to improve sxs.  Encouraged increased fiber in diet.  Will follow.

## 2021-01-08 NOTE — Progress Notes (Signed)
Subjective:   Jacqueline [name] is a 86 y.o. female who presents for Medicare Annual (Subsequent) preventive examination.  I connected with  Tildon Husky on 01/08/21 by a  telephone enabled telemedicine application and verified that I am speaking with the correct person using two identifiers.   I discussed the limitations of evaluation and management by telemedicine. The patient expressed understanding and agreed to proceed.   Review of Systems    NA Cardiac Risk Factors include: advanced age (>21men, >72 women);diabetes mellitus;dyslipidemia;hypertension     Objective:    Today's Vitals   There is no height or weight on file to calculate BMI.  Advanced Directives 01/08/2021 06/25/2018 10/30/2017 09/04/2017 08/27/2017 07/22/2017 05/28/2017  Does Patient Have a Medical Advance Directive? Yes Yes Yes Yes Yes Yes Yes  Type of Paramedic of [place];Living will [place];Living will [place];Living will Living will Living will [place];Living will [place];Living will  Does patient want to make changes to medical advance directive? - - No - Patient declined No - Patient declined No - Patient declined - -  Copy of [place] in Chart? No - copy requested No - copy requested No - copy requested - - No - copy requested No - copy requested    Current Medications (verified) Outpatient Encounter Medications as of 01/08/2021  Medication Sig   acetaminophen (TYLENOL) 500 MG tablet Take 1,000 mg by mouth daily as needed for moderate pain or headache.   amLODipine (NORVASC) 5 MG tablet Take 5 mg by mouth daily.   atorvastatin (LIPITOR) 10 MG tablet TAKE 1 TABLET BY MOUTH  DAILY   cholecalciferol (VITAMIN D) 1000 units tablet Take 2,000 Units by mouth every evening.   diclofenac Sodium (VOLTAREN) 1 % GEL    dicyclomine (BENTYL) 10 MG capsule Take 1 capsule (10 mg total) by  mouth 4 (four) times daily -  before meals and at bedtime.   fluticasone (FLONASE) 50 MCG/ACT nasal spray Place 2 sprays into both nostrils daily.   latanoprost (XALATAN) 0.005 % ophthalmic solution SMARTSIG:In Eye(s)   loperamide (IMODIUM) 2 MG capsule Take 3 mg by mouth as needed for diarrhea or loose stools.    mirtazapine (REMERON) 15 MG tablet TAKE 1 TABLET BY MOUTH  DAILY AT BEDTIME   nystatin ointment (MYCOSTATIN) Apply 1 application topically every evening.   olmesartan (BENICAR) 40 MG tablet TAKE 1 TABLET BY MOUTH  DAILY   omeprazole (PRILOSEC OTC) 20 MG tablet Take 1 tablet (20 mg total) by mouth daily.   potassium chloride (KLOR-CON) 10 MEQ tablet TAKE 1 TABLET BY MOUTH EVERY DAY   QUEtiapine (SEROQUEL) 25 MG tablet TAKE 1 TABLET BY MOUTH  DAILY AT BEDTIME   No facility-administered encounter medications on file as of 01/08/2021.    Allergies (verified) Aspirin, Codeine sulfate, Hydrocodone-acetaminophen, and Lactose intolerance (gi)   History: Past Medical History:  Diagnosis Date   Arthritis    "was probably in my knees" (09/04/2017)   Bilateral breast cancer ([place]) 2007, 2012   breast- bilateral   Breast cancer ([place]) hx 2007   stage T1b grade 2 ER/positive,ductal ca left breast   Family history of adverse reaction to anesthesia    "granddaughters get PONV, wild, combative" (09/04/2017)   Family history of breast cancer    Family history of pancreatic cancer    GERD (gastroesophageal reflux disease)    Glaucoma, both eyes    History  of kidney stones    Hyperlipidemia    Hypertension    Migraine    "none in the 2000s" (09/04/2017)   Osteopenia    Personal history of chemotherapy    Personal history of radiation therapy    PONV (postoperative nausea and vomiting)    Right rotator cuff tear 08/2012   Ruptured disc, cervical    Skin cancer    "cut off face" (09/04/2017)   Sleep initiation dysfunction    Past Surgical History:  Procedure Laterality Date   BREAST BIOPSY  Bilateral    BREAST LUMPECTOMY WITH RADIOACTIVE SEED LOCALIZATION Right 06/14/2016   Procedure: RIGHT BREAST LUMPECTOMY WITH RADIOACTIVE SEED LOCALIZATION;  Surgeon: Autumn Messing III, MD;  Location: [facility];  Service: General;  Laterality: Right;   CATARACT EXTRACTION, BILATERAL Bilateral    CHOLECYSTECTOMY OPEN     COLONOSCOPY W/ BIOPSIES AND POLYPECTOMY     DILATION AND CURETTAGE OF UTERUS     GLAUCOMA SURGERY Bilateral    "lasered"   INCISION AND DRAINAGE BREAST ABSCESS Left 05/2006   Developed in the mammosite cavity and treated with I&D, packed and healed    [place]   "opened me up"   MASTECTOMY COMPLETE / SIMPLE W/ SENTINEL NODE BIOPSY Bilateral 09/04/2017   MASTECTOMY PARTIAL / LUMPECTOMY Left 2007   Dr Margot Chimes   MASTECTOMY PARTIAL / LUMPECTOMY  2012 and 2017   Right Dr Margot Chimes 2012, Dr. Marlou Starks 2017   [place] Bilateral 09/04/2017   Procedure: BILATERAL MASTECTOMIES WITH SENTINEL LYMPH NODE BIOPSY;  Surgeon: Jovita Kussmaul, MD;  Location: [facility];  Service: General;  Laterality: Bilateral;   PORTACATH PLACEMENT     "it's been removed" (09/04/2017)   REPLACEMENT TOTAL KNEE BILATERAL  ~ 2007`- ~ 2012   left-right   SKIN CANCER EXCISION     "face"   TONSILLECTOMY     WISDOM TOOTH EXTRACTION     Family History  Problem Relation Age of Onset   Hypertension Mother    Colon polyps Mother    Stroke Father    Cancer Brother        throat   Stroke Brother    Hyperlipidemia Sister    Hypertension Sister    Congenital heart disease Brother    Pancreatic cancer Other        dx in her 28s   Breast cancer Cousin        paternal cousin died in her 73s   Diabetes Paternal Uncle    Colon cancer Neg Hx    Stomach cancer Neg Hx    Social History   Socioeconomic History   Marital status: Married    Spouse name: Not on file   Number of children: Not on file   Years of education: Not on file   Highest  education level: Not on file  Occupational History   Not on file  Tobacco Use   Smoking status: Never   Smokeless tobacco: Never  Vaping Use   Vaping Use: Never used  Substance and Sexual Activity   Alcohol use: No    Alcohol/week: 0.0 standard drinks   Drug use: No   Sexual activity: Not on file  Other Topics Concern   Not on file  Social History Narrative   Not on file   Social Determinants of Health   Financial Resource Strain: Low Risk    Difficulty of Paying Living Expenses:  Not very hard  Food Insecurity: No Food Insecurity   Worried About Charity fundraiser in the Last Year: Never true   Ran Out of Food in the Last Year: Never true  Transportation Needs: No Transportation Needs   Lack of Transportation (Medical): No   Lack of Transportation (Non-Medical): No  Physical Activity: Insufficiently Active   Days of Exercise per Week: 4 days   Minutes of Exercise per Session: 30 min  Stress: No Stress Concern Present   Feeling of Stress : Not at all  Social Connections: Moderately Integrated   Frequency of Communication with Friends and Family: More than three times a week   Frequency of Social Gatherings with Friends and Family: More than three times a week   Attends Religious Services: More than 4 times per year   Active Member of [employer] or Organizations: No   Attends Music therapist: Never   Marital Status: Married    Tobacco Counseling Counseling given: Not Answered   Clinical Intake:  Pre-visit preparation completed: Yes  Pain : No/denies pain     Nutritional Risks: None Diabetes: No  How often do you need to have someone help you when you read instructions, pamphlets, or other written materials from your doctor or pharmacy?: 1 - Never  Diabetic? no  Interpreter Needed?: No  Information entered by :: Leroy Kennedy LPN   Activities of Daily Living In your present state of health, do you have any difficulty performing the following  activities: 01/08/2021 01/08/2021  Hearing? N N  Vision? N N  Difficulty concentrating or making decisions? N N  Walking or climbing stairs? N N  Dressing or bathing? N N  Doing errands, shopping? N N  Preparing Food and eating ? N -  Using the Toilet? N -  In the past six months, have you accidently leaked urine? N -  Do you have problems with loss of bowel control? N -  Managing your Medications? N -  Managing your Finances? N -  Housekeeping or managing your Housekeeping? N -  Some recent data might be hidden    Patient Care Team: Midge Minium, MD as PCP - General (Family Medicine) Luberta Mutter, MD as Consulting Physician (Ophthalmology) Kristeen Miss, MD as Consulting Physician (Neurosurgery) Elsie Saas, MD as Consulting Physician (Orthopedic Surgery) Jari Pigg, MD as Consulting Physician (Dermatology) Jovita Kussmaul, MD as Consulting Physician (General Surgery) Nicholas Lose, MD as Consulting Physician (Hematology and Oncology) Delice Bison Charlestine Massed, NP as Nurse Practitioner (Hematology and Oncology)  Indicate any recent Medical Services you may have received from other than [facility] providers in the past year (date may be approximate).     Assessment:   This is a routine wellness examination for [facility].  Hearing/Vision screen Hearing Screening - Comments:: No trouble hearing aids Vision Screening - Comments:: Dr. Thornell Mule [phone] last visit  Dietary issues and exercise activities discussed: Current Exercise Habits: Home exercise routine, Type of exercise: walking, Time (Minutes): 30, Frequency (Times/Week): 4, Weekly Exercise (Minutes/Week): 120, Intensity: Mild   Goals Addressed             This Visit's Progress    Patient Stated   On track    Maintain current health      Patient Stated       Keep maintaining current health        Depression Screen PHQ 2/9 Scores 01/08/2021 01/08/2021 10/26/2020 06/29/2020 12/28/2019 06/29/2019 11/13/2018   PHQ - 2 Score 0 0  0 0 0 0 0  PHQ- 9 Score 0 0 0 0 0 0 -    Fall Risk Fall Risk  01/08/2021 01/08/2021 10/26/2020 06/29/2020 12/28/2019  Falls in the past year? 0 0 0 0 0  Number falls in past yr: 0 0 0 0 0  Injury with Fall? 0 0 0 0 0  Risk for fall due to : - No Fall Risks No Fall Risks No Fall Risks -  Follow up Falls evaluation completed;Falls prevention discussed - - - Falls evaluation completed    FALL RISK PREVENTION PERTAINING TO THE HOME:  Any stairs in or around the home? Yes  If so, are there any without handrails? Yes  Home free of loose throw rugs in walkways, pet beds, electrical cords, etc? Yes  Adequate lighting in your home to reduce risk of falls? Yes   ASSISTIVE DEVICES UTILIZED TO PREVENT FALLS:  Life alert? No  Use of a cane, walker or w/c? No  Grab bars in the bathroom? Yes  Shower chair or bench in shower? No  Elevated toilet seat or a handicapped toilet? Yes   TIMED UP AND GO:  Was the test performed? No .  Tele-Health Visit     Cognitive Function:  Normal cognitive status assessed by direct observation by this Nurse Health Advisor. No abnormalities found.   MMSE - Mini Mental State Exam 06/25/2018 05/28/2017  Orientation to time 5 5  Orientation to Place 5 5  Registration 3 3  Attention/ Calculation 5 5  Recall 3 2  Language- name 2 objects 2 2  Language- repeat 1 1  Language- follow 3 step command 3 3  Language- read & follow direction 1 1  Write a sentence 1 1  Copy design 1 1  Total score 30 29        Immunizations Immunization History  Administered Date(s) Administered   Influenza Split 06/06/2011, 04/15/2012, 03/31/2013   Influenza Whole 04/08/2007, 04/02/2008, 04/20/2009, 04/23/2010   Influenza, High Dose Seasonal PF 02/09/2019, 04/23/2020   Influenza,inj,Quad PF,6+ Mos 02/21/2016, 05/12/2017, 02/21/2018   Influenza-Unspecified 03/31/2014, 03/02/2015   PFIZER Comirnaty(Gray Top)Covid-19 Tri-Sucrose Vaccine 10/01/2020    PFIZER(Purple Top)SARS-COV-2 Vaccination 07/22/2019, 08/12/2019, 04/05/2020   Pneumococcal Conjugate-13 06/30/2013   Pneumococcal Polysaccharide-23 07/01/2001, 04/08/2007, 05/17/2016   Td 07/01/2002   Tdap 06/08/2012   Zoster Recombinat (Shingrix) 02/24/2019, 10/12/2019   Zoster, Live 07/02/2003    TDAP status: Up to date  Flu Vaccine status: Up to date  Pneumococcal vaccine status: Up to date  Covid-19 vaccine status: Completed vaccines  Qualifies for Shingles Vaccine? No   Zostavax completed Yes   Shingrix Completed?: Yes  Screening Tests Health Maintenance  Topic Date Due   INFLUENZA VACCINE  01/29/2021   COVID-19 Vaccine (5 - Booster for Pfizer series) 01/31/2021   TETANUS/TDAP  06/08/2022   DEXA SCAN  Completed   PNA vac Low Risk Adult  Completed   Zoster Vaccines- Shingrix  Completed   HPV VACCINES  Aged Out    Health Maintenance  There are no preventive care reminders to display for this patient.  Colorectal cancer screening: No longer required.   Mammogram status: No longer required due to  .  Bone Density status: Ordered  . Pt provided with contact info and advised to call to schedule appt.  Lung Cancer Screening: (Low Dose CT Chest recommended if Age 61-80 years, 30 pack-year currently smoking OR have quit w/in 15years.) does not qualify.   Lung Cancer Screening Referral: na  Additional Screening:    Vision Screening: Recommended annual ophthalmology exams for early detection of glaucoma and other disorders of the eye. Is the patient up to date with their annual eye exam?  Yes  Who is the provider or what is the name of the office in which the patient attends annual eye exams? [facility] If pt is not established with a provider, would they like to be referred to a provider to establish care?  established .   Dental Screening: Recommended annual dental exams for proper oral hygiene  Community Resource Referral / Chronic Care Management: CRR required  this visit?  No   CCM required this visit?  No      Plan:     I have personally reviewed and noted the following in the patient's chart:   Medical and social history Use of alcohol, tobacco or illicit drugs  Current medications and supplements including opioid prescriptions.  Functional ability and status Nutritional status Physical activity Advanced directives List of other physicians Hospitalizations, surgeries, and ER visits in previous 12 months Vitals Screenings to include cognitive, depression, and falls Referrals and appointments  In addition, I have reviewed and discussed with patient certain preventive protocols, quality metrics, and best practice recommendations. A written personalized care plan for preventive services as well as general preventive health recommendations were provided to patient.     Leroy Kennedy, LPN   7/00/1749   Nurse Notes: na

## 2021-01-08 NOTE — Assessment & Plan Note (Signed)
Chronic problem.  Tolerating statin w/o difficulty.  Check labs.  Adjust meds prn  

## 2021-01-08 NOTE — Assessment & Plan Note (Signed)
Chronic problem.  Excellent control on Olmesartn 20mg  daily.  No changes at this time

## 2021-02-04 ENCOUNTER — Other Ambulatory Visit: Payer: Self-pay | Admitting: Hematology and Oncology

## 2021-03-07 ENCOUNTER — Other Ambulatory Visit: Payer: Self-pay | Admitting: Family Medicine

## 2021-03-09 ENCOUNTER — Telehealth: Payer: Self-pay | Admitting: Family Medicine

## 2021-03-09 NOTE — Telephone Encounter (Signed)
Patient would like someone to call her concerning headaches that she gets when using her phone or tablet.  Patient states that she has tried special glasses, therapy, etc. She would like to try accupuncture and would like to know Dr. Virgil Benedict opinion on this.Please advise.

## 2021-03-09 NOTE — Telephone Encounter (Signed)
Called and left a message for patient to return call to office concerning her headaches.

## 2021-03-09 NOTE — Telephone Encounter (Signed)
Spoke with patient for

## 2021-03-09 NOTE — Telephone Encounter (Signed)
I am fine with her trying acupuncture but if she is having headaches when using her phone/tablet, she may need an eye exam to make sure she doesn't need a new prescription

## 2021-03-12 NOTE — Telephone Encounter (Signed)
Pt is asking for a recommendation for acupuncture of who Dr Birdie Riddle would recommend that she see.  Pt call back [phone] if not answer you can leave a message

## 2021-03-13 NOTE — Telephone Encounter (Signed)
Patient just wants to know if you recommend someone for acupuncture? Please advise

## 2021-03-14 NOTE — Telephone Encounter (Signed)
Called and spoke with patient about concerns of Acupuncture. Per provider does not have the experience to recommend someone for this. Patient understood. No further concerns at this time.

## 2021-03-14 NOTE — Telephone Encounter (Signed)
I don't have enough experience w/ acupuncture to recommend someone.  I have had patients tell me that [facility] [address] is very good but I cannot personally vouch for that

## 2021-04-17 ENCOUNTER — Other Ambulatory Visit: Payer: Self-pay | Admitting: Family Medicine

## 2021-06-15 ENCOUNTER — Other Ambulatory Visit: Payer: Self-pay | Admitting: Family Medicine

## 2021-07-11 ENCOUNTER — Encounter: Payer: Self-pay | Admitting: Family Medicine

## 2021-07-11 ENCOUNTER — Ambulatory Visit (INDEPENDENT_AMBULATORY_CARE_PROVIDER_SITE_OTHER): Payer: Medicare Other | Admitting: Family Medicine

## 2021-07-11 VITALS — BP 132/86 | HR 75 | Temp 97.3°F | Resp 16 | Ht 62.0 in | Wt 158.2 lb

## 2021-07-11 DIAGNOSIS — E559 Vitamin D deficiency, unspecified: Secondary | ICD-10-CM

## 2021-07-11 DIAGNOSIS — I10 Essential (primary) hypertension: Secondary | ICD-10-CM | POA: Diagnosis not present

## 2021-07-11 DIAGNOSIS — Z0000 Encounter for general adult medical examination without abnormal findings: Secondary | ICD-10-CM | POA: Diagnosis not present

## 2021-07-11 LAB — CBC WITH DIFFERENTIAL/PLATELET
Basophils Absolute: 0 10*3/uL (ref 0.0–0.1)
Basophils Relative: 0.6 % (ref 0.0–3.0)
Eosinophils Absolute: 0.1 10*3/uL (ref 0.0–0.7)
Eosinophils Relative: 0.9 % (ref 0.0–5.0)
HCT: 42.3 % (ref 36.0–46.0)
Hemoglobin: 13.7 g/dL (ref 12.0–15.0)
Lymphocytes Relative: 25.1 % (ref 12.0–46.0)
Lymphs Abs: 1.6 10*3/uL (ref 0.7–4.0)
MCHC: 32.4 g/dL (ref 30.0–36.0)
MCV: 91 fl (ref 78.0–100.0)
Monocytes Absolute: 0.5 10*3/uL (ref 0.1–1.0)
Monocytes Relative: 8 % (ref 3.0–12.0)
Neutro Abs: 4.2 10*3/uL (ref 1.4–7.7)
Neutrophils Relative %: 65.4 % (ref 43.0–77.0)
Platelets: 158 10*3/uL (ref 150.0–400.0)
RBC: 4.65 Mil/uL (ref 3.87–5.11)
RDW: 13 % (ref 11.5–15.5)
WBC: 6.4 10*3/uL (ref 4.0–10.5)

## 2021-07-11 LAB — LIPID PANEL
Cholesterol: 149 mg/dL (ref 0–200)
HDL: 39.5 mg/dL (ref >39.00)
LDL Cholesterol: 81 mg/dL (ref 0–99)
NonHDL: 109.99
Total CHOL/HDL Ratio: 4
Triglycerides: 146 mg/dL (ref 0.0–149.0)
VLDL: 29.2 mg/dL (ref 0.0–40.0)

## 2021-07-11 LAB — HEPATIC FUNCTION PANEL
ALT: 22 U/L (ref 0–35)
AST: 19 U/L (ref 0–37)
Albumin: 4 g/dL (ref 3.5–5.2)
Alkaline Phosphatase: 110 U/L (ref 39–117)
Bilirubin, Direct: 0.1 mg/dL (ref 0.0–0.3)
Total Bilirubin: 0.7 mg/dL (ref 0.2–1.2)
Total Protein: 6.8 g/dL (ref 6.0–8.3)

## 2021-07-11 LAB — BASIC METABOLIC PANEL
BUN: 19 mg/dL (ref 6–23)
CO2: 28 mEq/L (ref 19–32)
Calcium: 9 mg/dL (ref 8.4–10.5)
Chloride: 106 mEq/L (ref 96–112)
Creatinine, Ser: 1.16 mg/dL (ref 0.40–1.20)
GFR: 43.28 mL/min — ABNORMAL LOW (ref >60.00)
Glucose, Bld: 78 mg/dL (ref 70–99)
Potassium: 4.1 mEq/L (ref 3.5–5.1)
Sodium: 140 mEq/L (ref 135–145)

## 2021-07-11 LAB — VITAMIN D 25 HYDROXY (VIT D DEFICIENCY, FRACTURES): VITD: 52.75 ng/mL (ref 30.00–100.00)

## 2021-07-11 LAB — TSH: TSH: 1.42 u[IU]/mL (ref 0.35–5.50)

## 2021-07-11 NOTE — Assessment & Plan Note (Signed)
Check labs and replete prn. 

## 2021-07-11 NOTE — Progress Notes (Signed)
° °  Subjective:    Patient ID: Jacqueline [name], female    DOB: June 18, 1935, 87 y.o.   MRN: [id number]  HPI CPE- UTD on PNA vaccines, Tdap, flu.  UTD on colonoscopy and no longer needed.  Patient Care Team    Relationship Specialty Notifications Start End  Midge Minium, MD PCP - General Family Medicine  11/28/15   Luberta Mutter, MD Consulting Physician Ophthalmology  05/17/16   Kristeen Miss, MD Consulting Physician Neurosurgery  02/26/17   Elsie Saas, MD Consulting Physician Orthopedic Surgery  05/28/17   Jari Pigg, MD Consulting Physician Dermatology  05/28/17   Jovita Kussmaul, MD Consulting Physician General Surgery  12/10/17   Nicholas Lose, MD Consulting Physician Hematology and Oncology  12/10/17   Gardenia Phlegm, NP Nurse Practitioner Hematology and Oncology  12/10/17     Health Maintenance  Topic Date Due   TETANUS/TDAP  06/08/2022   Pneumonia Vaccine 26+ Years old  Completed   INFLUENZA VACCINE  Completed   DEXA SCAN  Completed   COVID-19 Vaccine  Completed   Zoster Vaccines- Shingrix  Completed   HPV VACCINES  Aged Out   COLONOSCOPY (Pts 45-28yrs Insurance coverage will need to be confirmed)  Discontinued      Review of Systems Patient reports no vision/ hearing changes, adenopathy,fever, weight change,  persistant/recurrent hoarseness , swallowing issues, chest pain, palpitations, edema, persistant/recurrent cough, hemoptysis, dyspnea (rest/exertional/paroxysmal nocturnal), gastrointestinal bleeding (melena, rectal bleeding), abdominal pain, significant heartburn, bowel changes, GU symptoms (dysuria, hematuria, incontinence), Gyn symptoms (abnormal  bleeding, pain),  syncope, focal weakness, memory loss, skin/nail changes, abnormal bruising or bleeding, anxiety, or depression.   + numbness of feet bilateral + hair loss  This visit occurred during the SARS-CoV-2 public health emergency.  Safety protocols were in place, including screening  questions prior to the visit, additional usage of staff PPE, and extensive cleaning of exam room while observing appropriate contact time as indicated for disinfecting solutions.      Objective:   Physical Exam General Appearance:    Alert, cooperative, no distress, appears stated age  Head:    Normocephalic, without obvious abnormality, atraumatic  Eyes:    PERRL, conjunctiva/corneas clear, EOM's intact, fundi    benign, both eyes  Ears:    Normal TM's and external ear canals, both ears  Nose:   Deferred due to COVID  Throat:   Neck:   Supple, symmetrical, trachea midline, no adenopathy;    Thyroid: no enlargement/tenderness/nodules  Back:     Symmetric, no curvature, ROM normal, no CVA tenderness  Lungs:     Clear to auscultation bilaterally, respirations unlabored  Chest Wall:    No tenderness or deformity   Heart:    Regular rate and rhythm, S1 and S2 normal, no murmur, rub   or gallop  Breast Exam:    Deferred- double mastectomy  Abdomen:     Soft, non-tender, bowel sounds active all four quadrants,    no masses, no organomegaly  Genitalia:    Deferred  Rectal:    Extremities:   Extremities normal, atraumatic, no cyanosis or edema  Pulses:   2+ and symmetric all extremities  Skin:   Skin color, texture, turgor normal, no rashes or lesions  Lymph nodes:   Cervical, supraclavicular, and axillary nodes normal  Neurologic:   CNII-XII intact, normal strength, sensation and reflexes    throughout          Assessment & Plan:

## 2021-07-11 NOTE — Patient Instructions (Signed)
Follow up in 6 months to recheck BP and cholesterol We'll notify you of your lab results and make any changes if needed Keep up the good work!  You look great! Call with any questions or concerns Stay Safe!  Stay Healthy! Happy New Year!!! 

## 2021-07-11 NOTE — Assessment & Plan Note (Signed)
Chronic problem.  Currently well controlled on 1/2 tab of Benicar.  Check labs due to ARB.  No anticipated med changes.

## 2021-07-11 NOTE — Assessment & Plan Note (Signed)
Pt's PE WNL.  UTD on immunizations.  No longer doing colonoscopy or mammo.  Doing remarkably well.  Check labs.  Anticipatory guidance provided.

## 2021-08-13 ENCOUNTER — Other Ambulatory Visit: Payer: Self-pay

## 2021-08-13 MED ORDER — FLUTICASONE PROPIONATE 50 MCG/ACT NA SUSP: 2.0000 | Freq: Every day | NASAL | 6 refills | Status: DC

## 2021-09-14 ENCOUNTER — Other Ambulatory Visit: Payer: Self-pay | Admitting: Family Medicine

## 2021-09-21 NOTE — Progress Notes (Signed)
? ?Patient Care Team: ?Midge Minium, MD as PCP - General (Family Medicine) ?Luberta Mutter, MD as Consulting Physician (Ophthalmology) ?Kristeen Miss, MD as Consulting Physician (Neurosurgery) ?Elsie Saas, MD as Consulting Physician (Orthopedic Surgery) ?Jari Pigg, MD as Consulting Physician (Dermatology) ?Jovita Kussmaul, MD as Consulting Physician (General Surgery) ?Nicholas Lose, MD as Consulting Physician (Hematology and Oncology) ?Gardenia Phlegm, NP as Nurse Practitioner (Hematology and Oncology) ? ?DIAGNOSIS:  ?Encounter Diagnosis  ?Name Primary?  ? Malignant neoplasm of upper-outer quadrant of right breast in female, estrogen receptor positive ([place])   ? ? ?SUMMARY OF ONCOLOGIC HISTORY: ?Oncology History  ?Malignant neoplasm of upper-outer quadrant of right breast in female, estrogen receptor positive ([place])  ?2007 Initial Biopsy  ? Stage I left breast cancer treated with lumpectomy and radiation and 5 years of tamoxifen ?  ?2012 Relapse/Recurrence  ? Right breast DCIS treated with lumpectomy and radiation, did not take antiestrogen therapy ?  ?06/14/2016 Surgery  ? Right lumpectomy 06/14/2016: LCIS with necrosis 3 cm, broadly involves the posterior margin, additional deep margin LCIS with necrosis 1.5 cm focally 0.1 cm from posterior margin.  Did not take antiestrogen therapy ?  ?07/10/2017 Initial Diagnosis  ? 7 mm right breast calcifications, stereotactic biopsy revealed invasive lobular cancer with LCIS ER 95%, PR 2%, Ki-67 5%, HER-2 negative ratio 1.31, T1 BN 0 stage I a clinical stage ?  ?08/06/2017 Genetic Testing  ? Negative genetic testing on the STAT cancer panel.  The STAT Breast cancer panel offered by [facility] includes sequencing and rearrangement analysis for the following 9 genes:  ATM, BRCA1, BRCA2, CDH1, CHEK2, PALB2, PTEN, STK11 and TP53.   The report date is July 28, 2017.  The report was reflexed to the common hereditary cancer panel.  Negative genetic testing on  the common hereditary cancer panel was reported out on August 07, 2107.  The Hereditary Gene Panel offered by [facility] includes sequencing and/or deletion duplication testing of the following 47 genes: APC, ATM, AXIN2, BARD1, BMPR1A, BRCA1, BRCA2, BRIP1, CDH1, CDK4, CDKN2A (p14ARF), CDKN2A (p16INK4a), CHEK2, CTNNA1, DICER1, EPCAM (Deletion/duplication testing only), GREM1 (promoter region deletion/duplication testing only), KIT, MEN1, MLH1, MSH2, MSH3, MSH6, MUTYH, NBN, NF1, NHTL1, PALB2, PDGFRA, PMS2, POLD1, POLE, PTEN, RAD50, RAD51C, RAD51D, SDHB, SDHC, SDHD, SMAD4, SMARCA4. STK11, TP53, TSC1, TSC2, and VHL.  The following genes were evaluated for sequence changes only: SDHA and HOXB13 c.251G>A variant only.  ? ?  ?09/04/2017 Surgery  ? Right mastectomy: Invasive lobular cancer with LCIS, 2.1 cm, grade 1, DCIS, margins negative, 0/3 lymph nodes negative ER 95%, PR 2%, HER-2 negative ratio 1.31, Ki-67 5%, T2N0 stage Ib ?Left mastectomy: IDC with DCIS 2.3 cm, grade 2, margins negative, 0/3 lymph nodes negative ER 100%, PR 40%, HER-2 negative ratio 1.55, Ki-67 40%, T2N0 stage Ib ?  ?08/2017 -  Anti-estrogen oral therapy  ? Letrozole switched to Aromasin and then switched to anastrozole 03/25/2018 due to hot flashes and depression ?  ? ? ?CHIEF COMPLIANT: Follow-up of bilateral breast cancers   ? ?INTERVAL HISTORY: Jacqueline [name] is a  87 y.o. with above-mentioned history of bilateral breast cancers who underwent bilateral mastectomies and is currently on antiestrogen therapy with anastrozole. She presents to the clinic today for annual follow-up. Denies of any pain.   ? ? ?ALLERGIES:  is allergic to aspirin, codeine sulfate, hydrocodone-acetaminophen, and lactose intolerance (gi). ? ?MEDICATIONS:  ?Current Outpatient Medications  ?Medication Sig Dispense Refill  ? acetaminophen (TYLENOL) 500 MG tablet Take 1,000 mg by  mouth daily as needed for moderate pain or headache.    ? atorvastatin (LIPITOR) 10 MG tablet TAKE  1 TABLET BY MOUTH  DAILY 90 tablet 3  ? cholecalciferol (VITAMIN D) 1000 units tablet Take 2,000 Units by mouth every evening.    ? diclofenac Sodium (VOLTAREN) 1 % GEL     ? fluticasone (FLONASE) 50 MCG/ACT nasal spray Place 2 sprays into both nostrils daily. 16 g 6  ? latanoprost (XALATAN) 0.005 % ophthalmic solution SMARTSIG:In Eye(s)    ? loperamide (IMODIUM) 2 MG capsule Take 3 mg by mouth as needed for diarrhea or loose stools.     ? mirtazapine (REMERON) 15 MG tablet TAKE 1 TABLET BY MOUTH  DAILY AT BEDTIME 90 tablet 3  ? olmesartan (BENICAR) 40 MG tablet TAKE 1 TABLET BY MOUTH  DAILY 90 tablet 3  ? omeprazole (PRILOSEC OTC) 20 MG tablet Take 1 tablet (20 mg total) by mouth daily. 90 tablet 4  ? potassium chloride (KLOR-CON) 10 MEQ tablet TAKE 1 TABLET BY MOUTH EVERY DAY 90 tablet 0  ? QUEtiapine (SEROQUEL) 25 MG tablet TAKE 1 TABLET BY MOUTH  DAILY AT BEDTIME 90 tablet 3  ? ?No current facility-administered medications for this visit.  ? ? ?PHYSICAL EXAMINATION: ?ECOG PERFORMANCE STATUS: 1 - Symptomatic but completely ambulatory ? ?Vitals:  ? 09/24/21 1038  ?BP: (!) 157/65  ?Pulse: 71  ?Resp: 18  ?Temp: (!) 97.3 ?F (36.3 ?C)  ?SpO2: 100%  ? ?Filed Weights  ? 09/24/21 1038  ?Weight: 160 lb 4.8 oz (72.7 kg)  ? ? ?BREAST: No palpable masses or nodules in either right or left chest wall or axilla. (exam performed in the presence of a chaperone) ? ?LABORATORY DATA:  ?I have reviewed the data as listed ? ?  Latest Ref Rng & Units 07/11/2021  ? 10:32 AM 01/08/2021  ?  9:35 AM 10/26/2020  ? 10:45 AM  ?CMP  ?Glucose 70 - 99 mg/dL 78   69   82    ?BUN 6 - 23 mg/dL $Remove'19   18   22    'mYsBCet$ ?Creatinine 0.40 - 1.20 mg/dL 1.16   1.09   1.23    ?Sodium 135 - 145 mEq/L 140   140   141    ?Potassium 3.5 - 5.1 mEq/L 4.1   3.9   3.8    ?Chloride 96 - 112 mEq/L 106   108   109    ?CO2 19 - 32 mEq/L $Remove'28   27   25    'lOQhPHO$ ?Calcium 8.4 - 10.5 mg/dL 9.0   9.3   9.1    ?Total Protein 6.0 - 8.3 g/dL 6.8   6.4     ?Total Bilirubin 0.2 - 1.2 mg/dL 0.7    0.6     ?Alkaline Phos 39 - 117 U/L 110   81     ?AST 0 - 37 U/L 19   18     ?ALT 0 - 35 U/L 22   31     ? ? ?Lab Results  ?Component Value Date  ? WBC 6.4 07/11/2021  ? HGB 13.7 07/11/2021  ? HCT 42.3 07/11/2021  ? MCV 91.0 07/11/2021  ? PLT 158.0 07/11/2021  ? NEUTROABS 4.2 07/11/2021  ? ? ?ASSESSMENT & PLAN:  ?Malignant neoplasm of upper-outer quadrant of right breast in female, estrogen receptor positive ([place]) ?Bilateral mastectomies 09/04/2017: ?Right mastectomy: Invasive lobular cancer with LCIS, 2.1 cm, grade 1, DCIS, margins negative, 0/3 lymph nodes negative  ER 95%, PR 2%, HER-2 negative ratio 1.31, Ki-67 5%, T2N0 stage Ib ?Left mastectomy: IDC with DCIS 2.3 cm, grade 2, margins negative, 0/3 lymph nodes negative ER 100%, PR 40%, HER-2 negative ratio 1.55, Ki-67 40%, T2N0 stage Ib ?  ?Current treatment: ?Antiestrogen therapy with letrozole 2.5 mg daily, switched to exemestane June 2019 and switched to anastrozole September 2019 due to hot flashes and memory/depression problems discontinued 2022 ?  ?Breast cancer surveillance: ?1.  Breast exam: 09/24/2021: Benign ?2. mammogram: No role of mammogram since she had bilateral mastectomies ?   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 ?Return to clinic on an as-needed basis ? ? ? ?No orders of the defined types were placed in this encounter. ? ?The patient has a good understanding of the overall plan. she agrees with it. she will call with any problems that may develop before the next visit here. ?Total time spent: 30 mins including face to face time and time spent for planning, charting and co-ordination of care ? ? Harriette Ohara, MD ?09/24/21 ? ? ? I Gardiner Coins am scribing for Dr. Lindi Adie ? ?I have reviewed the above documentation for accuracy and completeness, and I agree with the above. ?  ?

## 2021-09-24 ENCOUNTER — Encounter: Payer: Self-pay | Admitting: *Deleted

## 2021-09-24 ENCOUNTER — Inpatient Hospital Stay: Payer: Medicare Other | Attending: Hematology and Oncology | Admitting: Hematology and Oncology

## 2021-09-24 ENCOUNTER — Other Ambulatory Visit: Payer: Self-pay

## 2021-09-24 DIAGNOSIS — C50912 Malignant neoplasm of unspecified site of left female breast: Secondary | ICD-10-CM | POA: Diagnosis not present

## 2021-09-24 DIAGNOSIS — Z170 Estrogen receptor positive status [ER+]: Secondary | ICD-10-CM | POA: Diagnosis not present

## 2021-09-24 DIAGNOSIS — Z79811 Long term (current) use of aromatase inhibitors: Secondary | ICD-10-CM | POA: Diagnosis not present

## 2021-09-24 DIAGNOSIS — C50411 Malignant neoplasm of upper-outer quadrant of right female breast: Secondary | ICD-10-CM | POA: Diagnosis present

## 2021-09-24 DIAGNOSIS — Z9013 Acquired absence of bilateral breasts and nipples: Secondary | ICD-10-CM | POA: Diagnosis not present

## 2021-09-24 DIAGNOSIS — Z79899 Other long term (current) drug therapy: Secondary | ICD-10-CM | POA: Diagnosis not present

## 2021-09-24 NOTE — Assessment & Plan Note (Addendum)
Bilateral mastectomies 09/04/2017: ?Right mastectomy: Invasive lobular cancer with LCIS, 2.1 cm, grade 1, DCIS, margins negative, 0/3 lymph nodes negative ER 95%, PR 2%, HER-2 negative ratio 1.31, Ki-67 5%, T2N0 stage Ib ?Left mastectomy: IDC with DCIS 2.3 cm, grade 2, margins negative, 0/3 lymph nodes negative ER 100%, PR 40%, HER-2 negative ratio 1.55, Ki-67 40%, T2N0 stage Ib ?? ?Current treatment: ?Antiestrogen therapy with letrozole 2.5 mg daily, switched to exemestane June 2019 and switched to anastrozole September 2019 due to hot flashes and memory/depression problems discontinued 2022 ?? ?Breast cancer surveillance: ?1.??Breast exam:?09/24/2021: Benign ?2.?mammogram: No role of mammogram since she had bilateral mastectomies ??  ?Return to clinic on an as-needed basis ?

## 2021-09-24 NOTE — Progress Notes (Signed)
Received call from [place], [place] [phone]) with dermatology specialists requesting if okay for pt to proceed with spirolactone or finasteride for treatment of hair loss.  Per MD okay for pt to proceed with tx.  ?

## 2021-09-28 ENCOUNTER — Other Ambulatory Visit: Payer: Self-pay | Admitting: Family Medicine

## 2021-10-01 ENCOUNTER — Other Ambulatory Visit: Payer: Self-pay

## 2021-10-01 ENCOUNTER — Emergency Department (HOSPITAL_BASED_OUTPATIENT_CLINIC_OR_DEPARTMENT_OTHER)
Admission: EM | Admit: 2021-10-01 | Discharge: 2021-10-01 | Disposition: A | Discharge status: Home / Self Care | Payer: Medicare Other | Attending: Emergency Medicine | Admitting: Emergency Medicine

## 2021-10-01 ENCOUNTER — Telehealth: Payer: Self-pay

## 2021-10-01 DIAGNOSIS — R519 Headache, unspecified: Secondary | ICD-10-CM | POA: Diagnosis not present

## 2021-10-01 DIAGNOSIS — R42 Dizziness and giddiness: Secondary | ICD-10-CM | POA: Diagnosis present

## 2021-10-01 DIAGNOSIS — I10 Essential (primary) hypertension: Secondary | ICD-10-CM | POA: Insufficient documentation

## 2021-10-01 DIAGNOSIS — Z853 Personal history of malignant neoplasm of breast: Secondary | ICD-10-CM | POA: Diagnosis not present

## 2021-10-01 DIAGNOSIS — Z79899 Other long term (current) drug therapy: Secondary | ICD-10-CM | POA: Diagnosis not present

## 2021-10-01 DIAGNOSIS — Z20822 Contact with and (suspected) exposure to covid-19: Secondary | ICD-10-CM | POA: Insufficient documentation

## 2021-10-01 LAB — HEPATIC FUNCTION PANEL
ALT: 23 U/L (ref 0–44)
AST: 23 U/L (ref 15–41)
Albumin: 4.2 g/dL (ref 3.5–5.0)
Alkaline Phosphatase: 98 U/L (ref 38–126)
Bilirubin, Direct: 0.1 mg/dL (ref 0.0–0.2)
Indirect Bilirubin: 0.5 mg/dL (ref 0.3–0.9)
Total Bilirubin: 0.6 mg/dL (ref 0.3–1.2)
Total Protein: 7.4 g/dL (ref 6.5–8.1)

## 2021-10-01 LAB — MAGNESIUM: Magnesium: 2.2 mg/dL (ref 1.7–2.4)

## 2021-10-01 LAB — CBC
HCT: 42 % (ref 36.0–46.0)
Hemoglobin: 13.7 g/dL (ref 12.0–15.0)
MCH: 29.6 pg (ref 26.0–34.0)
MCHC: 32.6 g/dL (ref 30.0–36.0)
MCV: 90.7 fL (ref 80.0–100.0)
Platelets: 145 10*3/uL — ABNORMAL LOW (ref 150–400)
RBC: 4.63 MIL/uL (ref 3.87–5.11)
RDW: 13.1 % (ref 11.5–15.5)
WBC: 6.6 10*3/uL (ref 4.0–10.5)
nRBC: 0 % (ref 0.0–0.2)

## 2021-10-01 LAB — URINALYSIS, ROUTINE W REFLEX MICROSCOPIC
Bilirubin Urine: NEGATIVE
Glucose, UA: NEGATIVE mg/dL
Ketones, ur: NEGATIVE mg/dL
Nitrite: NEGATIVE
Specific Gravity, Urine: 1.015 (ref 1.005–1.030)
pH: 6.5 (ref 5.0–8.0)

## 2021-10-01 LAB — BASIC METABOLIC PANEL
Anion gap: 6 (ref 5–15)
BUN: 18 mg/dL (ref 8–23)
CO2: 26 mmol/L (ref 22–32)
Calcium: 9.6 mg/dL (ref 8.9–10.3)
Chloride: 109 mmol/L (ref 98–111)
Creatinine, Ser: 0.96 mg/dL (ref 0.44–1.00)
GFR, Estimated: 58 mL/min — ABNORMAL LOW (ref >60)
Glucose, Bld: 97 mg/dL (ref 70–99)
Potassium: 4.1 mmol/L (ref 3.5–5.1)
Sodium: 141 mmol/L (ref 135–145)

## 2021-10-01 LAB — URINALYSIS, MICROSCOPIC (REFLEX)

## 2021-10-01 LAB — RESP PANEL BY RT-PCR (FLU A&B, COVID) ARPGX2
Influenza A by PCR: NEGATIVE
Influenza B by PCR: NEGATIVE
SARS Coronavirus 2 by RT PCR: NEGATIVE

## 2021-10-01 LAB — CBG MONITORING, ED: Glucose-Capillary: 80 mg/dL (ref 70–99)

## 2021-10-01 MED ORDER — SODIUM CHLORIDE 0.9 % IV BOLUS
1000.0000 mL | Freq: Once | INTRAVENOUS | Status: AC
  Administered 2021-10-01: 1000 mL via INTRAVENOUS

## 2021-10-01 NOTE — ED Notes (Signed)
Pt stated she did not get dizzy at any point doing her Orthostatic Vitals. Pt also ambulated to Bathroom with little to No assistance.  ?

## 2021-10-01 NOTE — Discharge Instructions (Signed)
Please increase your blood pressure medication to one pill daily. Please follow up with your PCP. If you develop any worsening or concerning symptoms, please return to the emergency department.  ?

## 2021-10-01 NOTE — Telephone Encounter (Signed)
Nurse Assessment ?Nurse: Markus Daft, RN, Sherre Poot Date/Time Eilene Ghazi Time): 10/01/2021 11:41:38 AM ?Confirm and document reason for call. If symptomatic, describe symptoms. ?---Caller state that she woke up c/o dizziness. Took BP 160/79. Normally takes Olmesartan 20 mg 1/2 tab but today, she took one tablet d/t HTN. Rechecked BP about 60 min later, and BP 148/76. Then felt tired, HA, and slightly dizzy around 11:30 am, and latest BP med was 172/90. ? ?Does the patient have any new or worsening symptoms? ---Yes ?Will a triage be completed? ---Yes ?Related visit to physician within the last 2 weeks? ---No ?Does the PT have any chronic conditions? (i.e. diabetes, asthma, this includes High risk factors for pregnancy, etc.) ---Yes ?List chronic conditions. ---HTN, High cholesterol ?Is this a behavioral health or substance abuse call? ---No ? ?Blood Pressure - High ?[6] Systolic BP >= 387 OR Diastolic >= 564 AND [3] ?cardiac or neurologic symptoms (e.g., chest pain, difficulty breathing, unsteady gait, blurred vision) ?Markus Daft RN, Sherre Poot 10/01/2021 11:46:26 AM ? ?10/01/2021 11:54:05 AM Called On-Call Provider Markus Daft, RN, Sherre Poot ?Reason: Caller would rather be seen at office. RN notified staff at office - Her MD does not have any appts. - Caller notified, and will go to a doctor somehow, unsure about ED still  ? ? ?

## 2021-10-01 NOTE — Telephone Encounter (Signed)
Patient called in asking if we had any available appointments, advised that our office did not have anything for this week. Patient verbalized understanding and said she will go to UC.  ?

## 2021-10-01 NOTE — ED Triage Notes (Signed)
Pt to ED c/o dizziness that started this morning. HX HTN. Reports blood pressure was elevated this morning. Denies pain.  ?

## 2021-10-01 NOTE — ED Provider Notes (Signed)
I provided a substantive portion of the care of this patient.  I personally performed the entirety of the medical decision making for this encounter. ? ?EKG Interpretation ? ?Date/Time:  Monday October 01 2021 12:41:32 EDT ?Ventricular Rate:  66 ?PR Interval:  164 ?QRS Duration: 72 ?QT Interval:  398 ?QTC Calculation: 417 ?R Axis:   38 ?Text Interpretation: Normal sinus rhythm Cannot rule out Anterior infarct , age undetermined Abnormal ECG When compared with ECG of 27-Aug-2017 09:54, No significant change was found No significant change since last tracing Confirmed by Lacretia Leigh ([id number]) on 10/01/2021 2:38:05 PM  ? ?Patient here complaining of dizziness that was positional.  Also noted increased blood pressure as well 2.  Taking extra dose of her blood pressure medication here.  Work-up here is reassuring with exception of possible UTI.  Patient be instructed to use at home blood pressure pill and follow with her doctor this week ?  ?Lacretia Leigh, MD ?10/01/21 1523 ? ?

## 2021-10-01 NOTE — ED Provider Notes (Signed)
?[facility] ?Provider Note ? ? ?CSN: [id number] ?Arrival date & time: 10/01/21  1230 ? ?  ? ?History ?PMH: HTN, HLD, Osteopenia, GERD, bilateral glaucoma, hx of right breast cancer no longer on estrogen therapy s/p bilateral mastectomy ? ? ?Chief Complaint  ?Patient presents with  ? Dizziness  ? ? ?Jacqueline [name] is a 87 y.o. female. ?This emergency department with a chief complaint of dizziness.  She says she noticed this morning when she was laying in bed and she slowly lifted her head up as she had some lightheadedness symptoms and felt dizzy.  She says she immediately put her head back down.  She feels like if she would have continued to try to sit up, she would have passed out.  This happened 3 more times as she tried to sit up.  The symptoms would dissipate as she went to rest.  She eventually slowly got herself up without having symptom recurrence.  Says she felt little fatigued throughout the day, but did not have any more dizziness symptoms.  She states she took a nap she was feeling tired, and she had symptoms recur slightly from the nap.  She describes them as lightheadedness.  Denies any visual symptoms, speech changes, gait abnormalities, numbness, weakness, fevers, chest pain, shortness of breath, abdominal pain, nausea, vomiting, diarrhea.  She did note that earlier today she felt like her blood pressure was more elevated than normal.  She says that it was in the 027X systolic at home which is very high for her.  She states that she normally runs in the 130s.  She takes Benicar at home, and ended up taking double her dose because she thought her blood pressure was high.  She is continually rechecked throughout the day and it was noted to be in the 130s and 140s. She does state that she has a slight headache which is not unusual for her as she has a history of chronic headaches and has been worked up numerous times for this and underwent multiple treatments.   ? ? ?Dizziness ?Associated symptoms: headaches   ?Associated symptoms: no blood in stool, no chest pain, no diarrhea, no nausea, no shortness of breath, no vomiting and no weakness   ? ?  ? ?Home Medications ?Prior to Admission medications   ?Medication Sig Start Date End Date Taking? Authorizing Provider  ?acetaminophen (TYLENOL) 500 MG tablet Take 1,000 mg by mouth daily as needed for moderate pain or headache.    [provider]  ?atorvastatin (LIPITOR) 10 MG tablet TAKE 1 TABLET BY MOUTH  DAILY 04/17/21   Midge Minium, MD  ?cholecalciferol (VITAMIN D) 1000 units tablet Take 2,000 Units by mouth every evening.    [provider]  ?diclofenac Sodium (VOLTAREN) 1 % GEL  06/11/19   [provider]  ?fluticasone (FLONASE) 50 MCG/ACT nasal spray Place 2 sprays into both nostrils daily. 08/13/21   Midge Minium, MD  ?latanoprost (XALATAN) 0.005 % ophthalmic solution SMARTSIG:In Eye(s) 06/28/20   [provider]  ?loperamide (IMODIUM) 2 MG capsule Take 3 mg by mouth as needed for diarrhea or loose stools.     [provider]  ?mirtazapine (REMERON) 15 MG tablet TAKE 1 TABLET BY MOUTH  DAILY AT BEDTIME 01/10/20   Midge Minium, MD  ?olmesartan (BENICAR) 40 MG tablet TAKE 1 TABLET BY MOUTH  DAILY 11/30/20   Midge Minium, MD  ?omeprazole (PRILOSEC OTC) 20 MG tablet Take 1 tablet (20 mg  total) by mouth daily. 06/15/12   Dorena Cookey, MD  ?potassium chloride (KLOR-CON) 10 MEQ tablet TAKE 1 TABLET BY MOUTH EVERY DAY 09/14/21   Midge Minium, MD  ?QUEtiapine (SEROQUEL) 25 MG tablet TAKE 1 TABLET BY MOUTH  DAILY AT BEDTIME 10/01/21   Midge Minium, MD  ?   ? ?Allergies    ?Aspirin, Codeine sulfate, Hydrocodone-acetaminophen, and Lactose intolerance (gi)   ? ?Review of Systems   ?Review of Systems  ?Constitutional:  Positive for fatigue. Negative for fever.  ?HENT:  Negative for congestion and sore throat.   ?Eyes:  Negative for photophobia and  visual disturbance.  ?Respiratory:  Negative for cough and shortness of breath.   ?Cardiovascular:  Negative for chest pain and leg swelling.  ?Gastrointestinal:  Negative for abdominal pain, blood in stool, diarrhea, nausea and vomiting.  ?Musculoskeletal:  Negative for back pain, neck pain and neck stiffness.  ?Neurological:  Positive for dizziness, syncope, light-headedness and headaches. Negative for tremors, seizures, facial asymmetry, speech difficulty, weakness and numbness.  ?All other systems reviewed and are negative. ? ?Physical Exam ?Updated Vital Signs ?BP (!) 163/79   Pulse 63   Temp 98.1 ?F (36.7 ?C)   Resp 14   Ht '5\' 3"'$  (1.6 m)   Wt 71.2 kg   SpO2 99%   BMI 27.81 kg/m?  ?Physical Exam ?Vitals and nursing note reviewed.  ?Constitutional:   ?   General: She is not in acute distress. ?   Appearance: Normal appearance. She is not ill-appearing, toxic-appearing or diaphoretic.  ?HENT:  ?   Head: Normocephalic and atraumatic.  ?   Right Ear: Tympanic membrane, ear canal and external ear normal. There is no impacted cerumen.  ?   Left Ear: Tympanic membrane, ear canal and external ear normal. There is no impacted cerumen.  ?   Nose: No nasal deformity, congestion or rhinorrhea.  ?   Mouth/Throat:  ?   Lips: Pink. No lesions.  ?   Mouth: Mucous membranes are moist. No injury, lacerations, oral lesions or angioedema.  ?   Pharynx: Oropharynx is clear. Uvula midline. No pharyngeal swelling, oropharyngeal exudate, posterior oropharyngeal erythema or uvula swelling.  ?Eyes:  ?   General: Gaze aligned appropriately. No scleral icterus.    ?   Right eye: No discharge.     ?   Left eye: No discharge.  ?   Conjunctiva/sclera: Conjunctivae normal.  ?   Right eye: Right conjunctiva is not injected. No exudate or hemorrhage. ?   Left eye: Left conjunctiva is not injected. No exudate or hemorrhage. ?   Pupils: Pupils are equal, round, and reactive to light.  ?Cardiovascular:  ?   Rate and Rhythm: Normal rate and  regular rhythm.  ?   Pulses: Normal pulses.     ?     Radial pulses are 2+ on the right side and 2+ on the left side.  ?     Dorsalis pedis pulses are 2+ on the right side and 2+ on the left side.  ?   Heart sounds: Normal heart sounds, S1 normal and S2 normal. Heart sounds not distant. No murmur heard. ?  No friction rub. No gallop. No S3 or S4 sounds.  ?Pulmonary:  ?   Effort: Pulmonary effort is normal. No accessory muscle usage or respiratory distress.  ?   Breath sounds: Normal breath sounds. No stridor. No wheezing, rhonchi or rales.  ?Chest:  ?   Chest wall: No  tenderness.  ?Abdominal:  ?   General: Abdomen is flat. Bowel sounds are normal. There is no distension.  ?   Palpations: Abdomen is soft. There is no mass or pulsatile mass.  ?   Tenderness: There is no abdominal tenderness. There is no right CVA tenderness, left CVA tenderness, guarding or rebound.  ?Musculoskeletal:  ?   Cervical back: Neck supple. No tenderness.  ?   Right lower leg: No edema.  ?   Left lower leg: No edema.  ?Skin: ?   General: Skin is warm and dry.  ?   Coloration: Skin is not jaundiced or pale.  ?   Findings: No bruising, erythema, lesion or rash.  ?Neurological:  ?   General: No focal deficit present.  ?   Mental Status: She is alert and oriented to person, place, and time.  ?   GCS: GCS eye subscore is 4. GCS verbal subscore is 5. GCS motor subscore is 6.  ?   Comments: Alert and Oriented x 3 ?Speech clear with no aphasia ?Cranial Nerve testing ?- PERRLA. EOM intact. No Nystagmus ?- Facial Sensation grossly intact ?- No facial asymmetry ?- Uvula and Tongue Midline ?- Accessory Muscles intact ?Motor: ?- 5/5 motor strength in all four extremities.  ?Sensation: ?- Grossly intact in all four extremities.  ?Coordination:  ?- Finger to nose and heel to shin intact bilaterally ?  ?Psychiatric:     ?   Mood and Affect: Mood normal.     ?   Behavior: Behavior normal. Behavior is cooperative.  ? ? ?ED Results / Procedures / Treatments    ?Labs ?(all labs ordered are listed, but only abnormal results are displayed) ?Labs Reviewed  ?BASIC METABOLIC PANEL - Abnormal; Notable for the following components:  ?    Result Value  ? GFR, Estimated 58 (*)   ? All other

## 2021-10-01 NOTE — Telephone Encounter (Signed)
Pt is on the schedule for tomorrow

## 2021-10-02 ENCOUNTER — Encounter: Payer: Self-pay | Admitting: Family Medicine

## 2021-10-02 ENCOUNTER — Ambulatory Visit: Payer: Medicare Other | Admitting: Family Medicine

## 2021-10-02 VITALS — BP 124/82 | HR 75 | Temp 97.7°F | Resp 16 | Wt 161.4 lb

## 2021-10-02 DIAGNOSIS — R42 Dizziness and giddiness: Secondary | ICD-10-CM | POA: Diagnosis not present

## 2021-10-02 DIAGNOSIS — L649 Androgenic alopecia, unspecified: Secondary | ICD-10-CM | POA: Insufficient documentation

## 2021-10-02 DIAGNOSIS — L650 Telogen effluvium: Secondary | ICD-10-CM | POA: Insufficient documentation

## 2021-10-02 DIAGNOSIS — Z411 Encounter for cosmetic surgery: Secondary | ICD-10-CM | POA: Insufficient documentation

## 2021-10-02 DIAGNOSIS — L900 Lichen sclerosus et atrophicus: Secondary | ICD-10-CM | POA: Insufficient documentation

## 2021-10-02 DIAGNOSIS — L578 Other skin changes due to chronic exposure to nonionizing radiation: Secondary | ICD-10-CM | POA: Insufficient documentation

## 2021-10-02 DIAGNOSIS — L814 Other melanin hyperpigmentation: Secondary | ICD-10-CM | POA: Insufficient documentation

## 2021-10-02 DIAGNOSIS — I10 Essential (primary) hypertension: Secondary | ICD-10-CM | POA: Diagnosis not present

## 2021-10-02 DIAGNOSIS — D692 Other nonthrombocytopenic purpura: Secondary | ICD-10-CM | POA: Insufficient documentation

## 2021-10-02 DIAGNOSIS — D2370 Other benign neoplasm of skin of unspecified lower limb, including hip: Secondary | ICD-10-CM | POA: Insufficient documentation

## 2021-10-02 DIAGNOSIS — D485 Neoplasm of uncertain behavior of skin: Secondary | ICD-10-CM | POA: Insufficient documentation

## 2021-10-02 DIAGNOSIS — I872 Venous insufficiency (chronic) (peripheral): Secondary | ICD-10-CM | POA: Insufficient documentation

## 2021-10-02 DIAGNOSIS — D2230 Melanocytic nevi of unspecified part of face: Secondary | ICD-10-CM | POA: Insufficient documentation

## 2021-10-02 NOTE — Assessment & Plan Note (Signed)
Pt's BP is normal today when checked by Claiborne Billings C (CMA) and myself (128/84).  Her home cuff is not accurate.  I suspect that her elevated BP yesterday was due to the stress and uncertainty caused by the vertigo and then when she realized it was elevated, she panicked even more- causing it to go higher.  I do not think it was the elevated BP causing her dizziness.  BP is normal today and she is asymptomatic.  Will continue the Olmesartan 1 tab daily rather than 1/2 tab and she is to get herself a new cuff.  Pt expressed understanding and is in agreement w/ plan.  ?

## 2021-10-02 NOTE — Progress Notes (Signed)
? ?Subjective:  ? ? Patient ID: Jacqueline [name], female    DOB: 1934-10-01, 87 y.o.   MRN: [id number] ? ?HPI ?HTN- pt went to ER yesterday due to elevated BP.  BP was as high as 178/98 (upon arrival) and decreased to 163/79 prior to d/c.  Work up was negative w/ exception of abnormal UA.  Culture is pending.  Pt reports she had 'horrible dizziness' upon waking yesterday.  She would raise her head and have to lie back down due to extreme dizziness.  On the 4th time, she was able to sit up.  Is taking Olmesartan '40mg'$  daily.  Is not taking Amlodipine.  Pt reports she had a HA 'all day yesterday'.  Today notes a tightness at the top of her head and on the L. ? ? ?Review of Systems ?For ROS see HPI  ? ?This visit occurred during the SARS-CoV-2 public health emergency.  Safety protocols were in place, including screening questions prior to the visit, additional usage of staff PPE, and extensive cleaning of exam room while observing appropriate contact time as indicated for disinfecting solutions.   ?   ?Objective:  ? Physical Exam ?Vitals reviewed.  ?Constitutional:   ?   General: She is not in acute distress. ?   Appearance: Normal appearance. She is well-developed. She is not ill-appearing.  ?HENT:  ?   Head: Normocephalic and atraumatic.  ?   Right Ear: Tympanic membrane normal.  ?   Left Ear: Tympanic membrane normal.  ?   Nose: Mucosal edema, congestion and rhinorrhea present.  ?   Right Sinus: No maxillary sinus tenderness or frontal sinus tenderness.  ?   Left Sinus: No maxillary sinus tenderness or frontal sinus tenderness.  ?   Mouth/Throat:  ?   Pharynx: Posterior oropharyngeal erythema (w/ PND) present.  ?Eyes:  ?   Conjunctiva/sclera: Conjunctivae normal.  ?   Pupils: Pupils are equal, round, and reactive to light.  ?Cardiovascular:  ?   Rate and Rhythm: Normal rate and regular rhythm.  ?   Pulses: Normal pulses.  ?   Heart sounds: Normal heart sounds.  ?Pulmonary:  ?   Effort: Pulmonary effort is normal.  No respiratory distress.  ?   Breath sounds: Normal breath sounds. No wheezing or rales.  ?Musculoskeletal:  ?   Cervical back: Normal range of motion and neck supple.  ?   Right lower leg: No edema.  ?   Left lower leg: No edema.  ?Lymphadenopathy:  ?   Cervical: No cervical adenopathy.  ?Skin: ?   General: Skin is warm and dry.  ?Neurological:  ?   General: No focal deficit present.  ?   Mental Status: She is alert and oriented to person, place, and time.  ?Psychiatric:     ?   Mood and Affect: Mood normal.     ?   Behavior: Behavior normal.     ?   Thought Content: Thought content normal.  ? ? ? ? ? ?   ?Assessment & Plan:  ? ?Vertigo- new.  Pt's description of awakening and becoming extremely dizzy when trying to lift her head is consistent w/ positional vertigo.  She is asymptomatic today and reports feeling much better.  We discussed that this can often be caused by sinus congestion- which she has right now due to seasonal allergies.  Encouraged her to restart her Flonase but decrease to every other day due to nose bleeds.  Reviewed supportive care and red  flags that should prompt return.  Pt expressed understanding and is in agreement w/ plan.  ?

## 2021-10-02 NOTE — Patient Instructions (Signed)
Follow up as needed or as scheduled ?Continue to take a whole tab of Olmesartan daily ?Drink LOTS of fluids ?Spot check your BP at home and if consistently higher than 140/90, let me know ?If your BP is running high and you want to make sure your cuff is accurate, we can schedule a nurse visit to make sure they correlate ?If you again have vertigo- let me know! ?Call with any questions or concerns ?Hang in there!!! ?

## 2021-10-03 LAB — URINE CULTURE: Culture: >=100000 — AB

## 2021-10-04 ENCOUNTER — Telehealth: Payer: Self-pay | Admitting: Family Medicine

## 2021-10-04 MED ORDER — MECLIZINE HCL 12.5 MG PO TABS
12.5000 mg | ORAL_TABLET | Freq: Three times a day (TID) | ORAL | 0 refills | Status: DC | PRN
Start: 2021-10-04 — End: 2022-07-17

## 2021-10-04 NOTE — Telephone Encounter (Signed)
Spoke to patient, she voiced understanding and confirmed she picked up the medication.  ?

## 2021-10-04 NOTE — Telephone Encounter (Signed)
Low dose meclizine was sent in for patient to use as needed for dizziness.  She should drink LOTS of fluids, make sure she is using her Flonase, and she should add a daily Claritin or Zyrtec ?

## 2021-10-04 NOTE — Telephone Encounter (Signed)
Pt called in stating that she is still having big waves of vertigo, she wanted to know if some medication could be sent into the [facility], she saw KT on Tuesday.  ?

## 2021-10-08 ENCOUNTER — Other Ambulatory Visit: Payer: Self-pay

## 2021-10-08 ENCOUNTER — Emergency Department (HOSPITAL_BASED_OUTPATIENT_CLINIC_OR_DEPARTMENT_OTHER): Payer: Medicare Other

## 2021-10-08 ENCOUNTER — Encounter (HOSPITAL_BASED_OUTPATIENT_CLINIC_OR_DEPARTMENT_OTHER): Payer: Self-pay | Admitting: Obstetrics and Gynecology

## 2021-10-08 ENCOUNTER — Emergency Department (HOSPITAL_BASED_OUTPATIENT_CLINIC_OR_DEPARTMENT_OTHER)
Admission: EM | Admit: 2021-10-08 | Discharge: 2021-10-08 | Disposition: A | Discharge status: Home / Self Care | Payer: Medicare Other | Attending: Emergency Medicine | Admitting: Emergency Medicine

## 2021-10-08 DIAGNOSIS — M542 Cervicalgia: Secondary | ICD-10-CM | POA: Diagnosis not present

## 2021-10-08 DIAGNOSIS — W01198A Fall on same level from slipping, tripping and stumbling with subsequent striking against other object, initial encounter: Secondary | ICD-10-CM | POA: Diagnosis not present

## 2021-10-08 DIAGNOSIS — Z79899 Other long term (current) drug therapy: Secondary | ICD-10-CM | POA: Diagnosis not present

## 2021-10-08 DIAGNOSIS — S0990XA Unspecified injury of head, initial encounter: Secondary | ICD-10-CM | POA: Insufficient documentation

## 2021-10-08 NOTE — Discharge Instructions (Signed)
Your scans today were reassuring, no signs of fractures or bleeding.  Please follow-up with your primary later this week if you have any other symptoms, return to the ED if you notice new pain, start vomiting, or have vision changes. ?

## 2021-10-08 NOTE — ED Notes (Signed)
RN provided AVS using Teachback Method. Patient verbalizes understanding of Discharge Instructions. Opportunity for Questioning and Answers were provided by RN. Patient Discharged from ED ambulatory to Home with Family. ? ?

## 2021-10-08 NOTE — ED Provider Notes (Signed)
?[facility] ?Provider Note ? ? ?CSN: [id number] ?Arrival date & time: 10/08/21  1439 ? ?  ? ?History ? ?Chief Complaint  ?Patient presents with  ? Fall  ? ? ?Jacqueline [name] is a 87 y.o. female. ? ?HPI ? ?Patient presents with head and neck pain.  She [place] the [place], her foot caught a stair and she fell backwards hitting the back of her head.  She did not lose consciousness, no nausea or vomiting.  Has not had any vision changes.  She has pain on the right side of her neck which is worse when she moves and has been improving throughout this ED stay. ? ?Home Medications ?Prior to Admission medications   ?Medication Sig Start Date End Date Taking? Authorizing Provider  ?acetaminophen (TYLENOL) 500 MG tablet Take 1,000 mg by mouth daily as needed for moderate pain or headache.    [provider]  ?atorvastatin (LIPITOR) 10 MG tablet TAKE 1 TABLET BY MOUTH  DAILY 04/17/21   Midge Minium, MD  ?cholecalciferol (VITAMIN D) 1000 units tablet Take 2,000 Units by mouth every evening.    [provider]  ?diclofenac Sodium (VOLTAREN) 1 % GEL  06/11/19   [provider]  ?Dorzolamide HCl-Timolol Mal PF 2-0.5 % SOLN Apply 1 drop to eye 2 (two) times daily. 08/29/21   [provider]  ?fluticasone (FLONASE) 50 MCG/ACT nasal spray Place 2 sprays into both nostrils daily. 08/13/21   Midge Minium, MD  ?latanoprost (XALATAN) 0.005 % ophthalmic solution SMARTSIG:In Eye(s) 06/28/20   [provider]  ?loperamide (IMODIUM) 2 MG capsule Take 3 mg by mouth as needed for diarrhea or loose stools.     [provider]  ?meclizine (ANTIVERT) 12.5 MG tablet Take 1 tablet (12.5 mg total) by mouth 3 (three) times daily as needed for dizziness. 10/04/21   Midge Minium, MD  ?mirtazapine (REMERON) 15 MG tablet TAKE 1 TABLET BY MOUTH  DAILY AT BEDTIME 01/10/20   Midge Minium, MD  ?olmesartan (BENICAR) 40 MG tablet TAKE 1 TABLET BY  MOUTH  DAILY 11/30/20   Midge Minium, MD  ?omeprazole (PRILOSEC OTC) 20 MG tablet Take 1 tablet (20 mg total) by mouth daily. 06/15/12   Dorena Cookey, MD  ?potassium chloride (KLOR-CON) 10 MEQ tablet TAKE 1 TABLET BY MOUTH EVERY DAY 09/14/21   Midge Minium, MD  ?QUEtiapine (SEROQUEL) 25 MG tablet TAKE 1 TABLET BY MOUTH  DAILY AT BEDTIME 10/01/21   Midge Minium, MD  ?tretinoin (RETIN-A) 9.833 % cream 1 application in the evening to face 07/05/20   [provider]  ?   ? ?Allergies    ?Aspirin, Codeine sulfate, Hydrocodone-acetaminophen, and Lactose intolerance (gi)   ? ?Review of Systems   ?Review of Systems ? ?Physical Exam ?Updated Vital Signs ?BP (!) 181/86   Pulse 76   Temp 98.1 ?F (36.7 ?C)   Resp 16   Ht '5\' 3"'$  (1.6 m)   Wt 71.2 kg   SpO2 98%   BMI 27.81 kg/m?  ?Physical Exam ?Vitals and nursing note reviewed. Exam conducted with a chaperone present.  ?Constitutional:   ?   Appearance: Normal appearance.  ?HENT:  ?   Head: Normocephalic.  ?   Nose: Nose normal.  ?Eyes:  ?   General: No scleral icterus.    ?   Right eye: No discharge.     ?   Left eye: No discharge.  ?  Extraocular Movements: Extraocular movements intact.  ?   Pupils: Pupils are equal, round, and reactive to light.  ?Cardiovascular:  ?   Rate and Rhythm: Normal rate and regular rhythm.  ?   Pulses: Normal pulses.  ?   Heart sounds: Normal heart sounds. No murmur heard. ?  No friction rub. No gallop.  ?Pulmonary:  ?   Effort: Pulmonary effort is normal. No respiratory distress.  ?   Breath sounds: Normal breath sounds.  ?Abdominal:  ?   General: Abdomen is flat. Bowel sounds are normal. There is no distension.  ?   Palpations: Abdomen is soft.  ?   Tenderness: There is no abdominal tenderness.  ?Skin: ?   General: Skin is warm and dry.  ?   Coloration: Skin is not jaundiced.  ?Neurological:  ?   Mental Status: She is alert. Mental status is at baseline.  ?   Coordination: Coordination normal.  ? ? ?ED Results /  Procedures / Treatments   ?Labs ?(all labs ordered are listed, but only abnormal results are displayed) ?Labs Reviewed - No data to display ? ?EKG ?None ? ?Radiology ?CT Head Wo Contrast ? ?Result Date: 10/08/2021 ?CLINICAL DATA:  Head trauma, minor (Age >= 65y), fall today with head injury on cement, lightheadedness, neck tenderness EXAM: CT HEAD WITHOUT CONTRAST TECHNIQUE: Contiguous axial images were obtained from the base of the skull through the vertex without intravenous contrast. RADIATION DOSE REDUCTION: This exam was performed according to the departmental dose-optimization program which includes automated exposure control, adjustment of the mA and/or kV according to patient size and/or use of iterative reconstruction technique. COMPARISON:  05/04/2015 head CT FINDINGS: Brain: Generalized mild cerebral volume loss. No evidence of parenchymal hemorrhage or extra-axial fluid collection. No mass lesion, mass effect, or midline shift. No CT evidence of acute infarction. Nonspecific mild to moderate subcortical and periventricular white matter hypodensity, most in keeping with chronic small vessel ischemic change. No ventriculomegaly. Vascular: No acute abnormality. Skull: No evidence of calvarial fracture. Mild osteitis frontalis interna, unchanged. Mild superolateral left parietal scalp contusion. Sinuses/Orbits: The visualized paranasal sinuses are essentially clear. Other:  The mastoid air cells are unopacified. IMPRESSION: 1. Mild superolateral left parietal scalp contusion. 2. No evidence of acute intracranial abnormality. No evidence of calvarial fracture. 3. Generalized mild cerebral volume loss and mild-to-moderate chronic small vessel ischemic changes in the cerebral white matter. Electronically Signed   By: Ilona Sorrel M.D.   On: 10/08/2021 15:50  ? ?CT Cervical Spine Wo Contrast ? ?Result Date: 10/08/2021 ?CLINICAL DATA:  Neck trauma (Age >= 65y), fall today with head injury, neck tenderness and  lightheadedness EXAM: CT CERVICAL SPINE WITHOUT CONTRAST TECHNIQUE: Multidetector CT imaging of the cervical spine was performed without intravenous contrast. Multiplanar CT image reconstructions were also generated. RADIATION DOSE REDUCTION: This exam was performed according to the departmental dose-optimization program which includes automated exposure control, adjustment of the mA and/or kV according to patient size and/or use of iterative reconstruction technique. COMPARISON:  None. FINDINGS: Alignment: Straightening of the cervical spine. No facet subluxation. Dens is well positioned between the lateral masses of C1. Skull base and vertebrae: No acute fracture. No primary bone lesion or focal pathologic process. Soft tissues and spinal canal: No prevertebral edema. No visible canal hematoma. Disc levels: Moderate multilevel cervical degenerative disc disease, most prominent at C5-6. Advanced bilateral facet arthropathy. Mild degenerative foraminal stenosis on the right at C5-6. Upper chest: No acute abnormality. Other: Diffuse osteopenia. Visualized mastoid air  cells appear clear. No pathologically enlarged cervical lymph nodes. Subcentimeter hypodense bilateral thyroid nodules. No follow-up imaging is recommended. Reference: J Am Coll Radiol. 2015 Feb;12(2): 143-50 IMPRESSION: 1. No evidence of acute fracture or subluxation in the cervical spine. 2. Diffuse osteopenia. 3. Moderate multilevel cervical degenerative changes as described. Electronically Signed   By: Ilona Sorrel M.D.   On: 10/08/2021 15:55   ? ?Procedures ?Procedures  ? ? ?Medications Ordered in ED ?Medications - No data to display ? ?ED Course/ Medical Decision Making/ A&P ?  ?                        ?Medical Decision Making ? ?This patient presents to the ED for concern of head injury, this involves an extensive number of treatment options, and is a complaint that carries with it a high risk of complications and morbidity.  The differential  diagnosis includes but not limited to: intracranial bleed, c spine injury, basilar skull fracture ? ?PE - patient is neurologically in tact, no focal deficits.  ? ?Additional history obtained:  ? ?Independent historian: h

## 2021-10-08 NOTE — ED Triage Notes (Signed)
Patient reports to the ER for head and neck pain following a fall. Patient reports she had a slip and fall and hit the back of her head hard on the cement. Patient reports an ache in the back of her neck and some lightheadedness. Patient reports she has a hx of vertigo.  ?

## 2021-10-15 ENCOUNTER — Ambulatory Visit: Payer: Medicare Other | Admitting: Registered Nurse

## 2021-10-16 ENCOUNTER — Ambulatory Visit (INDEPENDENT_AMBULATORY_CARE_PROVIDER_SITE_OTHER): Payer: Medicare Other | Admitting: Family Medicine

## 2021-10-16 ENCOUNTER — Encounter: Payer: Self-pay | Admitting: Family Medicine

## 2021-10-16 VITALS — BP 138/78 | HR 75 | Temp 97.8°F | Resp 15 | Ht 63.0 in | Wt 160.2 lb

## 2021-10-16 DIAGNOSIS — W19XXXD Unspecified fall, subsequent encounter: Secondary | ICD-10-CM

## 2021-10-16 DIAGNOSIS — S0990XD Unspecified injury of head, subsequent encounter: Secondary | ICD-10-CM

## 2021-10-16 NOTE — Patient Instructions (Signed)
Follow up as needed or as scheduled ?Thankfully you look great today!!! ?Continue to ease back into normal activities as pain allows ?Call with any questions or concerns ?Stay Safe!!!! ?

## 2021-10-16 NOTE — Progress Notes (Signed)
? ?  Subjective:  ? ? Patient ID: Jacqueline [name], female    DOB: [birth year], 87 y.o.   MRN: [id number] ? ?HPI ?ER f/u- pt fell on 4/10 while walking in a store.  She fell backwards and hit her head.  No LOC, N/V.  No visual changes.  R sided neck pain.  Thankfully pt was neurologically intact.  CT head and Cspine WNL.  Pt had no dizziness preceding her fall.  She got her foot caught when turning around. ? ?Pt reports that with time, R sided head and neck pain are improving.  Now able to turn head more easily.  Pt reports hitting her head did not worsen her vertigo- this has improved.  HAs have improved.  Has residual scalp tenderness but this is improving. ? ? ?Review of Systems ?For ROS see HPI  ?   ?Objective:  ? Physical Exam ?Vitals reviewed.  ?Constitutional:   ?   General: She is not in acute distress. ?   Appearance: Normal appearance. She is not ill-appearing.  ?HENT:  ?   Head: Normocephalic and atraumatic.  ?Eyes:  ?   Extraocular Movements: Extraocular movements intact.  ?   Conjunctiva/sclera: Conjunctivae normal.  ?   Pupils: Pupils are equal, round, and reactive to light.  ?Musculoskeletal:  ?   Cervical back: Normal range of motion and neck supple. No rigidity or tenderness.  ?Skin: ?   General: Skin is warm and dry.  ?Neurological:  ?   General: No focal deficit present.  ?   Mental Status: She is alert and oriented to person, place, and time.  ?   Cranial Nerves: No cranial nerve deficit.  ?   Motor: No weakness.  ?   Coordination: Coordination normal.  ?   Gait: Gait normal.  ?Psychiatric:     ?   Mood and Affect: Mood normal.     ?   Behavior: Behavior normal.  ? ? ? ? ? ?   ?Assessment & Plan:  ?Fall from standing w/ closed head injury- new.  Thankfully the patient seems to have escaped what could have been serious injury from hitting the back of her head.  Her imaging in the ER was negative.  Her pain continues to improve.  Her neck ROM is back to baseline.  No obvious bruising and no  worsening vertigo since hitting her head.  No additional work up needed at this time. ? ?

## 2021-11-01 ENCOUNTER — Other Ambulatory Visit: Payer: Self-pay | Admitting: Family Medicine

## 2021-12-15 ENCOUNTER — Other Ambulatory Visit: Payer: Self-pay | Admitting: Family Medicine

## 2021-12-18 ENCOUNTER — Other Ambulatory Visit: Payer: Self-pay | Admitting: Family Medicine

## 2022-01-08 ENCOUNTER — Ambulatory Visit: Payer: Medicare Other | Admitting: Family Medicine

## 2022-01-14 ENCOUNTER — Ambulatory Visit: Payer: Medicare Other | Admitting: Family Medicine

## 2022-01-14 ENCOUNTER — Encounter: Payer: Self-pay | Admitting: Family Medicine

## 2022-01-14 VITALS — BP 120/70 | HR 70 | Temp 97.3°F | Resp 16 | Ht 63.0 in | Wt 156.2 lb

## 2022-01-14 DIAGNOSIS — E785 Hyperlipidemia, unspecified: Secondary | ICD-10-CM | POA: Diagnosis not present

## 2022-01-14 DIAGNOSIS — E663 Overweight: Secondary | ICD-10-CM

## 2022-01-14 DIAGNOSIS — I10 Essential (primary) hypertension: Secondary | ICD-10-CM | POA: Diagnosis not present

## 2022-01-14 LAB — CBC WITH DIFFERENTIAL/PLATELET
Basophils Absolute: 0 10*3/uL (ref 0.0–0.1)
Basophils Relative: 0.7 % (ref 0.0–3.0)
Eosinophils Absolute: 0.1 10*3/uL (ref 0.0–0.7)
Eosinophils Relative: 1.8 % (ref 0.0–5.0)
HCT: 41.4 % (ref 36.0–46.0)
Hemoglobin: 13.7 g/dL (ref 12.0–15.0)
Lymphocytes Relative: 31.2 % (ref 12.0–46.0)
Lymphs Abs: 1.9 10*3/uL (ref 0.7–4.0)
MCHC: 33 g/dL (ref 30.0–36.0)
MCV: 92.9 fl (ref 78.0–100.0)
Monocytes Absolute: 0.5 10*3/uL (ref 0.1–1.0)
Monocytes Relative: 8.6 % (ref 3.0–12.0)
Neutro Abs: 3.6 10*3/uL (ref 1.4–7.7)
Neutrophils Relative %: 57.7 % (ref 43.0–77.0)
Platelets: 150 10*3/uL (ref 150.0–400.0)
RBC: 4.45 Mil/uL (ref 3.87–5.11)
RDW: 12.7 % (ref 11.5–15.5)
WBC: 6.2 10*3/uL (ref 4.0–10.5)

## 2022-01-14 LAB — BASIC METABOLIC PANEL
BUN: 21 mg/dL (ref 6–23)
CO2: 25 mEq/L (ref 19–32)
Calcium: 9.4 mg/dL (ref 8.4–10.5)
Chloride: 110 mEq/L (ref 96–112)
Creatinine, Ser: 1.05 mg/dL (ref 0.40–1.20)
GFR: 48.6 mL/min — ABNORMAL LOW (ref >60.00)
Glucose, Bld: 91 mg/dL (ref 70–99)
Potassium: 4.7 mEq/L (ref 3.5–5.1)
Sodium: 142 mEq/L (ref 135–145)

## 2022-01-14 LAB — HEPATIC FUNCTION PANEL
ALT: 18 U/L (ref 0–35)
AST: 17 U/L (ref 0–37)
Albumin: 4.1 g/dL (ref 3.5–5.2)
Alkaline Phosphatase: 104 U/L (ref 39–117)
Bilirubin, Direct: 0.1 mg/dL (ref 0.0–0.3)
Total Bilirubin: 0.6 mg/dL (ref 0.2–1.2)
Total Protein: 7.2 g/dL (ref 6.0–8.3)

## 2022-01-14 LAB — LIPID PANEL
Cholesterol: 138 mg/dL (ref 0–200)
HDL: 37.6 mg/dL — ABNORMAL LOW (ref >39.00)
LDL Cholesterol: 67 mg/dL (ref 0–99)
NonHDL: 100.02
Total CHOL/HDL Ratio: 4
Triglycerides: 167 mg/dL — ABNORMAL HIGH (ref 0.0–149.0)
VLDL: 33.4 mg/dL (ref 0.0–40.0)

## 2022-01-14 LAB — TSH: TSH: 1.6 u[IU]/mL (ref 0.35–5.50)

## 2022-01-14 NOTE — Progress Notes (Signed)
   Subjective:    Patient ID: Jacqueline [name], female    DOB: [birth year], 87 y.o.   MRN: [id number]  HPI HTN- chronic problem, on Olmesartan '40mg'$  w/ good control.  Denies CP, SOB, HAs, visual changes, edema.  Pt reports feeling 'good most of the time'  Hyperlipidemia- chronic problem, on Lipitor '10mg'$  w/o difficulty.  Denies abd pain, N/V.  Overweight- pt is down 4 lbs since last visit.  She is very pleased with the weight loss- has cut back on sugar, has stopped eating after dinner in the evening.   Review of Systems For ROS see HPI     Objective:   Physical Exam Vitals reviewed.  Constitutional:      General: She is not in acute distress.    Appearance: Normal appearance. She is well-developed. She is not ill-appearing.  HENT:     Head: Normocephalic and atraumatic.  Eyes:     Conjunctiva/sclera: Conjunctivae normal.     Pupils: Pupils are equal, round, and reactive to light.  Neck:     Thyroid: No thyromegaly.  Cardiovascular:     Rate and Rhythm: Normal rate and regular rhythm.     Pulses: Normal pulses.     Heart sounds: Normal heart sounds. No murmur heard. Pulmonary:     Effort: Pulmonary effort is normal. No respiratory distress.     Breath sounds: Normal breath sounds.  Abdominal:     General: There is no distension.     Palpations: Abdomen is soft.     Tenderness: There is no abdominal tenderness.  Musculoskeletal:     Cervical back: Normal range of motion and neck supple.     Right lower leg: No edema.     Left lower leg: No edema.  Lymphadenopathy:     Cervical: No cervical adenopathy.  Skin:    General: Skin is warm and dry.  Neurological:     General: No focal deficit present.     Mental Status: She is alert and oriented to person, place, and time.  Psychiatric:        Mood and Affect: Mood normal.        Behavior: Behavior normal.           Assessment & Plan:

## 2022-01-14 NOTE — Assessment & Plan Note (Signed)
Chronic problem.  Tolerating Lipitor '10mg'$  daily w/o difficulty.  Check labs.  Adjust meds prn

## 2022-01-14 NOTE — Assessment & Plan Note (Signed)
Pt is down 4 lbs since last visit.  She has changed her eating habits to improve her stomach issues which has resulted in weight loss.  She is pleased w/ the results.  Will continue to follow.

## 2022-01-14 NOTE — Patient Instructions (Signed)
Schedule your complete physical in 6 months We'll notify you of your lab results and make any changes if needed Keep up the good work on healthy diet and regular physical activity- you look great!! Call with any questions or concerns Stay Safe!  Stay Healthy! Have a great summer!!!

## 2022-01-14 NOTE — Assessment & Plan Note (Signed)
Chronic problem.  Excellent control on Olmesartan '40mg'$  daily.  Will check labs due to ARB but no anticipated med changes.  Will follow.

## 2022-01-15 ENCOUNTER — Telehealth: Payer: Self-pay

## 2022-01-15 NOTE — Telephone Encounter (Signed)
Informed pt of lab results  

## 2022-01-15 NOTE — Telephone Encounter (Signed)
-----   Message from Midge Minium, MD sent at 01/15/2022  7:15 AM EDT ----- Labs look great!  No changes at this time

## 2022-01-17 ENCOUNTER — Telehealth: Payer: Self-pay

## 2022-01-17 NOTE — Telephone Encounter (Signed)
Called patient x 3 with no answer left Vm to return call , may schedule next available appointment.  L.Wilson,LPN

## 2022-01-18 ENCOUNTER — Other Ambulatory Visit: Payer: Self-pay | Admitting: Family Medicine

## 2022-01-18 ENCOUNTER — Encounter: Payer: Self-pay | Admitting: Family Medicine

## 2022-01-23 ENCOUNTER — Ambulatory Visit: Payer: Medicare Other | Admitting: Family Medicine

## 2022-01-23 ENCOUNTER — Encounter: Payer: Self-pay | Admitting: Family Medicine

## 2022-01-23 VITALS — BP 118/60 | HR 74 | Temp 97.5°F | Resp 18 | Ht 63.0 in | Wt 158.6 lb

## 2022-01-23 DIAGNOSIS — R202 Paresthesia of skin: Secondary | ICD-10-CM

## 2022-01-23 DIAGNOSIS — R200 Anesthesia of skin: Secondary | ICD-10-CM | POA: Diagnosis not present

## 2022-01-23 LAB — IBC + FERRITIN
Ferritin: 77.6 ng/mL (ref 10.0–291.0)
Iron: 99 ug/dL (ref 42–145)
Saturation Ratios: 28.7 % (ref 20.0–50.0)
TIBC: 344.4 ug/dL (ref 250.0–450.0)
Transferrin: 246 mg/dL (ref 212.0–360.0)

## 2022-01-23 LAB — B12 AND FOLATE PANEL
Folate: 21.6 ng/mL (ref >5.9)
Vitamin B-12: >1500 pg/mL — ABNORMAL HIGH (ref 211–911)

## 2022-01-23 LAB — HEMOGLOBIN A1C: Hgb A1c MFr Bld: 6 % (ref 4.6–6.5)

## 2022-01-23 NOTE — Patient Instructions (Signed)
Follow up as needed or as scheduled We'll notify you of your lab results and make any changes if needed We'll call you to schedule your neurology appt Continue to be careful when up and about!  We want to prevent falls! Call with any questions or concerns Hang in there!!!

## 2022-01-23 NOTE — Progress Notes (Signed)
   Subjective:    Patient ID: Jacqueline [name], female    DOB: Aug 06, 1934, 87 y.o.   MRN: [id number]  HPI Numbness of feet- pt reports sxs have been present for 2-3 yrs.  Notices sxs have been worsening over the past 6 months.  Both feet.  Pt reports she has to be more cautious w/ her balance due to foot numbness.  L great toe is most bothersome.  Numbness is localized to plantar surfaces.  Pt did not have chemo.  Pt denies burning or tingling- just difficulty feeling feet.  No current lumbar pain.  Pt is currently taking B12   Review of Systems For ROS see HPI     Objective:   Physical Exam Vitals reviewed.  Constitutional:      General: She is not in acute distress.    Appearance: Normal appearance. She is not ill-appearing.  HENT:     Head: Normocephalic and atraumatic.  Cardiovascular:     Pulses: Normal pulses.  Musculoskeletal:     Right lower leg: No edema.     Left lower leg: No edema.  Skin:    General: Skin is warm and dry.  Neurological:     Mental Status: She is alert and oriented to person, place, and time.     Cranial Nerves: No cranial nerve deficit.     Sensory: Sensory deficit (decreased sensation of feet bilaterally on plantar surface) present.     Motor: No weakness.     Gait: Gait normal.           Assessment & Plan:  Numbness of both feet- new to provider, deteriorated for pt.  She reports sxs x2-3 yrs but these have been worsening over the last 6 months.  Fears she will fall due to decreased sensation.  Denies burning or pain.  B/c she is not having pain, will hold off on Gabapentin/Lyrica at this time.  Will get B12/folate, A1C, and iron panel to assess.  Labs done earlier this month WNL.  Will refer to Neuro for a complete evaluation.  Pt expressed understanding and is in agreement w/ plan.

## 2022-01-24 NOTE — Progress Notes (Signed)
Pt seen results via my chart  

## 2022-02-05 ENCOUNTER — Ambulatory Visit: Payer: Medicare Other | Admitting: Diagnostic Neuroimaging

## 2022-02-05 ENCOUNTER — Encounter: Payer: Self-pay | Admitting: Diagnostic Neuroimaging

## 2022-02-05 VITALS — BP 155/87 | HR 72 | Ht 63.0 in | Wt 157.6 lb

## 2022-02-05 DIAGNOSIS — G629 Polyneuropathy, unspecified: Secondary | ICD-10-CM | POA: Diagnosis not present

## 2022-02-05 NOTE — Patient Instructions (Addendum)
NUMBNESS / mild pain IN FEET  - check neuropathy labs  - consider plantar fasciitis evaluation (sports med or podiatry); try calf / achilles stretching, foot insole support  - consider capsaicin cream, lidocaine patch / cream, alpha-lipoic acid '600mg'$  daily, nervive tabs

## 2022-02-05 NOTE — Progress Notes (Signed)
[facility]  PATIENT: Jacqueline [name] DOB: 08-27-1934  REFERRING CLINICIAN: Midge Minium, MD HISTORY FROM: patient  REASON FOR VISIT: new consult    HISTORICAL  CHIEF COMPLAINT:  Chief Complaint  Patient presents with   Numbness/tingling in feet    Rm 6 New Pt "within past 3-4 weeks I am having some pain with numbness, tingling which has been going on 2 years; have bulging disk in my back and get occas injections for that"    HISTORY OF PRESENT ILLNESS:   87 year old female here for evaluation of numbness and tingling in the toes and feet.  Symptoms started about 2 years ago.  Gradual onset progressive in the toes and bottom of feet.  Sometimes feels uncomfortable.  No specific time correlation.  Symptoms are mild overall.   REVIEW OF SYSTEMS: Full 14 system review of systems performed and negative with exception of: as per HPI.  ALLERGIES: Allergies  Allergen Reactions   Codeine Nausea And Vomiting   Aspirin Nausea Only and Nausea And Vomiting    stomach upset   Codeine Sulfate Nausea Only    stomach upset   Hydrocodone-Acetaminophen Nausea And Vomiting   Lactose Intolerance (Gi) Nausea And Vomiting    Stomach upset     HOME MEDICATIONS: Outpatient Medications Prior to Visit  Medication Sig Dispense Refill   acetaminophen (TYLENOL) 500 MG tablet Take 1,000 mg by mouth daily as needed for moderate pain or headache.     atorvastatin (LIPITOR) 10 MG tablet TAKE 1 TABLET BY MOUTH  DAILY 90 tablet 3   cholecalciferol (VITAMIN D) 1000 units tablet Take 2,000 Units by mouth every evening.     diclofenac Sodium (VOLTAREN) 1 % GEL      Dorzolamide HCl-Timolol Mal PF 2-0.5 % SOLN Apply 1 drop to eye 2 (two) times daily.     fluticasone (FLONASE) 50 MCG/ACT nasal spray Place 2 sprays into both nostrils daily. 16 g 6   latanoprost (XALATAN) 0.005 % ophthalmic solution SMARTSIG:In Eye(s)     loperamide (IMODIUM) 2 MG capsule Take 3 mg by mouth as  needed for diarrhea or loose stools.      meclizine (ANTIVERT) 12.5 MG tablet Take 1 tablet (12.5 mg total) by mouth 3 (three) times daily as needed for dizziness. 30 tablet 0   mirtazapine (REMERON) 15 MG tablet TAKE 1 TABLET BY MOUTH  DAILY AT BEDTIME 90 tablet 3   olmesartan (BENICAR) 40 MG tablet TAKE 1 TABLET BY MOUTH  DAILY 90 tablet 3   omeprazole (PRILOSEC OTC) 20 MG tablet Take 1 tablet (20 mg total) by mouth daily. 90 tablet 4   potassium chloride (KLOR-CON) 10 MEQ tablet TAKE 1 TABLET BY MOUTH EVERY DAY 90 tablet 0   QUEtiapine (SEROQUEL) 25 MG tablet TAKE 1 TABLET BY MOUTH  DAILY AT BEDTIME 90 tablet 3   tretinoin (RETIN-A) 8.119 % cream 1 application in the evening to face     No facility-administered medications prior to visit.    PAST MEDICAL HISTORY: Past Medical History:  Diagnosis Date   Arthritis    "was probably in my knees" (09/04/2017)   Bilateral breast cancer ([place]) 2007, 2012   breast- bilateral   Breast cancer ([place]) hx 2007   stage T1b grade 2 ER/positive,ductal ca left breast   Family history of adverse reaction to anesthesia    "granddaughters get PONV, wild, combative" (09/04/2017)   Family history of breast cancer    Family history of pancreatic cancer  GERD (gastroesophageal reflux disease)    Glaucoma, both eyes    History of kidney stones    Hyperlipidemia    Hypertension    Migraine    "none in the 2000s" (09/04/2017)   Osteopenia    Personal history of chemotherapy    Personal history of radiation therapy    PONV (postoperative nausea and vomiting)    Right rotator cuff tear 08/2012   Ruptured disc, cervical    Skin cancer    "cut off face" (09/04/2017)   Sleep initiation dysfunction     PAST SURGICAL HISTORY: Past Surgical History:  Procedure Laterality Date   BREAST BIOPSY Bilateral    BREAST LUMPECTOMY WITH RADIOACTIVE SEED LOCALIZATION Right 06/14/2016   Procedure: RIGHT BREAST LUMPECTOMY WITH RADIOACTIVE SEED LOCALIZATION;  Surgeon:  Autumn Messing III, MD;  Location: [facility];  Service: General;  Laterality: Right;   CATARACT EXTRACTION, BILATERAL Bilateral    CHOLECYSTECTOMY OPEN     COLONOSCOPY W/ BIOPSIES AND POLYPECTOMY     DILATION AND CURETTAGE OF UTERUS     GLAUCOMA SURGERY Bilateral    "lasered"   INCISION AND DRAINAGE BREAST ABSCESS Left 05/2006   Developed in the mammosite cavity and treated with I&D, packed and healed    [place]   "opened me up"   MASTECTOMY COMPLETE / [place] BIOPSY Bilateral 09/04/2017   MASTECTOMY PARTIAL / LUMPECTOMY Left 2007   Dr Margot Chimes   MASTECTOMY PARTIAL / LUMPECTOMY  2012 and 2017   Right Dr Margot Chimes 2012, Dr. Marlou Starks 2017   MASTECTOMY W/ [place] Bilateral 09/04/2017   Procedure: BILATERAL MASTECTOMIES WITH SENTINEL LYMPH NODE BIOPSY;  Surgeon: Jovita Kussmaul, MD;  Location: [facility];  Service: General;  Laterality: Bilateral;   PORTACATH PLACEMENT     "it's been removed" (09/04/2017)   REPLACEMENT TOTAL KNEE BILATERAL  ~ 2007`- ~ 2012   left-right   SKIN CANCER EXCISION     "face"   TONSILLECTOMY     WISDOM TOOTH EXTRACTION      FAMILY HISTORY: Family History  Problem Relation Age of Onset   Hypertension Mother    Colon polyps Mother    Stroke Father    Cancer Brother        throat   Stroke Brother    Hyperlipidemia Sister    Hypertension Sister    Congenital heart disease Brother    Pancreatic cancer Other        dx in her 67s   Breast cancer Cousin        paternal cousin died in her 16s   Diabetes Paternal Uncle    Colon cancer Neg Hx    Stomach cancer Neg Hx     SOCIAL HISTORY: Social History   Socioeconomic History   Marital status: Married    Spouse name: Barnabas Lister   Number of children: 2   Years of education: Not on file   Highest education level: Bachelor's degree (e.g., BA, AB, BS)  Occupational History   Not on file  Tobacco Use   Smoking status: Never   Smokeless  tobacco: Never  Vaping Use   Vaping Use: Never used  Substance and Sexual Activity   Alcohol use: No    Alcohol/week: 0.0 standard drinks of alcohol   Drug use: No   Sexual activity: Not on file  Other Topics Concern   Not on file  Social History Narrative  Lives with husband   Social Determinants of Health   Financial Resource Strain: Low Risk  (01/08/2021)   Overall Financial Resource Strain (CARDIA)    Difficulty of Paying Living Expenses: Not very hard  Food Insecurity: No Food Insecurity (01/08/2021)   Hunger Vital Sign    Worried About Running Out of Food in the Last Year: Never true    Ran Out of Food in the Last Year: Never true  Transportation Needs: No Transportation Needs (01/08/2021)   PRAPARE - Hydrologist (Medical): No    Lack of Transportation (Non-Medical): No  Physical Activity: Insufficiently Active (01/08/2021)   Exercise Vital Sign    Days of Exercise per Week: 4 days    Minutes of Exercise per Session: 30 min  Stress: No Stress Concern Present (01/08/2021)   [place]    Feeling of Stress : Not at all  Social Connections: Moderately Integrated (01/08/2021)   Social Connection and Isolation Panel [NHANES]    Frequency of Communication with Friends and Family: More than three times a week    Frequency of Social Gatherings with Friends and Family: More than three times a week    Attends Religious Services: More than 4 times per year    Active Member of [employer] or Organizations: No    Attends Archivist Meetings: Never    Marital Status: Married  Human resources officer Violence: Not At Risk (01/08/2021)   Humiliation, Afraid, Rape, and Kick questionnaire    Fear of Current or Ex-Partner: No    Emotionally Abused: No    Physically Abused: No    Sexually Abused: No     PHYSICAL EXAM  GENERAL EXAM/CONSTITUTIONAL: Vitals:  Vitals:   02/05/22 0921  BP: (!)  155/87  Pulse: 72  Weight: 157 lb 9.6 oz (71.5 kg)  Height: '5\' 3"'$  (1.6 m)   Body mass index is 27.92 kg/m. Wt Readings from Last 3 Encounters:  02/05/22 157 lb 9.6 oz (71.5 kg)  01/23/22 158 lb 9.6 oz (71.9 kg)  01/14/22 156 lb 4 oz (70.9 kg)   Patient is in no distress; well developed, nourished and groomed; neck is supple  CARDIOVASCULAR: Examination of carotid arteries is normal; no carotid bruits Regular rate and rhythm, no murmurs Examination of peripheral vascular system by observation and palpation is normal  EYES: Ophthalmoscopic exam of optic discs and posterior segments is normal; no papilledema or hemorrhages No results found.  MUSCULOSKELETAL: Gait, strength, tone, movements noted in Neurologic exam below  NEUROLOGIC: MENTAL STATUS:     06/25/2018    9:36 AM 05/28/2017   11:27 AM  MMSE - Mini Mental State Exam  Orientation to time 5 5  Orientation to Place 5 5  Registration 3 3  Attention/ Calculation 5 5  Recall 3 2  Language- name 2 objects 2 2  Language- repeat 1 1  Language- follow 3 step command 3 3  Language- read & follow direction 1 1  Write a sentence 1 1  Copy design 1 1  Total score 30 29   awake, alert, oriented to person, place and time recent and remote memory intact normal attention and concentration language fluent, comprehension intact, naming intact fund of knowledge appropriate  CRANIAL NERVE:  2nd - no papilledema on fundoscopic exam 2nd, 3rd, 4th, 6th - pupils equal and reactive to light, visual fields full to confrontation, extraocular muscles intact, no nystagmus 5th - facial sensation symmetric  7th - facial strength symmetric 8th - hearing intact 9th - palate elevates symmetrically, uvula midline 11th - shoulder shrug symmetric 12th - tongue protrusion midline  MOTOR:  normal bulk and tone, full strength in the BUE, BLE  SENSORY:  normal and symmetric to light touch, pinprick, temperature, vibration; EXCEPT SLIGHT  DECR ON BOTTOM OF FEET  COORDINATION:  finger-nose-finger, fine finger movements normal  REFLEXES:  deep tendon reflexes TRACE and symmetric  GAIT/STATION:  narrow based gait     DIAGNOSTIC DATA (LABS, IMAGING, TESTING) - I reviewed patient records, labs, notes, testing and imaging myself where available.  Lab Results  Component Value Date   WBC 6.2 01/14/2022   HGB 13.7 01/14/2022   HCT 41.4 01/14/2022   MCV 92.9 01/14/2022   PLT 150.0 01/14/2022      Component Value Date/Time   NA 142 01/14/2022 1051   NA 143 03/13/2012 1029   K 4.7 01/14/2022 1051   K 3.8 03/13/2012 1029   CL 110 01/14/2022 1051   CL 110 (H) 03/13/2012 1029   CO2 25 01/14/2022 1051   CO2 25 03/13/2012 1029   GLUCOSE 91 01/14/2022 1051   GLUCOSE 100 (H) 03/13/2012 1029   BUN 21 01/14/2022 1051   BUN 16.0 03/13/2012 1029   CREATININE 1.05 01/14/2022 1051   CREATININE 1.1 03/13/2012 1029   CALCIUM 9.4 01/14/2022 1051   CALCIUM 8.8 03/13/2012 1029   PROT 7.2 01/14/2022 1051   PROT 6.7 03/13/2012 1029   ALBUMIN 4.1 01/14/2022 1051   ALBUMIN 3.4 (L) 03/13/2012 1029   AST 17 01/14/2022 1051   AST 17 03/13/2012 1029   ALT 18 01/14/2022 1051   ALT 19 03/13/2012 1029   ALKPHOS 104 01/14/2022 1051   ALKPHOS 109 03/13/2012 1029   BILITOT 0.6 01/14/2022 1051   BILITOT 0.30 03/13/2012 1029   GFRNONAA 58 (L) 10/01/2021 1315   GFRAA >60 08/27/2017 0945   Lab Results  Component Value Date   CHOL 138 01/14/2022   HDL 37.60 (L) 01/14/2022   LDLCALC 67 01/14/2022   LDLDIRECT 86.0 12/28/2019   TRIG 167.0 (H) 01/14/2022   CHOLHDL 4 01/14/2022   Lab Results  Component Value Date   HGBA1C 6.0 01/23/2022   Lab Results  Component Value Date   VITAMINB12 >1500 (H) 01/23/2022   Lab Results  Component Value Date   TSH 1.60 01/14/2022        ASSESSMENT AND PLAN  87 y.o. year old female here with:   Dx:  1. Neuropathy      PLAN:  NUMBNESS IN FEET (distal neuropathy suspected;  likely age-related / idiopathic) - check neuropathy labs - consider plantar fasciitis evaluation (sports med or podiatry); try calf stretching, foot insole support  Painful neuropathy treatment options: - may consider duloxetine 30-'60mg'$  daily, amitriptyline 25-'50mg'$  at bedtime, gabapentin 100-'300mg'$  three times a day, pregabalin 75-'150mg'$  twice a day - capsaicin cream, lidocaine patch / cream, alpha-lipoic acid '600mg'$  daily, nervive tabs  Orders Placed This Encounter  Procedures   SPEP with IFE   ANA w/Reflex   SSA, SSB   Vitamin B6   Vitamin B1   ANCA Profile   Return for pending test results, pending if symptoms worsen or fail to improve.    Penni Bombard, MD 09/05/8586, 50:27 AM Certified in Neurology, Neurophysiology and Neuroimaging  [facility] [address] [place], [place] [address] [phone]

## 2022-02-09 LAB — MULTIPLE MYELOMA PANEL, SERUM
Albumin SerPl Elph-Mcnc: 3.6 g/dL (ref 2.9–4.4)
Albumin/Glob SerPl: 1.2 (ref 0.7–1.7)
Alpha 1: 0.2 g/dL (ref 0.0–0.4)
Alpha2 Glob SerPl Elph-Mcnc: 0.7 g/dL (ref 0.4–1.0)
B-Globulin SerPl Elph-Mcnc: 0.9 g/dL (ref 0.7–1.3)
Gamma Glob SerPl Elph-Mcnc: 1.4 g/dL (ref 0.4–1.8)
Globulin, Total: 3.2 g/dL (ref 2.2–3.9)
IgA/Immunoglobulin A, Serum: 280 mg/dL (ref 64–422)
IgG (Immunoglobin G), Serum: 1260 mg/dL (ref 586–1602)
IgM (Immunoglobulin M), Srm: 174 mg/dL (ref 26–217)
Total Protein: 6.8 g/dL (ref 6.0–8.5)

## 2022-02-09 LAB — ANCA PROFILE
Anti-MPO Antibodies: <0.2 units (ref 0.0–0.9)
Anti-PR3 Antibodies: <0.2 units (ref 0.0–0.9)
Atypical pANCA: <1:20 {titer}
C-ANCA: <1:20 {titer}
P-ANCA: <1:20 {titer}

## 2022-02-09 LAB — SJOGREN'S SYNDROME ANTIBODS(SSA + SSB)
ENA SSA (RO) Ab: <0.2 AI (ref 0.0–0.9)
ENA SSB (LA) Ab: <0.2 AI (ref 0.0–0.9)

## 2022-02-09 LAB — ANA W/REFLEX: ANA Titer 1: NEGATIVE

## 2022-02-09 LAB — VITAMIN B6: Vitamin B6: 5.6 ug/L (ref 3.4–65.2)

## 2022-02-09 LAB — VITAMIN B1: Thiamine: 116.5 nmol/L (ref 66.5–200.0)

## 2022-02-15 ENCOUNTER — Ambulatory Visit: Payer: Medicare Other | Admitting: Diagnostic Neuroimaging

## 2022-03-13 ENCOUNTER — Other Ambulatory Visit: Payer: Self-pay | Admitting: Family Medicine

## 2022-03-14 ENCOUNTER — Encounter: Payer: Self-pay | Admitting: Family Medicine

## 2022-03-14 ENCOUNTER — Ambulatory Visit: Payer: Medicare Other | Admitting: Family Medicine

## 2022-03-14 VITALS — BP 124/70 | HR 89 | Temp 97.6°F | Resp 16 | Ht 63.0 in | Wt 155.6 lb

## 2022-03-14 DIAGNOSIS — Z23 Encounter for immunization: Secondary | ICD-10-CM | POA: Diagnosis not present

## 2022-03-14 DIAGNOSIS — I10 Essential (primary) hypertension: Secondary | ICD-10-CM | POA: Diagnosis not present

## 2022-03-14 DIAGNOSIS — J040 Acute laryngitis: Secondary | ICD-10-CM | POA: Diagnosis not present

## 2022-03-14 MED ORDER — AZELASTINE HCL 0.1 % NA SOLN: 2.0000 | Freq: Two times a day (BID) | NASAL | 12 refills | Status: AC

## 2022-03-14 NOTE — Progress Notes (Signed)
   Subjective:    Patient ID: Jacqueline [name], female    DOB: [birth year], 87 y.o.   MRN: [id number]  HPI HTN- chronic problem.  Pt was having low back pain last week and BP readings were elevated.  Is taking Olmesartan '40mg'$  daily and when BP readings were high, she added a 1/2 tab in the evening until readings improved.  BP has been normal since Sunday.  No CP, SOB.  HAs have improved.  Laryngitis- 'all the time'.  Waking up coughing at night.  Sxs started 2-3 weeks ago.  Pt can feel drainage in back of throat and 'tongue feels coated'.  Using Flonase daily which provided relief in the past but is not currently effective.  Also taking OTC allergy pill daily.   Review of Systems For ROS see HPI     Objective:   Physical Exam Vitals reviewed.  Constitutional:      General: She is not in acute distress.    Appearance: Normal appearance. She is well-developed. She is not ill-appearing.  HENT:     Head: Normocephalic and atraumatic.     Right Ear: Tympanic membrane normal.     Left Ear: Tympanic membrane normal.     Nose: Mucosal edema and congestion present. No rhinorrhea.     Right Sinus: No maxillary sinus tenderness or frontal sinus tenderness.     Left Sinus: No maxillary sinus tenderness or frontal sinus tenderness.     Mouth/Throat:     Pharynx: Posterior oropharyngeal erythema (w/ PND) present.  Eyes:     Conjunctiva/sclera: Conjunctivae normal.     Pupils: Pupils are equal, round, and reactive to light.  Cardiovascular:     Rate and Rhythm: Normal rate and regular rhythm.     Heart sounds: Normal heart sounds.  Pulmonary:     Effort: Pulmonary effort is normal. No respiratory distress.     Breath sounds: Normal breath sounds. No wheezing or rales.  Musculoskeletal:     Cervical back: Normal range of motion and neck supple.  Lymphadenopathy:     Cervical: No cervical adenopathy.  Skin:    General: Skin is warm and dry.  Neurological:     General: No focal deficit  present.     Mental Status: She is alert and oriented to person, place, and time.  Psychiatric:        Mood and Affect: Mood normal.        Behavior: Behavior normal.        Thought Content: Thought content normal.          Assessment & Plan:  Laryngitis- worsening.  Pt has had issues in the past but reports sxs are now constant. She reports copious PND.  Taking daily antihistamine and using Flonase.  Will add Astepro to improve drainage.  If sxs don't improve, will refer to ENT for complete evaluation.  Pt expressed understanding and is in agreement w/ plan.

## 2022-03-14 NOTE — Patient Instructions (Signed)
Follow up as needed or as scheduled Continue to take the daily allergy pill Use the Flonase daily ADD the Azelastine 2 sprays twice daily Drink LOTS of water If not better in the next week or so, let me know so we can refer to ENT Call with any questions or concerns Hang in there!!!

## 2022-03-14 NOTE — Assessment & Plan Note (Signed)
Chronic problem.  Today's BP WNL.  Currently asymptomatic.  HAs have resolved and the higher readings she had last week have normalized as pain improved.  No med changes at this time.  Will continue to follow.

## 2022-03-18 NOTE — Telephone Encounter (Signed)
-----   Message from Penni Bombard, MD sent at 03/17/2022  3:27 PM EDT ----- Normal labs. Please call patient. -VRP

## 2022-03-19 ENCOUNTER — Ambulatory Visit: Payer: Medicare Other | Admitting: Diagnostic Neuroimaging

## 2022-04-08 ENCOUNTER — Telehealth: Payer: Self-pay | Admitting: Family Medicine

## 2022-04-08 NOTE — Telephone Encounter (Signed)
Caller name: Reid Nawrot   On DPR? :yes/no: Yes  Call back number: [phone]  Provider they see: Birdie Riddle   Reason for call:Pt was seen by Dr.Tabori on 03/14/22. Marland Kitchen Pt stated that she has been using her Flonase nose spray twice daily as Dr.Tabori told her to but its not helping her laryngitis. Pt stating that Dr.Tabori told her if she isn't feeling better then pt needs to see ENT doctor.  Pt want to know if Dr.Tabori can refer her to a ENT doctor.

## 2022-04-08 NOTE — Telephone Encounter (Signed)
Please place ENT referral for chronic laryngitis

## 2022-04-08 NOTE — Telephone Encounter (Signed)
Is it ok to place an ENT referral ?

## 2022-04-09 ENCOUNTER — Other Ambulatory Visit: Payer: Self-pay

## 2022-04-09 DIAGNOSIS — J040 Acute laryngitis: Secondary | ICD-10-CM

## 2022-04-09 NOTE — Telephone Encounter (Signed)
Referral to ENT has been placed 

## 2022-06-04 ENCOUNTER — Ambulatory Visit: Payer: Medicare Other | Admitting: Family Medicine

## 2022-06-04 ENCOUNTER — Encounter: Payer: Self-pay | Admitting: Family Medicine

## 2022-06-04 VITALS — BP 138/78 | HR 78 | Temp 97.9°F | Resp 18 | Ht 63.0 in | Wt 160.1 lb

## 2022-06-04 DIAGNOSIS — I10 Essential (primary) hypertension: Secondary | ICD-10-CM | POA: Diagnosis not present

## 2022-06-04 MED ORDER — METOPROLOL SUCCINATE ER 25 MG PO TB24: 25.0000 mg | ORAL_TABLET | Freq: Every day | ORAL | 3 refills | Status: DC

## 2022-06-04 NOTE — Progress Notes (Signed)
   Subjective:    Patient ID: Jacqueline [name], female    DOB: 1935/02/17, 87 y.o.   MRN: [id number]  HPI HTN- chronic problem, on Olmesartan '40mg'$  daily.  Pt reports BP has been spiking.  At home BP's have been as high as 150s.  The other night she was restless and not able to sleep and at that time BP was 170.  She added an extra Olmesartan at bed time.  Yesterday BP's ranged 124-156/78-95.  Pt reports head feels 'tight and heavy' and she feels lightheaded.  Denies palpitations.  No CP, SOB.  'i just don't feel calm'.  Review of Systems For ROS see HPI     Objective:   Physical Exam Vitals reviewed.  Constitutional:      General: She is not in acute distress.    Appearance: Normal appearance. She is well-developed. She is not ill-appearing.  HENT:     Head: Normocephalic and atraumatic.  Eyes:     Conjunctiva/sclera: Conjunctivae normal.     Pupils: Pupils are equal, round, and reactive to light.  Neck:     Thyroid: No thyromegaly.  Cardiovascular:     Rate and Rhythm: Normal rate and regular rhythm.     Heart sounds: Normal heart sounds. No murmur heard. Pulmonary:     Effort: Pulmonary effort is normal. No respiratory distress.     Breath sounds: Normal breath sounds.  Abdominal:     General: There is no distension.     Palpations: Abdomen is soft.     Tenderness: There is no abdominal tenderness.  Musculoskeletal:     Cervical back: Normal range of motion and neck supple.  Lymphadenopathy:     Cervical: No cervical adenopathy.  Skin:    General: Skin is warm and dry.  Neurological:     Mental Status: She is alert and oriented to person, place, and time.  Psychiatric:        Behavior: Behavior normal.           Assessment & Plan:

## 2022-06-04 NOTE — Assessment & Plan Note (Signed)
Deteriorated.  Pt reports home BP has been labile- ranging 124-170.  She reports her head feels 'tight and heavy' and will feel lightheaded at times.  She states she feels 'worked up' and 'i just don't feel calm'.  She has been taking an extra 1/2 tab of Olmesartan if BP was elevated.  Rather than that, will add Metoprolol XL '25mg'$  daily in hopes of decreasing BP and also helping w/ her restlessness/anxiety.  She has a f/u scheduled next month but she is to f/u sooner if sxs don't improve.  Pt understands and is in agreement.

## 2022-06-04 NOTE — Patient Instructions (Signed)
Follow up as needed or as scheduled CONTINUE the Olmesartan once daily in the morning ADD Metoprolol once daily Continue to drink lots of water Change positions slowly to allow yourself time to adjust Call with any questions or concerns Stay Safe!  Stay Healthy! Happy Holidays!!!

## 2022-06-10 ENCOUNTER — Other Ambulatory Visit: Payer: Self-pay | Admitting: Family Medicine

## 2022-07-17 ENCOUNTER — Ambulatory Visit (INDEPENDENT_AMBULATORY_CARE_PROVIDER_SITE_OTHER): Payer: Medicare Other | Admitting: Family Medicine

## 2022-07-17 ENCOUNTER — Encounter: Payer: Self-pay | Admitting: Family Medicine

## 2022-07-17 VITALS — BP 130/84 | HR 80 | Temp 97.4°F | Ht 62.0 in | Wt 163.6 lb

## 2022-07-17 DIAGNOSIS — I10 Essential (primary) hypertension: Secondary | ICD-10-CM

## 2022-07-17 DIAGNOSIS — E559 Vitamin D deficiency, unspecified: Secondary | ICD-10-CM | POA: Diagnosis not present

## 2022-07-17 DIAGNOSIS — Z0000 Encounter for general adult medical examination without abnormal findings: Secondary | ICD-10-CM | POA: Diagnosis not present

## 2022-07-17 LAB — LIPID PANEL
Cholesterol: 147 mg/dL (ref 0–200)
HDL: 44.7 mg/dL (ref >39.00)
LDL Cholesterol: 76 mg/dL (ref 0–99)
NonHDL: 102.47
Total CHOL/HDL Ratio: 3
Triglycerides: 134 mg/dL (ref 0.0–149.0)
VLDL: 26.8 mg/dL (ref 0.0–40.0)

## 2022-07-17 LAB — CBC WITH DIFFERENTIAL/PLATELET
Basophils Absolute: 0 10*3/uL (ref 0.0–0.1)
Basophils Relative: 0.5 % (ref 0.0–3.0)
Eosinophils Absolute: 0.1 10*3/uL (ref 0.0–0.7)
Eosinophils Relative: 1.4 % (ref 0.0–5.0)
HCT: 40.4 % (ref 36.0–46.0)
Hemoglobin: 13.8 g/dL (ref 12.0–15.0)
Lymphocytes Relative: 26.4 % (ref 12.0–46.0)
Lymphs Abs: 2.2 10*3/uL (ref 0.7–4.0)
MCHC: 34.1 g/dL (ref 30.0–36.0)
MCV: 91.8 fl (ref 78.0–100.0)
Monocytes Absolute: 0.8 10*3/uL (ref 0.1–1.0)
Monocytes Relative: 9.5 % (ref 3.0–12.0)
Neutro Abs: 5.2 10*3/uL (ref 1.4–7.7)
Neutrophils Relative %: 62.2 % (ref 43.0–77.0)
Platelets: 153 10*3/uL (ref 150.0–400.0)
RBC: 4.4 Mil/uL (ref 3.87–5.11)
RDW: 12.4 % (ref 11.5–15.5)
WBC: 8.4 10*3/uL (ref 4.0–10.5)

## 2022-07-17 LAB — HEPATIC FUNCTION PANEL
ALT: 19 U/L (ref 0–35)
AST: 16 U/L (ref 0–37)
Albumin: 4.1 g/dL (ref 3.5–5.2)
Alkaline Phosphatase: 105 U/L (ref 39–117)
Bilirubin, Direct: 0.1 mg/dL (ref 0.0–0.3)
Total Bilirubin: 0.5 mg/dL (ref 0.2–1.2)
Total Protein: 7.2 g/dL (ref 6.0–8.3)

## 2022-07-17 LAB — BASIC METABOLIC PANEL
BUN: 17 mg/dL (ref 6–23)
CO2: 30 mEq/L (ref 19–32)
Calcium: 9.3 mg/dL (ref 8.4–10.5)
Chloride: 107 mEq/L (ref 96–112)
Creatinine, Ser: 1.11 mg/dL (ref 0.40–1.20)
GFR: 45.3 mL/min — ABNORMAL LOW (ref >60.00)
Glucose, Bld: 97 mg/dL (ref 70–99)
Potassium: 4.4 mEq/L (ref 3.5–5.1)
Sodium: 140 mEq/L (ref 135–145)

## 2022-07-17 LAB — TSH: TSH: 1.92 u[IU]/mL (ref 0.35–5.50)

## 2022-07-17 LAB — VITAMIN D 25 HYDROXY (VIT D DEFICIENCY, FRACTURES): VITD: 37.86 ng/mL (ref 30.00–100.00)

## 2022-07-17 MED ORDER — AMLODIPINE BESYLATE 5 MG PO TABS: 5.0000 mg | ORAL_TABLET | Freq: Every day | ORAL | 3 refills | Status: DC

## 2022-07-17 NOTE — Patient Instructions (Addendum)
Follow up in 3-4 weeks to recheck BP We'll notify you of your lab results and make any changes if needed STOP the Metoprolol START the Amlodipine once daily Keep up the good work!  You look great!! Get the RSV vaccine at your convenience Call with any questions or concerns Stay Safe!  Stay Healthy! Happy New Year!!!

## 2022-07-17 NOTE — Assessment & Plan Note (Signed)
Pt reports metoprolol caused excessive fatigue and increased hunger.  Even when she decreased to 1/2 tab the fatigue was overwhelming.  Will stop Metoprolol and start Amlodipine '5mg'$  daily.  Pt expressed understanding and is in agreement w/ plan.

## 2022-07-17 NOTE — Progress Notes (Signed)
   Subjective:    Patient ID: Jacqueline [name], female    DOB: 1935/03/24, 88 y.o.   MRN: [id number]  HPI CPE- UTD on PNA, flu.  Patient Care Team    Relationship Specialty Notifications Start End  Midge Minium, MD PCP - General Family Medicine  11/28/15   Luberta Mutter, MD Consulting Physician Ophthalmology  05/17/16   Kristeen Miss, MD Consulting Physician Neurosurgery  02/26/17   Elsie Saas, MD Consulting Physician Orthopedic Surgery  05/28/17   Jari Pigg, MD Consulting Physician Dermatology  05/28/17   Jovita Kussmaul, MD Consulting Physician General Surgery  12/10/17   Nicholas Lose, MD Consulting Physician Hematology and Oncology  12/10/17   Gardenia Phlegm, NP Nurse Practitioner Hematology and Oncology  12/10/17     Health Maintenance  Topic Date Due   Medicare Annual Wellness (AWV)  01/08/2022   DTaP/Tdap/Td (3 - Td or Tdap) 06/08/2022   COVID-19 Vaccine (7 - 2023-24 season) 08/02/2022 (Originally 06/09/2022)   Pneumonia Vaccine 32+ Years old  Completed   INFLUENZA VACCINE  Completed   DEXA SCAN  Completed   Zoster Vaccines- Shingrix  Completed   HPV VACCINES  Aged Out   COLONOSCOPY (Pts 45-94yr Insurance coverage will need to be confirmed)  Discontinued      Review of Systems Patient reports no vision/ hearing changes, adenopathy,fever, weight change,  persistant/recurrent hoarseness , swallowing issues, chest pain, palpitations, edema, persistant/recurrent cough, hemoptysis, dyspnea (rest/exertional/paroxysmal nocturnal), gastrointestinal bleeding (melena, rectal bleeding), abdominal pain, significant heartburn, bowel changes, GU symptoms (dysuria, hematuria, incontinence), Gyn symptoms (abnormal  bleeding, pain),  syncope, focal weakness, memory loss, skin/hair/nail changes, abnormal bruising or bleeding, anxiety, or depression.   + neuropathy feet    Objective:   Physical Exam General Appearance:    Alert, cooperative, no distress, appears  stated age  Head:    Normocephalic, without obvious abnormality, atraumatic  Eyes:    PERRL, conjunctiva/corneas clear, EOM's intact both eyes  Ears:    Normal TM's and external ear canals, both ears  Nose:   Nares normal, septum midline, mucosa normal, no drainage    or sinus tenderness  Throat:   Lips, mucosa, and tongue normal; teeth and gums normal  Neck:   Supple, symmetrical, trachea midline, no adenopathy;    Thyroid: no enlargement/tenderness/nodules  Back:     Symmetric, no curvature, ROM normal, no CVA tenderness  Lungs:     Clear to auscultation bilaterally, respirations unlabored  Chest Wall:    No tenderness or deformity   Heart:    Regular rate and rhythm, S1 and S2 normal, no murmur, rub   or gallop  Breast Exam:    Deferred  Abdomen:     Soft, non-tender, bowel sounds active all four quadrants,    no masses, no organomegaly  Genitalia:    Deferred  Rectal:    Extremities:   Extremities normal, atraumatic, no cyanosis or edema  Pulses:   2+ and symmetric all extremities  Skin:   Skin color, texture, turgor normal, no rashes or lesions  Lymph nodes:   Cervical, supraclavicular, and axillary nodes normal  Neurologic:   CNII-XII intact, normal strength, sensation and reflexes    throughout          Assessment & Plan:

## 2022-07-17 NOTE — Assessment & Plan Note (Signed)
Pt's PE WNL and unchanged from previous.  UTD on PNA, flu.  No longer having colonoscopy or mammo.  Check labs.  Anticipatory guidance provided.

## 2022-07-17 NOTE — Assessment & Plan Note (Signed)
Check labs and replete prn. 

## 2022-07-18 ENCOUNTER — Telehealth: Payer: Self-pay

## 2022-07-18 NOTE — Telephone Encounter (Signed)
-----  Message from Midge Minium, MD sent at 07/18/2022  7:34 AM EST ----- Labs look great!  No changes at this time

## 2022-07-18 NOTE — Telephone Encounter (Signed)
Informed pt of lab results  

## 2022-08-07 ENCOUNTER — Encounter: Payer: Self-pay | Admitting: Family Medicine

## 2022-08-07 ENCOUNTER — Ambulatory Visit: Payer: Medicare Other | Admitting: Family Medicine

## 2022-08-07 VITALS — BP 116/78 | HR 69 | Temp 98.2°F | Resp 17 | Ht 62.0 in | Wt 160.2 lb

## 2022-08-07 DIAGNOSIS — I10 Essential (primary) hypertension: Secondary | ICD-10-CM | POA: Diagnosis not present

## 2022-08-07 DIAGNOSIS — R441 Visual hallucinations: Secondary | ICD-10-CM

## 2022-08-07 LAB — POCT URINALYSIS DIPSTICK
Glucose, UA: NEGATIVE
Ketones, UA: POSITIVE
Leukocytes, UA: NEGATIVE
Nitrite, UA: POSITIVE
Protein, UA: NEGATIVE
Spec Grav, UA: 1.02 (ref 1.010–1.025)
Urobilinogen, UA: 0.2 E.U./dL
pH, UA: 7 (ref 5.0–8.0)

## 2022-08-07 NOTE — Progress Notes (Signed)
   Subjective:    Patient ID: Jacqueline [name], female    DOB: 14-Dec-1934, 88 y.o.   MRN: [id number]  HPI HTN- chronic problem.  At last visit she was complaining of fatigue from Metoprolol.  We switched off Metoprolol and started Amlodipine.  Pt didn't like how Amlodipine made her feel bc BP was dropping low.  Stopped taking.  Currently on Olmesartan 30m daily w/ excellent control.  Home BP's have been 120s-130s/70s.  Pt feels that her elevated BP's are due to anxiety, 'i feel like I need to calm down'.    Hallucinations- pt is having visual hallucinations at night.  Is seeing people in her room.  Pt reports she saw the faces for about a week and then they have disappeared again.  Pt is currently on Seroquel 258mand Remeron 1569mightly and has been for years to treat migraines.     Review of Systems For ROS see HPI     Objective:   Physical Exam Vitals reviewed.  Constitutional:      General: She is not in acute distress.    Appearance: Normal appearance. She is well-developed. She is not ill-appearing.  HENT:     Head: Normocephalic and atraumatic.  Eyes:     Conjunctiva/sclera: Conjunctivae normal.     Pupils: Pupils are equal, round, and reactive to light.  Neck:     Thyroid: No thyromegaly.  Cardiovascular:     Rate and Rhythm: Normal rate and regular rhythm.     Heart sounds: Normal heart sounds. No murmur heard. Pulmonary:     Effort: Pulmonary effort is normal. No respiratory distress.     Breath sounds: Normal breath sounds.  Abdominal:     General: There is no distension.     Palpations: Abdomen is soft.     Tenderness: There is no abdominal tenderness.  Musculoskeletal:     Cervical back: Normal range of motion and neck supple.     Right lower leg: No edema.     Left lower leg: No edema.  Lymphadenopathy:     Cervical: No cervical adenopathy.  Skin:    General: Skin is warm and dry.  Neurological:     Mental Status: She is alert and oriented to person,  place, and time. Mental status is at baseline.  Psychiatric:        Mood and Affect: Mood normal.        Behavior: Behavior normal.        Thought Content: Thought content normal.           Assessment & Plan:  Visual hallucinations- new.  Pt doesn't seem concerned by this but I am.  Will get UA and culture to r/o infxn as possible cause of new visual hallucinations.  She admits to increased anxiety so will increase Seroquel to [id number] to help w/ both mood and hallucinations.  Will treat any infxn if present.  Pt expressed understanding and is in agreement w/ plan.

## 2022-08-07 NOTE — Patient Instructions (Signed)
Follow up in 3 months to recheck BP Send me a MyChart message in 2 weeks to let me know how the increased dose of Seroquel is working INCREASE your Seroquel at night from '25mg'$  (1 tab) to '50mg'$  (2 tabs) Your blood pressure looks great!  No changes at this time Continue to drink lots of water Call with any questions or concerns Stay Safe!  Stay Healthy! Hang in there!!!

## 2022-08-10 LAB — URINE CULTURE
MICRO NUMBER:: 14533073
SPECIMEN QUALITY:: ADEQUATE

## 2022-08-12 ENCOUNTER — Encounter: Payer: Self-pay | Admitting: Family Medicine

## 2022-08-12 ENCOUNTER — Telehealth: Payer: Self-pay

## 2022-08-12 ENCOUNTER — Other Ambulatory Visit: Payer: Self-pay

## 2022-08-12 DIAGNOSIS — N390 Urinary tract infection, site not specified: Secondary | ICD-10-CM

## 2022-08-12 MED ORDER — CEPHALEXIN 500 MG PO CAPS: 500.0000 mg | ORAL_CAPSULE | Freq: Two times a day (BID) | ORAL | 0 refills | Status: AC

## 2022-08-12 NOTE — Telephone Encounter (Signed)
Left pt a VM to call office in regards to urine results .Keflex  500 mg sent to pharmacy

## 2022-08-12 NOTE — Telephone Encounter (Signed)
Left results on pt VM

## 2022-08-12 NOTE — Telephone Encounter (Signed)
-----   Message from Midge Minium, MD sent at 08/12/2022  7:40 AM EST ----- Your urine shows that you do have a urinary tract infection- which could be the cause of the hallucinations you've been seeing.  We will treat w/ Keflex '500mg'$  twice daily (#10, no refills)

## 2022-08-13 NOTE — Telephone Encounter (Signed)
Spoke with patient, and advised

## 2022-08-18 NOTE — Assessment & Plan Note (Signed)
Chronic problem.  BP is well controlled on Olmesartan 37m daily.  She previously had fatigue on Metoprolol and 'didn't like' how Amlodipine made her feel.  Since BP is well controlled, no need to start additional medication at this time.  Will follow.

## 2022-08-19 ENCOUNTER — Telehealth: Payer: Self-pay

## 2022-08-19 NOTE — Telephone Encounter (Signed)
Faxed and in scan

## 2022-08-19 NOTE — Telephone Encounter (Signed)
Dr Birdie Riddle signed forms faxed back and placed in scan

## 2022-08-19 NOTE — Telephone Encounter (Signed)
[employer] sent a form to be signed . Placed in Dr Birdie Riddle to be signed folder

## 2022-08-19 NOTE — Telephone Encounter (Signed)
Form signed and returned to [facility] 

## 2022-09-04 ENCOUNTER — Other Ambulatory Visit: Payer: Self-pay | Admitting: Family Medicine

## 2022-09-10 ENCOUNTER — Other Ambulatory Visit: Payer: Self-pay | Admitting: Family Medicine

## 2022-09-27 ENCOUNTER — Other Ambulatory Visit: Payer: Self-pay | Admitting: Family Medicine

## 2022-10-23 ENCOUNTER — Other Ambulatory Visit: Payer: Self-pay | Admitting: Family Medicine

## 2022-11-05 ENCOUNTER — Encounter: Payer: Self-pay | Admitting: Family Medicine

## 2022-11-05 ENCOUNTER — Ambulatory Visit: Payer: Medicare Other | Admitting: Family Medicine

## 2022-11-05 VITALS — BP 128/76 | HR 70 | Temp 98.8°F | Resp 18 | Ht 62.0 in | Wt 159.0 lb

## 2022-11-05 DIAGNOSIS — E785 Hyperlipidemia, unspecified: Secondary | ICD-10-CM

## 2022-11-05 DIAGNOSIS — E663 Overweight: Secondary | ICD-10-CM | POA: Diagnosis not present

## 2022-11-05 DIAGNOSIS — I10 Essential (primary) hypertension: Secondary | ICD-10-CM

## 2022-11-05 LAB — CBC WITH DIFFERENTIAL/PLATELET
Basophils Absolute: 0 10*3/uL (ref 0.0–0.1)
Basophils Relative: 0.8 % (ref 0.0–3.0)
Eosinophils Absolute: 0.1 10*3/uL (ref 0.0–0.7)
Eosinophils Relative: 1.1 % (ref 0.0–5.0)
HCT: 39.9 % (ref 36.0–46.0)
Hemoglobin: 13.4 g/dL (ref 12.0–15.0)
Lymphocytes Relative: 32.1 % (ref 12.0–46.0)
Lymphs Abs: 2 10*3/uL (ref 0.7–4.0)
MCHC: 33.6 g/dL (ref 30.0–36.0)
MCV: 91.4 fl (ref 78.0–100.0)
Monocytes Absolute: 0.5 10*3/uL (ref 0.1–1.0)
Monocytes Relative: 8.4 % (ref 3.0–12.0)
Neutro Abs: 3.6 10*3/uL (ref 1.4–7.7)
Neutrophils Relative %: 57.6 % (ref 43.0–77.0)
Platelets: 156 10*3/uL (ref 150.0–400.0)
RBC: 4.36 Mil/uL (ref 3.87–5.11)
RDW: 13.3 % (ref 11.5–15.5)
WBC: 6.2 10*3/uL (ref 4.0–10.5)

## 2022-11-05 LAB — BASIC METABOLIC PANEL
BUN: 19 mg/dL (ref 6–23)
CO2: 27 mEq/L (ref 19–32)
Calcium: 9.4 mg/dL (ref 8.4–10.5)
Chloride: 107 mEq/L (ref 96–112)
Creatinine, Ser: 1.02 mg/dL (ref 0.40–1.20)
GFR: 50.04 mL/min — ABNORMAL LOW (ref >60.00)
Glucose, Bld: 80 mg/dL (ref 70–99)
Potassium: 4.6 mEq/L (ref 3.5–5.1)
Sodium: 141 mEq/L (ref 135–145)

## 2022-11-05 LAB — LIPID PANEL
Cholesterol: 131 mg/dL (ref 0–200)
HDL: 36.7 mg/dL — ABNORMAL LOW (ref >39.00)
LDL Cholesterol: 64 mg/dL (ref 0–99)
NonHDL: 94.78
Total CHOL/HDL Ratio: 4
Triglycerides: 154 mg/dL — ABNORMAL HIGH (ref 0.0–149.0)
VLDL: 30.8 mg/dL (ref 0.0–40.0)

## 2022-11-05 LAB — HEPATIC FUNCTION PANEL
ALT: 26 U/L (ref 0–35)
AST: 23 U/L (ref 0–37)
Albumin: 3.9 g/dL (ref 3.5–5.2)
Alkaline Phosphatase: 97 U/L (ref 39–117)
Bilirubin, Direct: 0.1 mg/dL (ref 0.0–0.3)
Total Bilirubin: 0.5 mg/dL (ref 0.2–1.2)
Total Protein: 7 g/dL (ref 6.0–8.3)

## 2022-11-05 LAB — TSH: TSH: 2.04 u[IU]/mL (ref 0.35–5.50)

## 2022-11-05 NOTE — Assessment & Plan Note (Signed)
Chronic problem.  Currently on Lipitor 10mg daily w/o difficulty.  Check labs.  Adjust meds prn  

## 2022-11-05 NOTE — Assessment & Plan Note (Signed)
Chronic problem.  Currently on Olmesartan 40mg  daily w/ good control.  Asymptomatic.  Check labs due to ARB but no anticipated med changes.

## 2022-11-05 NOTE — Progress Notes (Signed)
   Subjective:    Patient ID: Jacqueline [name], female    DOB: 1935-06-06, 88 y.o.   MRN: [id number]  HPI HTN- chronic problem, on Olmesartan 40mg  daily w/ good control.  Pt reports feeling 'really good'.  No CP, SOB, HA's, visual changes, edema.  Hyperlipidemia- chronic problem, on Lipitor 10mg  daily.  No abd pain, N/V.  Overweight- weight and BMI are stable at 159 and 29.08 respectively.  Pt is doing some walking now that weather is nice.  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   Review of Systems For ROS see HPI     Objective:   Physical Exam Vitals reviewed.  Constitutional:      General: She is not in acute distress.    Appearance: Normal appearance. She is well-developed. She is not ill-appearing.  HENT:     Head: Normocephalic and atraumatic.  Eyes:     Conjunctiva/sclera: Conjunctivae normal.     Pupils: Pupils are equal, round, and reactive to light.  Neck:     Thyroid: No thyromegaly.  Cardiovascular:     Rate and Rhythm: Normal rate and regular rhythm.     Pulses: Normal pulses.     Heart sounds: Normal heart sounds. No murmur heard. Pulmonary:     Effort: Pulmonary effort is normal. No respiratory distress.     Breath sounds: Normal breath sounds.  Abdominal:     General: There is no distension.     Palpations: Abdomen is soft.     Tenderness: There is no abdominal tenderness.  Musculoskeletal:     Cervical back: Normal range of motion and neck supple.     Right lower leg: No edema.     Left lower leg: No edema.  Lymphadenopathy:     Cervical: No cervical adenopathy.  Skin:    General: Skin is warm and dry.  Neurological:     Mental Status: She is alert and oriented to person, place, and time.  Psychiatric:        Behavior: Behavior normal.           Assessment & Plan:

## 2022-11-05 NOTE — Patient Instructions (Signed)
Schedule your complete physical in January We'll notify you of your lab results and make any changes if needed Keep up the good work on healthy diet and regular physical activity Call with any questions or concerns Happy Early [place] Ouch!!!

## 2022-11-05 NOTE — Assessment & Plan Note (Signed)
Weight and BMI are stable.  Not following a particular diet.  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  Will continue to follow.

## 2022-11-06 ENCOUNTER — Telehealth: Payer: Self-pay

## 2022-11-06 NOTE — Telephone Encounter (Signed)
Pt aware of lab results 

## 2022-11-06 NOTE — Telephone Encounter (Signed)
-----   Message from Sheliah Hatch, MD sent at 11/06/2022  7:28 AM EDT ----- Labs look great!  No changes at this time

## 2022-11-28 ENCOUNTER — Other Ambulatory Visit: Payer: Self-pay | Admitting: Family Medicine

## 2022-12-16 ENCOUNTER — Other Ambulatory Visit: Payer: Self-pay | Admitting: Family Medicine

## 2022-12-16 NOTE — Telephone Encounter (Signed)
Pt called to confirm refill request was received from her mail order pharmacy, confirmed with patient. Refill sent

## 2022-12-25 ENCOUNTER — Other Ambulatory Visit: Payer: Self-pay | Admitting: Family Medicine

## 2023-03-04 ENCOUNTER — Other Ambulatory Visit: Payer: Self-pay | Admitting: Family Medicine

## 2023-04-02 IMAGING — CT CT HEAD W/O CM
4 series · 16 of 47 positions shown, 18 images · non-contrast
Comparison: 05/04/2015 head CT

CLINICAL DATA: Head trauma, minor (Age >= 65y), fall today with
head injury on cement, lightheadedness, neck tenderness



[Series 2: head wo · axial · 0.42mm/px · z∈[+1154,+1274]mm · 7 of 32 slices shown, 9 images]
[im 4/32  brain]
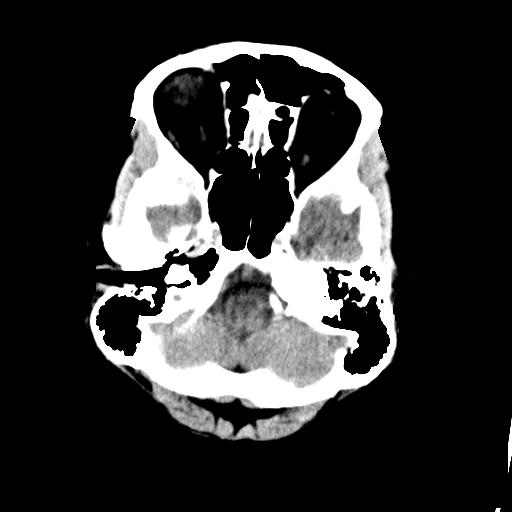
[im 4/32  bone]
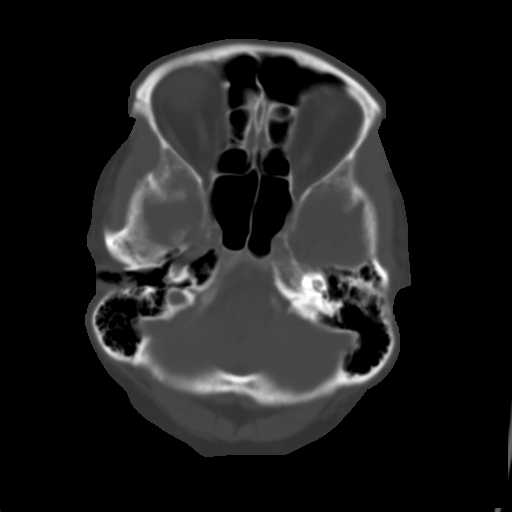
[im 8/32  brain]
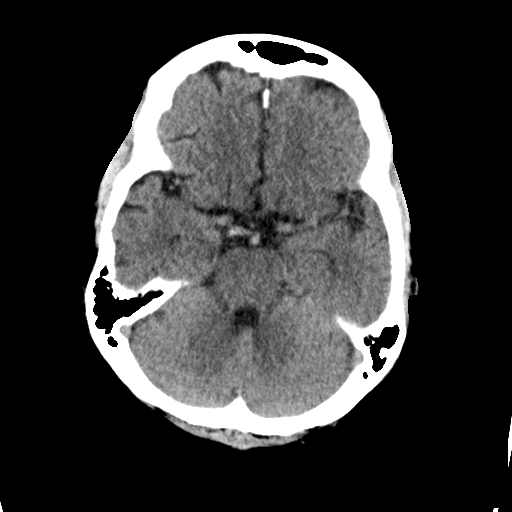
[im 12/32  brain]
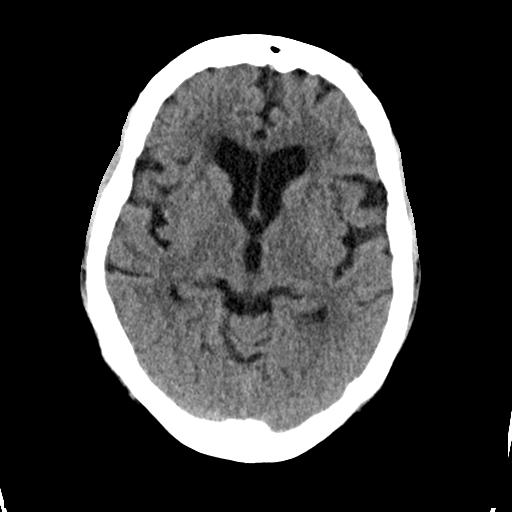
[im 16/32  brain]
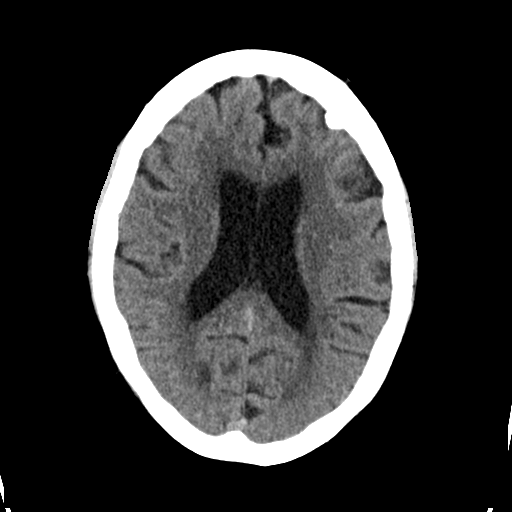
[im 20/32  brain]
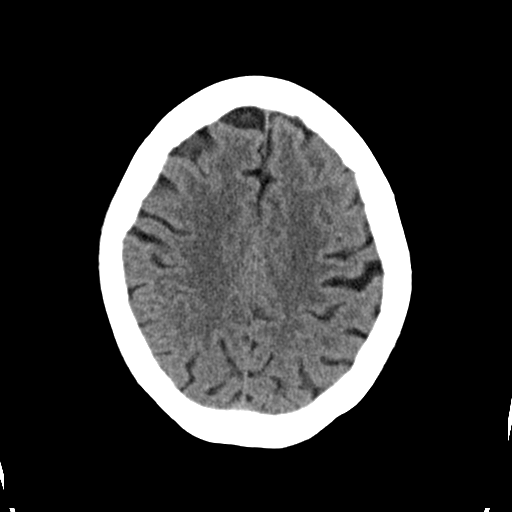
[im 20/32  bone]
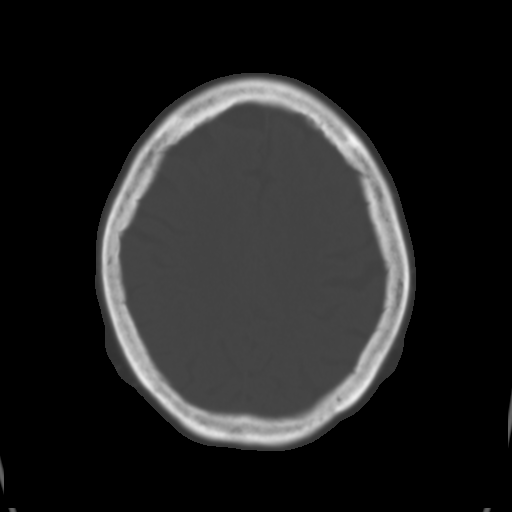
[im 24/32  brain]
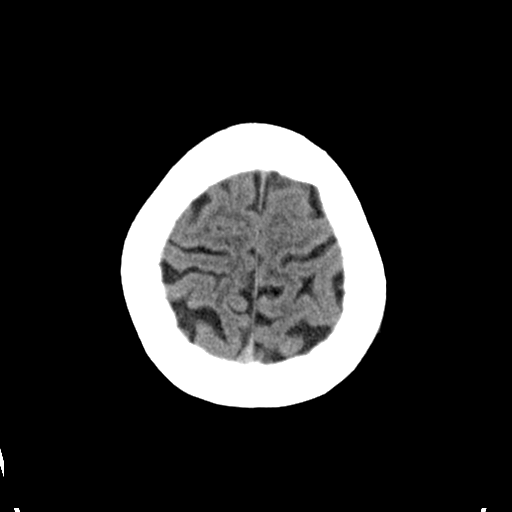
[im 28/32  brain]
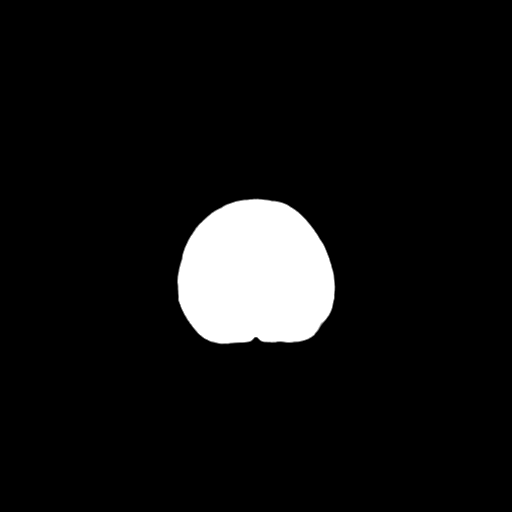

[Series 3: head bone · axial · 0.42mm/px · z∈[+1153,+1185]mm · 3 of 78 slices shown]
[im 8/78  bone]
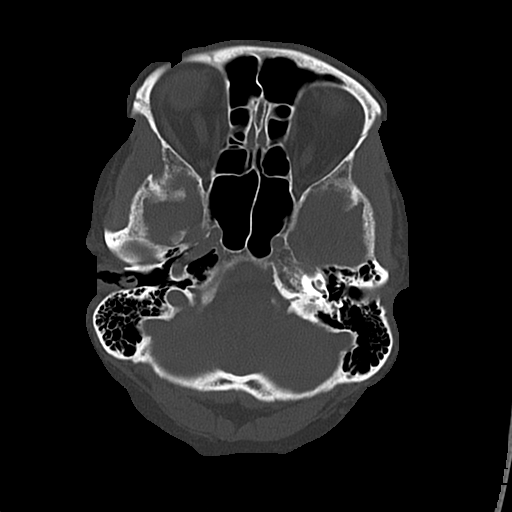
[im 16/78  bone]
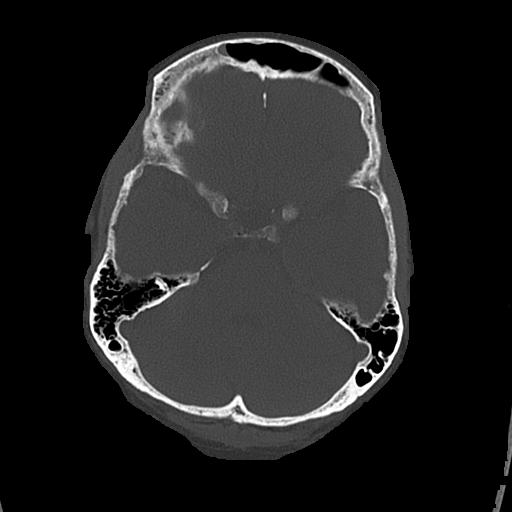
[im 24/78  bone]
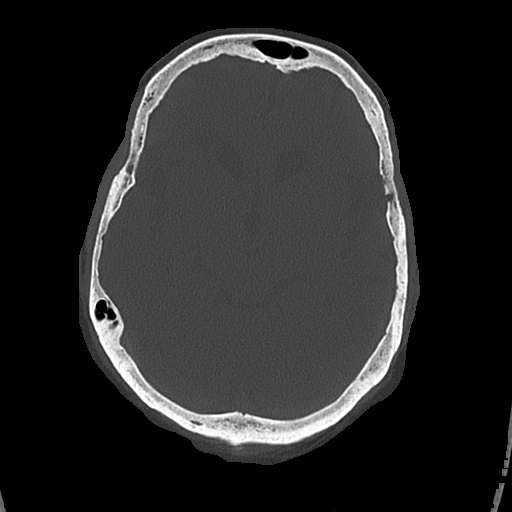

[Series 4: coronal soft · coronal · 0.32mm/px · 3 of 67 slices shown]
[im 23/67  brain]
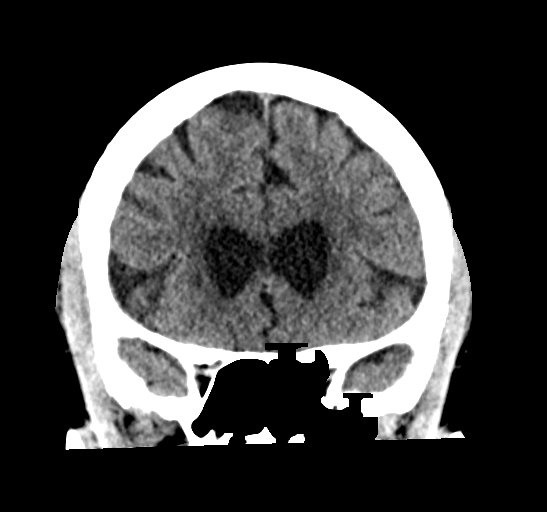
[im 30/67  brain]
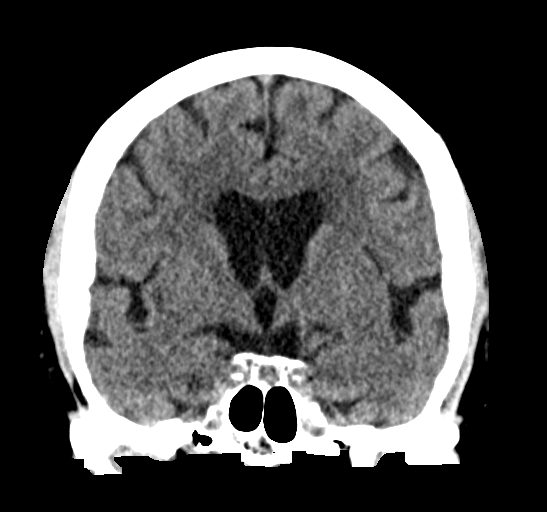
[im 37/67  brain]
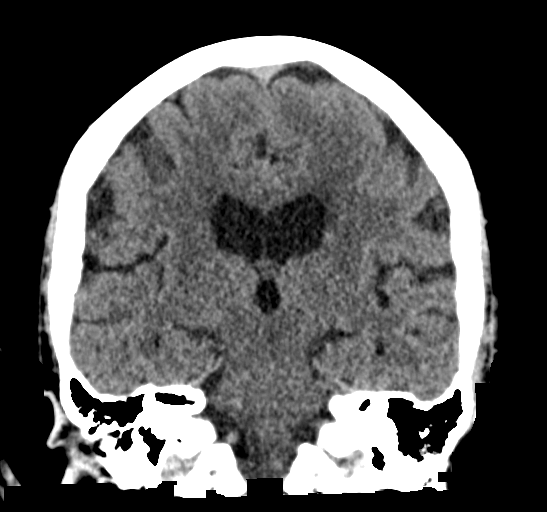

[Series 5: sagittal soft · sagittal · 0.32mm/px · 3 of 58 slices shown]
[im 20/58  brain]
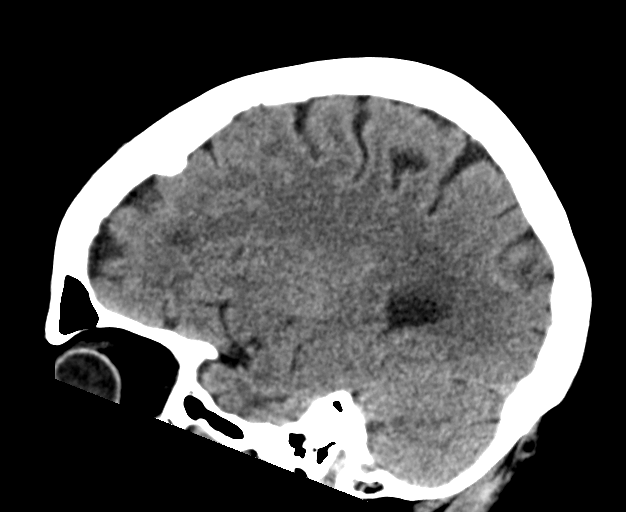
[im 29/58  brain]
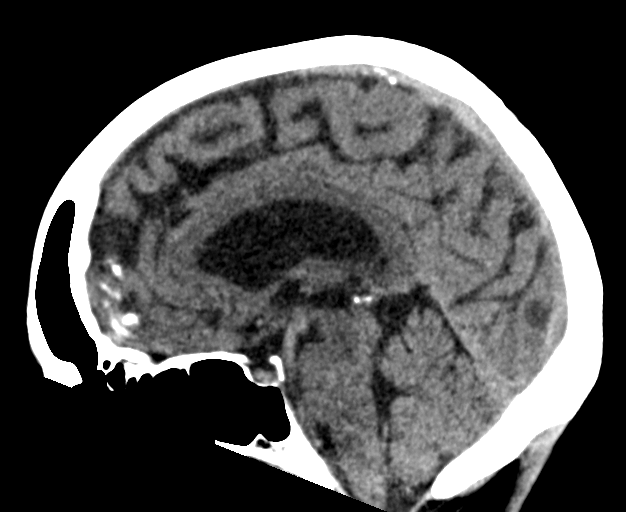
[im 39/58  brain]
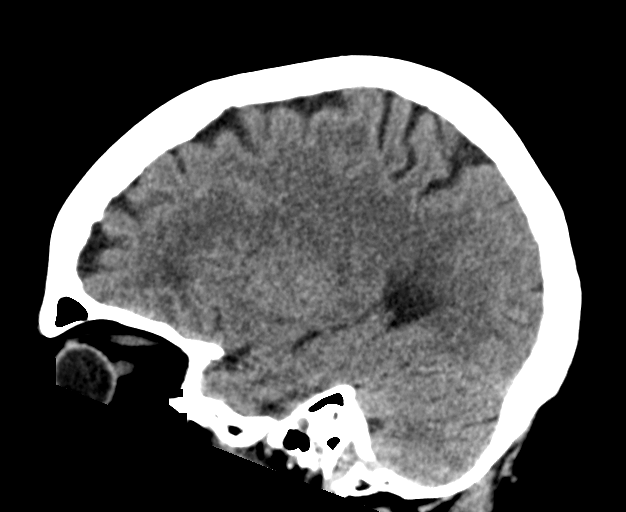

[16 of 47 positions shown; findings below may reference images not displayed]

FINDINGS: Brain: Generalized mild cerebral volume loss. No evidence of
parenchymal hemorrhage or extra-axial fluid collection. No mass
lesion, mass effect, or midline shift. No CT evidence of acute
infarction. Nonspecific mild to moderate subcortical and
periventricular white matter hypodensity, most in keeping with
chronic small vessel ischemic change. No ventriculomegaly.

Vascular: No acute abnormality.

Skull: No evidence of calvarial fracture. Mild osteitis frontalis
interna, unchanged. Mild superolateral left parietal scalp
contusion.

Sinuses/Orbits: The visualized paranasal sinuses are essentially
clear.

Other:  The mastoid air cells are unopacified.
IMPRESSION: 1. Mild superolateral left parietal scalp contusion.
2. No evidence of acute intracranial abnormality. No evidence of
calvarial fracture.
3. Generalized mild cerebral volume loss and mild-to-moderate
chronic small vessel ischemic changes in the cerebral white matter.

## 2023-04-02 IMAGING — CT CT CERVICAL SPINE W/O CM
3 series · 14 of 27 positions shown, 17 images · non-contrast
Comparison: None.

CLINICAL DATA: Neck trauma (Age >= 65y), fall today with head
injury, neck tenderness and lightheadedness



[Series 4: c spine soft · axial · 0.39mm/px · z∈[+1014,+1082]mm · 3 of 86 slices shown]
[im 18/86  soft-tissue]
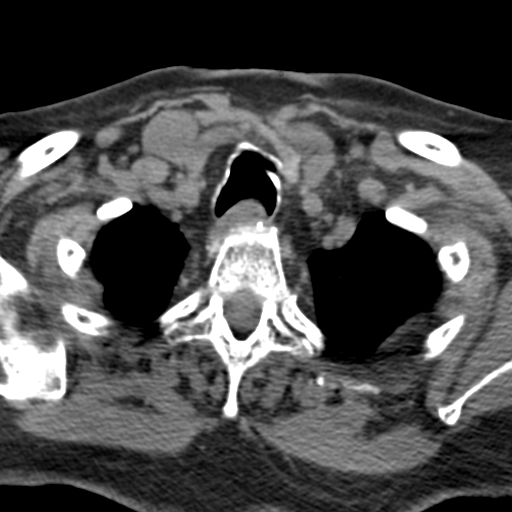
[im 35/86  soft-tissue]
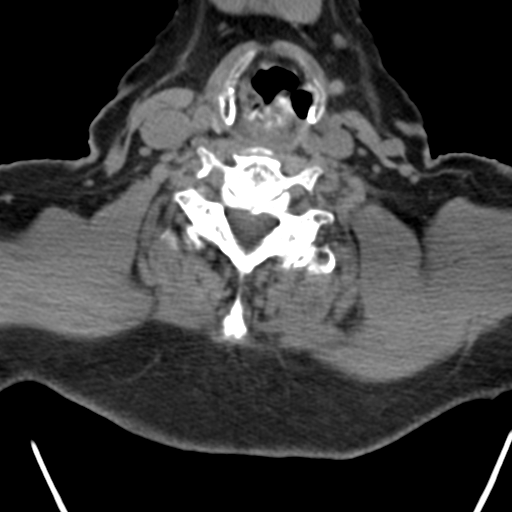
[im 52/86  soft-tissue]
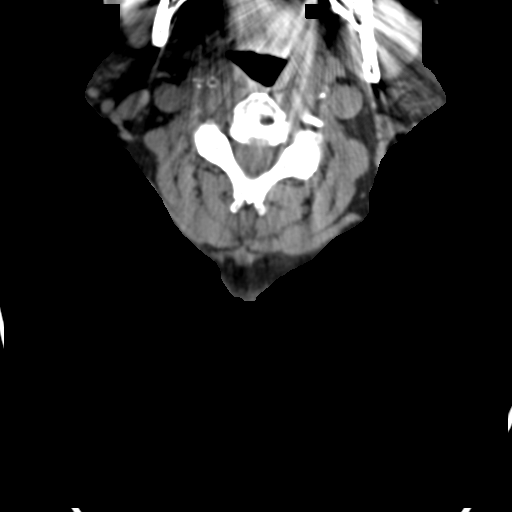

[Series 6: sag bone · sagittal · 0.25mm/px · 5 of 77 slices shown, 6 images]
[im 26/77  bone]
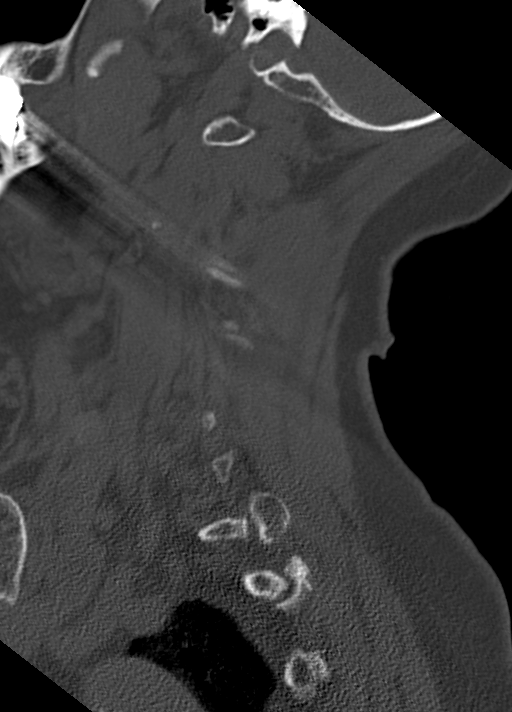
[im 32/77  bone]
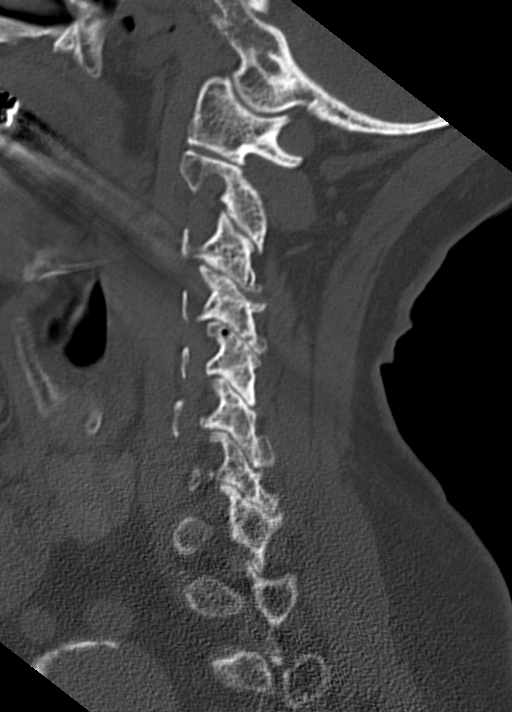
[im 39/77  soft-tissue]
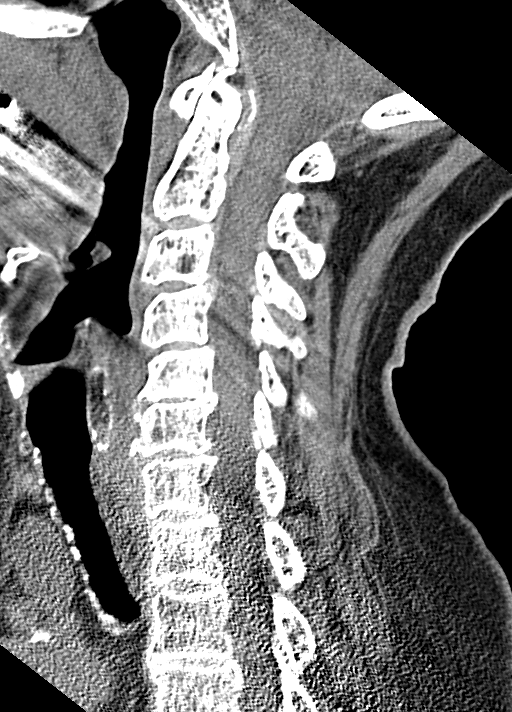
[im 39/77  bone]
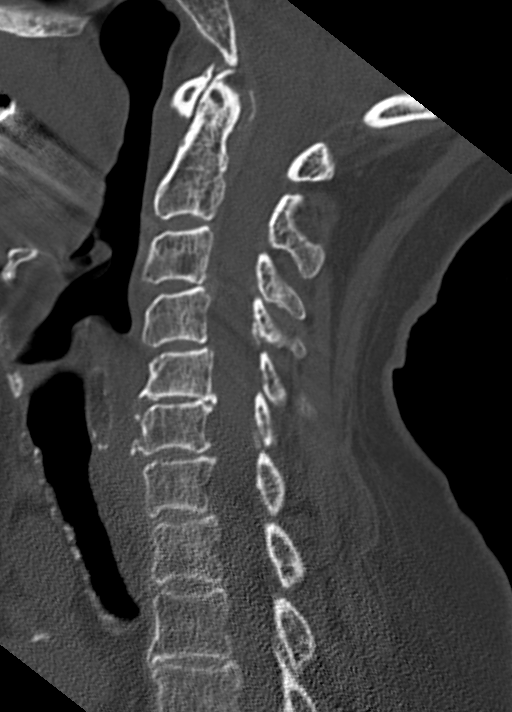
[im 45/77  bone]
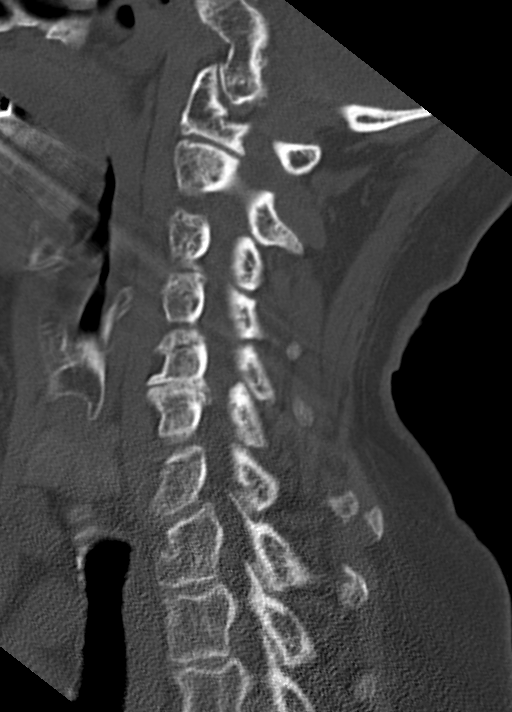
[im 51/77  bone]
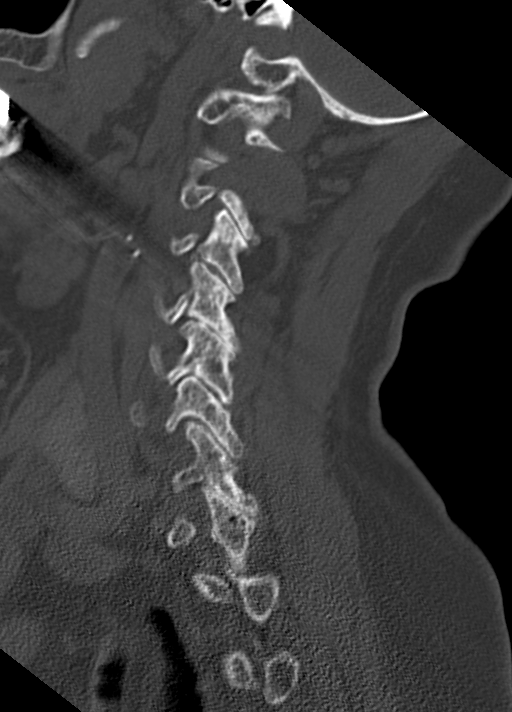

[Series 7: orthogonal axials · axial · 0.21mm/px · z∈[+984,+1100]mm · 6 of 104 slices shown, 8 images]
[im 15/104  soft-tissue]
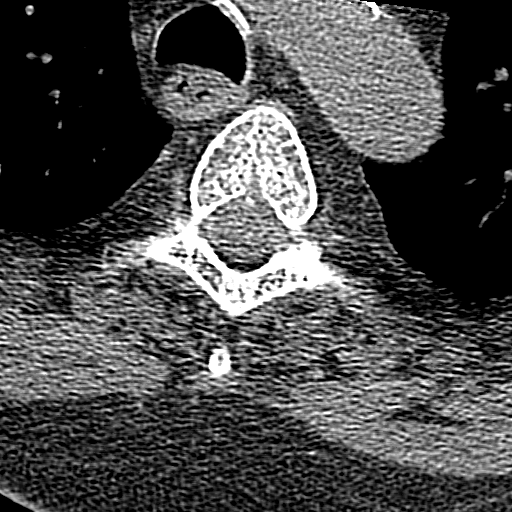
[im 15/104  bone]
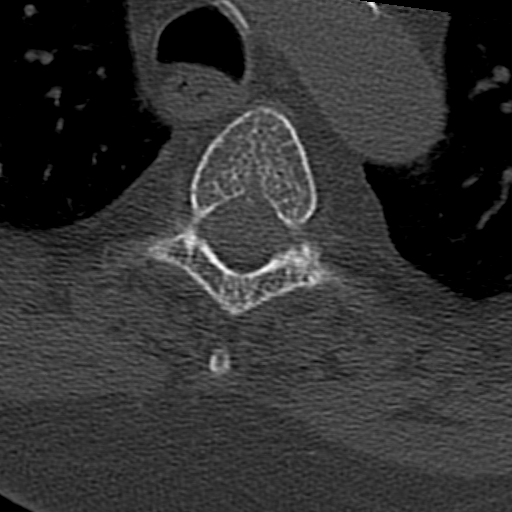
[im 30/104  bone]
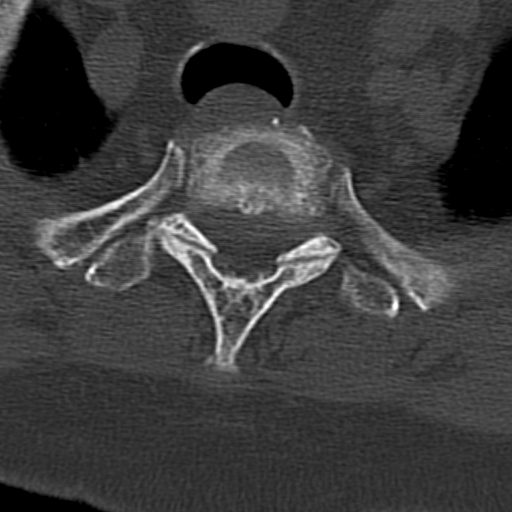
[im 45/104  bone]
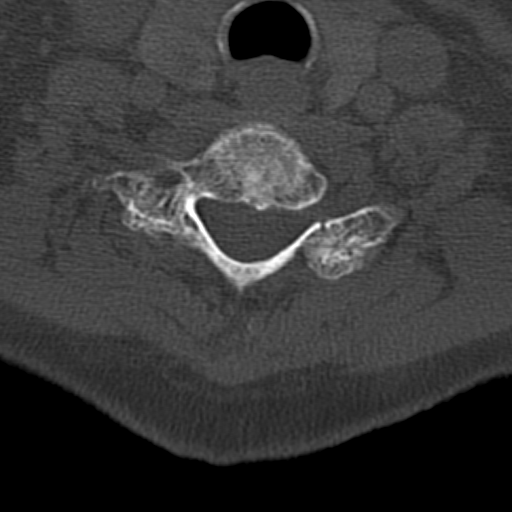
[im 59/104  bone]
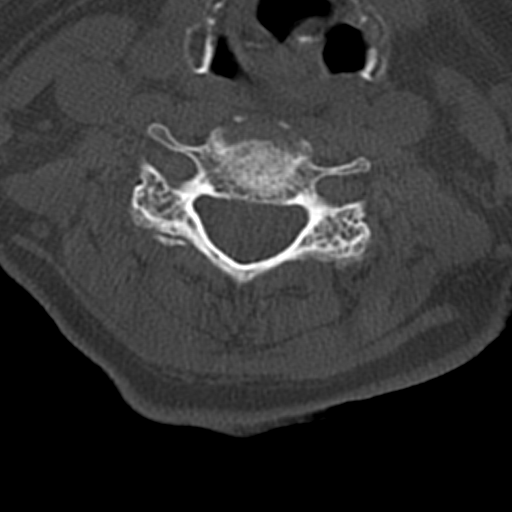
[im 74/104  soft-tissue]
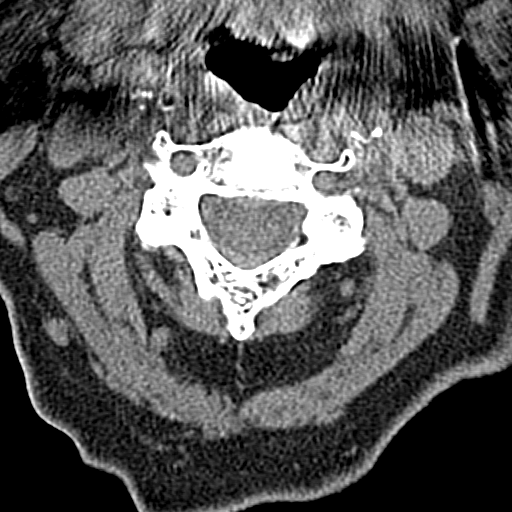
[im 74/104  bone]
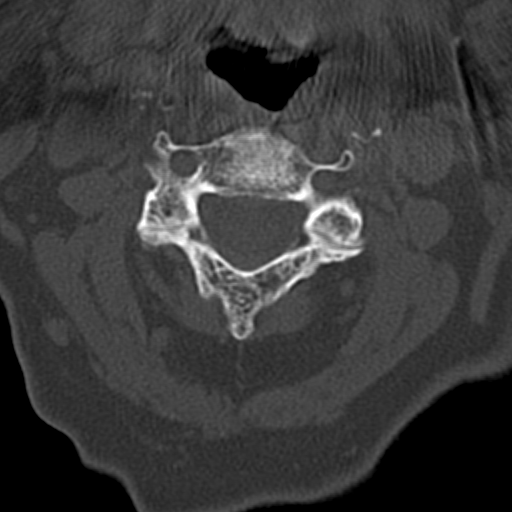
[im 89/104  bone]
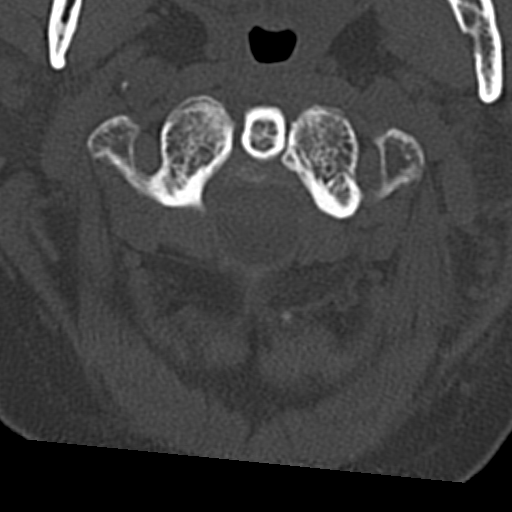

[14 of 27 positions shown; findings below may reference images not displayed]

FINDINGS: Alignment: Straightening of the cervical spine. No facet
subluxation. Dens is well positioned between the lateral masses of
C1.

Skull base and vertebrae: No acute fracture. No primary bone lesion
or focal pathologic process.

Soft tissues and spinal canal: No prevertebral edema. No visible
canal hematoma.

Disc levels: Moderate multilevel cervical degenerative disc disease,
most prominent at C5-6. Advanced bilateral facet arthropathy. Mild
degenerative foraminal stenosis on the right at C5-6.

Upper chest: No acute abnormality.

Other: Diffuse osteopenia. Visualized mastoid air cells appear
clear. No pathologically enlarged cervical lymph nodes.
Subcentimeter hypodense bilateral thyroid nodules. No follow-up
imaging is recommended. Reference: [HOSPITAL]. [DATE]):
IMPRESSION: 1. No evidence of acute fracture or subluxation in the cervical
spine.
2. Diffuse osteopenia.
3. Moderate multilevel cervical degenerative changes as described.

## 2023-04-18 ENCOUNTER — Ambulatory Visit: Payer: Medicare Other | Admitting: Family Medicine

## 2023-04-18 ENCOUNTER — Encounter: Payer: Self-pay | Admitting: Family Medicine

## 2023-04-18 VITALS — BP 118/68 | HR 76 | Temp 97.8°F | Ht 62.0 in | Wt 154.2 lb

## 2023-04-18 DIAGNOSIS — R051 Acute cough: Secondary | ICD-10-CM | POA: Diagnosis not present

## 2023-04-18 LAB — POC COVID19 BINAXNOW: SARS Coronavirus 2 Ag: NEGATIVE

## 2023-04-18 MED ORDER — BENZONATATE 100 MG PO CAPS: 100.0000 mg | ORAL_CAPSULE | Freq: Three times a day (TID) | ORAL | 0 refills | Status: DC | PRN

## 2023-04-18 MED ORDER — AMOXICILLIN 875 MG PO TABS: 875.0000 mg | ORAL_TABLET | Freq: Two times a day (BID) | ORAL | 0 refills | Status: AC

## 2023-04-18 NOTE — Patient Instructions (Signed)
Follow up as needed or as scheduled USE the cough pills as needed for cough ADD Robitussin or Delsym for cough Drink LOTS of fluids REST! This is most likely a viral illness and will improve w/ time IF your symptoms worsen over the weekend, start the Amoxicillin as directed Call with any questions or concerns Hang in there!

## 2023-04-18 NOTE — Progress Notes (Signed)
Subjective:    Patient ID: Jacqueline [name], female    DOB: May 16, 1935, 88 y.o.   MRN: [id number]  HPI URI- woke up Wednesday w/ 'a horrible sore throat'.  As the day went on, 'i started feeling bad'.  + fatigue, no energy.  + cough.  + HA.  Denies body aches.  + nasal congestion.  No known sick contacts (but husband is in exam room coughing).  + wet cough but not productive.  No known fevers.   Review of Systems For ROS see HPI     Objective:   Physical Exam Vitals reviewed.  Constitutional:      General: She is not in acute distress.    Appearance: She is well-developed. She is not ill-appearing.  HENT:     Head: Normocephalic and atraumatic.     Right Ear: Tympanic membrane and ear canal normal.     Left Ear: Tympanic membrane and ear canal normal.     Nose: Congestion present. No rhinorrhea.     Mouth/Throat:     Mouth: Mucous membranes are moist. No oral lesions.     Pharynx: No pharyngeal swelling, oropharyngeal exudate or posterior oropharyngeal erythema.     Tonsils: No tonsillar exudate.  Eyes:     Extraocular Movements: EOM normal.     Conjunctiva/sclera: Conjunctivae normal.     Pupils: Pupils are equal, round, and reactive to light.  Cardiovascular:     Rate and Rhythm: Normal rate and regular rhythm.     Heart sounds: Normal heart sounds. No murmur heard. Pulmonary:     Effort: Pulmonary effort is normal. No respiratory distress.     Breath sounds: Normal breath sounds. No wheezing.     Comments: Deep, wet cough Musculoskeletal:     Cervical back: Normal range of motion and neck supple.  Lymphadenopathy:     Cervical: No cervical adenopathy.  Skin:    General: Skin is warm and dry.     Findings: No rash.  Neurological:     Mental Status: She is alert.  Psychiatric:        Mood and Affect: Mood normal.        Behavior: Behavior normal.           Assessment & Plan:   Cough- new.  Most likely due to a viral illness as no evidence of bacterial  infxn on PE.  Given age and medical conditions, will provide course of abx for pt to start if sxs change or worsen over the weekend.  Cough meds prn.  Reviewed supportive care and red flags that should prompt return.  Pt expressed understanding and is in agreement w/ plan.

## 2023-04-28 ENCOUNTER — Other Ambulatory Visit: Payer: Self-pay | Admitting: Family Medicine

## 2023-04-29 NOTE — Telephone Encounter (Signed)
Last refill 12/16/2022 Last office visit 11/05/2022

## 2023-04-30 ENCOUNTER — Encounter: Payer: Self-pay | Admitting: Family Medicine

## 2023-05-01 NOTE — Telephone Encounter (Signed)
Pt kindly refused my suggestion to book an appt for the issues she mentioned she was having. She aked could she simply take a supplement as well as discuss it at her 'physical appt' I advised her no as she may be billed separately.

## 2023-05-01 NOTE — Telephone Encounter (Signed)
Patients can always discuss other things at their physicals, we can't tell them they can't. We can only make them aware of the billing difference if they do so. I am going to send this to Dr. Beverely Low so that she can see the patient's questions.

## 2023-05-07 ENCOUNTER — Encounter: Payer: Self-pay | Admitting: Family Medicine

## 2023-05-07 ENCOUNTER — Ambulatory Visit: Payer: Medicare Other | Admitting: Family Medicine

## 2023-05-07 VITALS — BP 122/68 | HR 70 | Temp 97.8°F | Ht 62.0 in | Wt 154.0 lb

## 2023-05-07 DIAGNOSIS — K146 Glossodynia: Secondary | ICD-10-CM

## 2023-05-07 DIAGNOSIS — R432 Parageusia: Secondary | ICD-10-CM | POA: Diagnosis not present

## 2023-05-07 NOTE — Progress Notes (Signed)
   Subjective:    Patient ID: Jacqueline [name], female    DOB: 21-Nov-1934, 88 y.o.   MRN: [id number]  HPI Loss of taste- pt reports she is gradually losing her taste over the past few months.  Things have worsened in the past 2-3 weeks.  Older sister has completely lost sense of taste.  Pt reports she can still taste but things are 'bland'.  Able to taste salty things, bitter things.  Sweet taste seems to be most impacted.  Is able to smell without difficulty.  CT brain from 4/23 w/o obvious abnormality.  Also has burning of her tongue.  Takes B12 daily.   Review of Systems For ROS see HPI     Objective:   Physical Exam Vitals reviewed.  Constitutional:      General: She is not in acute distress.    Appearance: Normal appearance. She is not ill-appearing.  HENT:     Head: Normocephalic and atraumatic.     Nose: No congestion or rhinorrhea.     Mouth/Throat:     Mouth: Mucous membranes are moist.     Pharynx: Oropharynx is clear. No oropharyngeal exudate or posterior oropharyngeal erythema.  Cardiovascular:     Rate and Rhythm: Normal rate and regular rhythm.  Musculoskeletal:     Cervical back: Neck supple. No tenderness.  Lymphadenopathy:     Cervical: No cervical adenopathy.  Skin:    General: Skin is warm and dry.  Neurological:     Mental Status: She is alert and oriented to person, place, and time. Mental status is at baseline.  Psychiatric:        Mood and Affect: Mood normal.        Behavior: Behavior normal.        Thought Content: Thought content normal.           Assessment & Plan:  Loss of taste- new to provider, ongoing for pt.  She reports this was gradual at first but seems to be more noticeable over the last few weeks.  She can still taste salty and bitter foods but has more difficulty w/ sweet things.  She also notes that her tongue has been burning recently.  She takes B12 daily and drinks water regularly.  Will refer to neuro for complete evaluation  but discussed that this may be a normal consequence of aging.  Pt expressed understanding and is in agreement w/ plan.

## 2023-05-07 NOTE — Patient Instructions (Signed)
Follow up as needed or as scheduled We'll call you to schedule your neurology appt Continue to drink LOTS of fluids Add bold spices to foods- garlic, citrus, cinnamon, etc- so you can taste more easily Call with any questions or concerns Stay Safe!  Stay Healthy! Happy Fall!!!

## 2023-06-04 ENCOUNTER — Ambulatory Visit: Payer: Medicare Other | Admitting: Family Medicine

## 2023-06-04 ENCOUNTER — Encounter: Payer: Self-pay | Admitting: Family Medicine

## 2023-06-04 ENCOUNTER — Ambulatory Visit (HOSPITAL_BASED_OUTPATIENT_CLINIC_OR_DEPARTMENT_OTHER)
Admission: RE | Admit: 2023-06-04 | Discharge: 2023-06-04 | Disposition: A | Discharge status: Home / Self Care | Payer: Medicare Other | Source: Ambulatory Visit | Attending: Family Medicine | Admitting: Family Medicine

## 2023-06-04 VITALS — BP 124/70 | HR 65 | Temp 97.8°F | Wt 155.0 lb

## 2023-06-04 DIAGNOSIS — M79672 Pain in left foot: Secondary | ICD-10-CM

## 2023-06-04 NOTE — Progress Notes (Signed)
   Subjective:    Patient ID: Jacqueline [name], female    DOB: 07-Jan-1935, 88 y.o.   MRN: [id number]  HPI Foot pain/swelling- pt wore new shoes on a weekend that she did a lot of walking.  This was ~6 weeks ago.  Afterwards, noticed that L 5th toe was very red, swollen, and sore.  Swelling and redness have improved but pain remains.     Review of Systems For ROS see HPI     Objective:   Physical Exam Vitals reviewed.  Constitutional:      General: She is not in acute distress.    Appearance: Normal appearance. She is not ill-appearing.  Cardiovascular:     Pulses: Normal pulses.  Musculoskeletal:        General: Swelling (swelling of L distal 5th metatarsal and PIP joint) and tenderness (TTP over L distal 5th metatarsal when compressed from the side. no TTP over 5th DIP or PIP and full ROM) present. No deformity.  Skin:    General: Skin is warm and dry.     Findings: Erythema (mild redness of L 5th metatarsal and PIP joint) present.  Neurological:     Mental Status: She is alert. Mental status is at baseline.  Psychiatric:        Mood and Affect: Mood normal.        Behavior: Behavior normal.        Thought Content: Thought content normal.           Assessment & Plan:   L foot pain- new.  L lateral foot is mildly red, slightly swollen, and only TTP when 5th metatarsal is compressed.  This is not consistent w/ gout.  Seems more significant than just footwear irritation given the length of pain.  Concern for possible stress fracture given the length of pain and swelling.  Will get xray to assess.  Encouraged wide but supportive footwear at all times.

## 2023-06-04 NOTE — Patient Instructions (Addendum)
Please go to the [employer] (at the intersection of [address]) Try and wear shoes w/ a wide toe box that are supportive ICE your foot for relief of pain and swelling Call with any questions or concerns Hang in there!!!

## 2023-06-06 ENCOUNTER — Other Ambulatory Visit: Payer: Self-pay | Admitting: Family Medicine

## 2023-07-07 ENCOUNTER — Encounter: Payer: Medicare Other | Admitting: Family Medicine

## 2023-07-08 ENCOUNTER — Encounter: Payer: Medicare Other | Admitting: Family Medicine

## 2023-07-24 ENCOUNTER — Other Ambulatory Visit: Payer: Self-pay | Admitting: Family Medicine

## 2023-07-25 ENCOUNTER — Ambulatory Visit (INDEPENDENT_AMBULATORY_CARE_PROVIDER_SITE_OTHER): Payer: Medicare Other | Admitting: Family Medicine

## 2023-07-25 ENCOUNTER — Encounter: Payer: Self-pay | Admitting: Family Medicine

## 2023-07-25 VITALS — BP 122/74 | HR 95 | Temp 97.7°F | Ht 62.0 in | Wt 157.5 lb

## 2023-07-25 DIAGNOSIS — Z0000 Encounter for general adult medical examination without abnormal findings: Secondary | ICD-10-CM | POA: Diagnosis not present

## 2023-07-25 DIAGNOSIS — I10 Essential (primary) hypertension: Secondary | ICD-10-CM

## 2023-07-25 DIAGNOSIS — E559 Vitamin D deficiency, unspecified: Secondary | ICD-10-CM | POA: Diagnosis not present

## 2023-07-25 LAB — CBC WITH DIFFERENTIAL/PLATELET
Basophils Absolute: 0 10*3/uL (ref 0.0–0.1)
Basophils Relative: 0.7 % (ref 0.0–3.0)
Eosinophils Absolute: 0.1 10*3/uL (ref 0.0–0.7)
Eosinophils Relative: 1.5 % (ref 0.0–5.0)
HCT: 42.7 % (ref 36.0–46.0)
Hemoglobin: 13.9 g/dL (ref 12.0–15.0)
Lymphocytes Relative: 28.9 % (ref 12.0–46.0)
Lymphs Abs: 2 10*3/uL (ref 0.7–4.0)
MCHC: 32.5 g/dL (ref 30.0–36.0)
MCV: 93.3 fL (ref 78.0–100.0)
Monocytes Absolute: 0.6 10*3/uL (ref 0.1–1.0)
Monocytes Relative: 7.9 % (ref 3.0–12.0)
Neutro Abs: 4.3 10*3/uL (ref 1.4–7.7)
Neutrophils Relative %: 61 % (ref 43.0–77.0)
Platelets: 148 10*3/uL — ABNORMAL LOW (ref 150.0–400.0)
RBC: 4.58 Mil/uL (ref 3.87–5.11)
RDW: 13.4 % (ref 11.5–15.5)
WBC: 7 10*3/uL (ref 4.0–10.5)

## 2023-07-25 LAB — TSH: TSH: 2.46 u[IU]/mL (ref 0.35–5.50)

## 2023-07-25 LAB — HEPATIC FUNCTION PANEL
ALT: 18 U/L (ref 0–35)
AST: 16 U/L (ref 0–37)
Albumin: 4 g/dL (ref 3.5–5.2)
Alkaline Phosphatase: 107 U/L (ref 39–117)
Bilirubin, Direct: 0.1 mg/dL (ref 0.0–0.3)
Total Bilirubin: 0.3 mg/dL (ref 0.2–1.2)
Total Protein: 6.7 g/dL (ref 6.0–8.3)

## 2023-07-25 LAB — BASIC METABOLIC PANEL
BUN: 19 mg/dL (ref 6–23)
CO2: 27 meq/L (ref 19–32)
Calcium: 8.7 mg/dL (ref 8.4–10.5)
Chloride: 112 meq/L (ref 96–112)
Creatinine, Ser: 1.22 mg/dL — ABNORMAL HIGH (ref 0.40–1.20)
GFR: 40.16 mL/min — ABNORMAL LOW (ref >60.00)
Glucose, Bld: 88 mg/dL (ref 70–99)
Potassium: 4.6 meq/L (ref 3.5–5.1)
Sodium: 144 meq/L (ref 135–145)

## 2023-07-25 LAB — LIPID PANEL
Cholesterol: 146 mg/dL (ref 0–200)
HDL: 40.3 mg/dL (ref >39.00)
LDL Cholesterol: 73 mg/dL (ref 0–99)
NonHDL: 105.43
Total CHOL/HDL Ratio: 4
Triglycerides: 162 mg/dL — ABNORMAL HIGH (ref 0.0–149.0)
VLDL: 32.4 mg/dL (ref 0.0–40.0)

## 2023-07-25 LAB — VITAMIN D 25 HYDROXY (VIT D DEFICIENCY, FRACTURES): VITD: 38.25 ng/mL (ref 30.00–100.00)

## 2023-07-25 NOTE — Patient Instructions (Signed)
Follow up in 6 months to recheck BP and cholesterol We'll notify you of your lab results and make any changes if needed Keep up the good work!  You look great! Call with any questions or concerns Stay Safe!  Stay Healthy! Happy New Year!!

## 2023-07-25 NOTE — Progress Notes (Unsigned)
   Subjective:    Patient ID: Jacqueline [name], female    DOB: 05-May-1935, 89 y.o.   MRN: [id number]  HPI CPE- UTD on PNA and flu shots.  No longer doing colonoscopy  Patient Care Team    Relationship Specialty Notifications Start End  Sheliah Hatch, MD PCP - General Family Medicine  11/28/15   Maris Berger, MD Consulting Physician Ophthalmology  05/17/16   Barnett Abu, MD Consulting Physician Neurosurgery  02/26/17   Salvatore Marvel, MD Consulting Physician Orthopedic Surgery  05/28/17   Elmon Else, MD Consulting Physician Dermatology  05/28/17   Griselda Miner, MD Consulting Physician General Surgery  12/10/17   Serena Croissant, MD Consulting Physician Hematology and Oncology  12/10/17   Loa Socks, NP Nurse Practitioner Hematology and Oncology  12/10/17     Health Maintenance  Topic Date Due   Medicare Annual Wellness (AWV)  01/08/2022   DTaP/Tdap/Td (3 - Td or Tdap) 06/08/2022   Colonoscopy  05/06/2023   COVID-19 Vaccine (8 - 2024-25 season) 05/12/2023   Pneumonia Vaccine 73+ Years old  Completed   INFLUENZA VACCINE  Completed   DEXA SCAN  Completed   Zoster Vaccines- Shingrix  Completed   HPV VACCINES  Aged Out      Review of Systems Patient reports no vision/ hearing changes, adenopathy,fever, weight change,  persistant/recurrent hoarseness , swallowing issues, chest pain, palpitations, edema, persistant/recurrent cough, hemoptysis, dyspnea (rest/exertional/paroxysmal nocturnal), gastrointestinal bleeding (melena, rectal bleeding), abdominal pain, significant heartburn, bowel changes, GU symptoms (dysuria, hematuria, incontinence), Gyn symptoms (abnormal  bleeding, pain),  syncope, focal weakness, memory loss, numbness & tingling, skin/hair/nail changes, abnormal bruising or bleeding, anxiety, or depression.     Objective:   Physical Exam General Appearance:    Alert, cooperative, no distress, appears stated age  Head:    Normocephalic, without  obvious abnormality, atraumatic  Eyes:    PERRL, conjunctiva/corneas clear, EOM's intact both eyes  Ears:    Normal TM's and external ear canals, both ears  Nose:   Nares normal, septum midline, mucosa normal, no drainage    or sinus tenderness  Throat:   Lips, mucosa, and tongue normal; teeth and gums normal  Neck:   Supple, symmetrical, trachea midline, no adenopathy;    Thyroid: no enlargement/tenderness/nodules  Back:     Symmetric, no curvature, ROM normal, no CVA tenderness  Lungs:     Clear to auscultation bilaterally, respirations unlabored  Chest Wall:    No tenderness or deformity   Heart:    Regular rate and rhythm, S1 and S2 normal, no murmur, rub   or gallop  Breast Exam:    Deferred  Abdomen:     Soft, non-tender, bowel sounds active all four quadrants,    no masses, no organomegaly  Genitalia:    Deferred  Rectal:    Extremities:   Extremities normal, atraumatic, no cyanosis or edema  Pulses:   2+ and symmetric all extremities  Skin:   Skin color, texture, turgor normal, no rashes or lesions  Lymph nodes:   Cervical, supraclavicular, and axillary nodes normal  Neurologic:   CNII-XII intact, normal strength, sensation and reflexes    throughout          Assessment & Plan:

## 2023-07-27 ENCOUNTER — Encounter: Payer: Self-pay | Admitting: Family Medicine

## 2023-07-27 NOTE — Assessment & Plan Note (Signed)
Pt's PE WNL and unchanged from previous.  UTD on PNA and flu shots.  No longer doing colonoscopy or mammo.  Check labs.  Anticipatory guidance provided.

## 2023-07-27 NOTE — Assessment & Plan Note (Signed)
Check labs and replete prn.

## 2023-07-27 NOTE — Assessment & Plan Note (Signed)
Chronic problem.  Well controlled.  Currently asymptomatic

## 2023-07-28 ENCOUNTER — Other Ambulatory Visit: Payer: Self-pay

## 2023-07-28 ENCOUNTER — Telehealth: Payer: Self-pay

## 2023-07-28 DIAGNOSIS — R944 Abnormal results of kidney function studies: Secondary | ICD-10-CM

## 2023-07-28 NOTE — Telephone Encounter (Signed)
-----   Message from Neena Rhymes sent at 07/27/2023  5:20 PM EST ----- Labs look good w/ exception of mildly elevated serum creatinine (kidney function) which leads to a mildly decreased GFR (kidney filtration rate).  Please increase your water intake and we'll repeat your BMP at a lab only visit in 1 week (BMP, dx decreased GFR)

## 2023-07-28 NOTE — Telephone Encounter (Signed)
Pt has lab visit 08/04/2023. She wanted little more time to drink water

## 2023-08-04 ENCOUNTER — Other Ambulatory Visit: Payer: Medicare Other

## 2023-08-04 ENCOUNTER — Other Ambulatory Visit (INDEPENDENT_AMBULATORY_CARE_PROVIDER_SITE_OTHER): Payer: Medicare Other

## 2023-08-04 ENCOUNTER — Encounter: Payer: Self-pay | Admitting: Family Medicine

## 2023-08-04 ENCOUNTER — Telehealth: Payer: Self-pay

## 2023-08-04 DIAGNOSIS — R944 Abnormal results of kidney function studies: Secondary | ICD-10-CM

## 2023-08-04 LAB — BASIC METABOLIC PANEL
BUN: 18 mg/dL (ref 6–23)
CO2: 26 meq/L (ref 19–32)
Calcium: 9 mg/dL (ref 8.4–10.5)
Chloride: 108 meq/L (ref 96–112)
Creatinine, Ser: 1.26 mg/dL — ABNORMAL HIGH (ref 0.40–1.20)
GFR: 38.63 mL/min — ABNORMAL LOW (ref >60.00)
Glucose, Bld: 104 mg/dL — ABNORMAL HIGH (ref 70–99)
Potassium: 4.6 meq/L (ref 3.5–5.1)
Sodium: 140 meq/L (ref 135–145)

## 2023-08-04 NOTE — Telephone Encounter (Signed)
-----   Message from Neena Rhymes sent at 08/04/2023  2:51 PM EST ----- Your kidney function is stable.  Please make sure you are drinking plenty of water to flush through your kidneys

## 2023-08-04 NOTE — Telephone Encounter (Signed)
 Pt has reviewed via MyChart

## 2023-08-13 ENCOUNTER — Other Ambulatory Visit: Payer: Self-pay | Admitting: Family Medicine

## 2023-08-27 ENCOUNTER — Institutional Professional Consult (permissible substitution): Payer: Medicare Other | Admitting: Diagnostic Neuroimaging

## 2023-09-01 ENCOUNTER — Other Ambulatory Visit: Payer: Self-pay | Admitting: Family Medicine

## 2023-09-06 ENCOUNTER — Other Ambulatory Visit: Payer: Self-pay | Admitting: Family Medicine

## 2023-11-08 ENCOUNTER — Other Ambulatory Visit: Payer: Self-pay | Admitting: Family Medicine

## 2023-12-02 ENCOUNTER — Other Ambulatory Visit: Payer: Self-pay | Admitting: Family Medicine

## 2024-01-19 ENCOUNTER — Ambulatory Visit: Payer: Self-pay

## 2024-01-19 NOTE — Telephone Encounter (Signed)
 FYI Only or Action Required?: FYI only for provider.  Patient was last seen in primary care on 07/25/2023 by Mahlon Comer BRAVO, MD.  Called Nurse Triage reporting Covid Positive.  Symptoms began a week ago.  Interventions attempted: Rest, hydration, or home remedies.  Symptoms are: gradually improving.  Triage Disposition: Call PCP Within 24 Hours  Patient/caregiver understands and will follow disposition?:   Message from [place] M sent at 01/19/2024  9:41 AM EDT  Patient was around her granddaughter last weekend and she had covid. Patient tested positive on 7/20 wanting to know if she should be taking anything to help with symptoms   Reason for Disposition  [1] HIGH RISK patient (e.g., weak immune system, age > 64 years, obesity with BMI 30 or higher, pregnant, chronic lung disease or other chronic medical condition) AND [2] COVID symptoms (e.g., cough, fever)  (Exceptions: Already seen by PCP and no new or worsening symptoms.)  COVID-19 Home Isolation, questions about  Answer Assessment - Initial Assessment Questions 1. COVID-19 DIAGNOSIS: How do you know that you have COVID? (e.g., positive lab test or self-test, diagnosed by doctor or NP/PA, symptoms after exposure).     Home test  2. COVID-19 EXPOSURE: Was there any known exposure to COVID before the symptoms began? CDC Definition of close contact: within 6 feet (2 meters) for a total of 15 minutes or more over a 24-hour period.      yes 3. ONSET: When did the COVID-19 symptoms start?      01/18/24 4. WORST SYMPTOM: What is your worst symptom? (e.g., cough, fever, shortness of breath, muscle aches)     Scratchy throat 5. COUGH: Do you have a cough? If Yes, ask: How bad is the cough?       mild 6. FEVER: Do you have a fever? If Yes, ask: What is your temperature, how was it measured, and when did it start?     denies 7. RESPIRATORY STATUS: Describe your breathing? (e.g., normal; shortness of breath,  wheezing, unable to speak)      Denies  8. BETTER-SAME-WORSE: Are you getting better, staying the same or getting worse compared to yesterday?  If getting worse, ask, In what way?     better 9. OTHER SYMPTOMS: Do you have any other symptoms?  (e.g., chills, fatigue, headache, loss of smell or taste, muscle pain, sore throat)     Headache last week.  Additional info: Symptoms started last week with headache and scratchy throat, symptoms have now improved. She took Covid test 01/18/24 because her granddaughter had let her know she was Covid positive.  Patient called for general care advice and quarantine questions.  Protocols used: Coronavirus (COVID-19) Diagnosed or Suspected-A-AH

## 2024-01-19 NOTE — Telephone Encounter (Signed)
 Please see message below and see if patient would like to schedule an e-visit?

## 2024-01-19 NOTE — Telephone Encounter (Signed)
 Pt denied the option for e-visit, stated she is feeling better. Symptoms started 7/14, first positive test on 7/20. Told her if she changes her mind or starts feeling bad again to schedule an appointment and e-visit would be an option.

## 2024-01-23 NOTE — Telephone Encounter (Signed)
 Patient tested positive for covid last week and took a test everyday until she tested negative today, now husband tested positive today. Patient is wondering what they should do?

## 2024-01-23 NOTE — Telephone Encounter (Unsigned)
 Copied from CRM [id number]. Topic: Clinical - Medical Advice >> Jan 23, 2024  1:50 PM [place] wrote: Reason for CRM: Patient tested negative today for covid after testing positive everyday this week and husband still tested positive today, wanting to know what she should do, please reach out patient as soon as possible today

## 2024-01-26 NOTE — Telephone Encounter (Signed)
 Patient is now wondering how long she has to wait to be around people?

## 2024-02-03 ENCOUNTER — Encounter: Payer: Self-pay | Admitting: Family Medicine

## 2024-02-03 ENCOUNTER — Ambulatory Visit: Payer: Medicare Other | Admitting: Family Medicine

## 2024-02-03 VITALS — BP 120/68 | HR 74 | Temp 97.9°F | Ht 62.0 in | Wt 157.1 lb

## 2024-02-03 DIAGNOSIS — E785 Hyperlipidemia, unspecified: Secondary | ICD-10-CM

## 2024-02-03 DIAGNOSIS — K582 Mixed irritable bowel syndrome: Secondary | ICD-10-CM | POA: Diagnosis not present

## 2024-02-03 DIAGNOSIS — I10 Essential (primary) hypertension: Secondary | ICD-10-CM | POA: Diagnosis not present

## 2024-02-03 DIAGNOSIS — D2330 Other benign neoplasm of skin of unspecified part of face: Secondary | ICD-10-CM | POA: Insufficient documentation

## 2024-02-03 LAB — CBC WITH DIFFERENTIAL/PLATELET
Basophils Absolute: 0 K/uL (ref 0.0–0.1)
Basophils Relative: 0.5 % (ref 0.0–3.0)
Eosinophils Absolute: 0.1 K/uL (ref 0.0–0.7)
Eosinophils Relative: 1.2 % (ref 0.0–5.0)
HCT: 40.8 % (ref 36.0–46.0)
Hemoglobin: 13.6 g/dL (ref 12.0–15.0)
Lymphocytes Relative: 27.3 % (ref 12.0–46.0)
Lymphs Abs: 1.9 K/uL (ref 0.7–4.0)
MCHC: 33.2 g/dL (ref 30.0–36.0)
MCV: 91.1 fl (ref 78.0–100.0)
Monocytes Absolute: 0.6 K/uL (ref 0.1–1.0)
Monocytes Relative: 8.2 % (ref 3.0–12.0)
Neutro Abs: 4.4 K/uL (ref 1.4–7.7)
Neutrophils Relative %: 62.8 % (ref 43.0–77.0)
Platelets: 139 K/uL — ABNORMAL LOW (ref 150.0–400.0)
RBC: 4.48 Mil/uL (ref 3.87–5.11)
RDW: 12.9 % (ref 11.5–15.5)
WBC: 7 K/uL (ref 4.0–10.5)

## 2024-02-03 LAB — LIPID PANEL
Cholesterol: 148 mg/dL (ref 0–200)
HDL: 40.3 mg/dL (ref >39.00)
LDL Cholesterol: 72 mg/dL (ref 0–99)
NonHDL: 107.37
Total CHOL/HDL Ratio: 4
Triglycerides: 179 mg/dL — ABNORMAL HIGH (ref 0.0–149.0)
VLDL: 35.8 mg/dL (ref 0.0–40.0)

## 2024-02-03 LAB — BASIC METABOLIC PANEL WITH GFR
BUN: 18 mg/dL (ref 6–23)
CO2: 29 meq/L (ref 19–32)
Calcium: 9.6 mg/dL (ref 8.4–10.5)
Chloride: 107 meq/L (ref 96–112)
Creatinine, Ser: 1.15 mg/dL (ref 0.40–1.20)
GFR: 42.95 mL/min — ABNORMAL LOW (ref >60.00)
Glucose, Bld: 94 mg/dL (ref 70–99)
Potassium: 5 meq/L (ref 3.5–5.1)
Sodium: 140 meq/L (ref 135–145)

## 2024-02-03 LAB — TSH: TSH: 2.29 u[IU]/mL (ref 0.35–5.50)

## 2024-02-03 LAB — HEPATIC FUNCTION PANEL
ALT: 18 U/L (ref 0–35)
AST: 16 U/L (ref 0–37)
Albumin: 3.8 g/dL (ref 3.5–5.2)
Alkaline Phosphatase: 84 U/L (ref 39–117)
Bilirubin, Direct: 0.1 mg/dL (ref 0.0–0.3)
Total Bilirubin: 0.5 mg/dL (ref 0.2–1.2)
Total Protein: 7.1 g/dL (ref 6.0–8.3)

## 2024-02-03 NOTE — Progress Notes (Signed)
   Subjective:    Patient ID: MORNING HALBERG, female    DOB: 04-23-1935, 89 y.o.   MRN: [id number]  HPI HTN- chronic problem, on Amlodipine  5mg  daily, Olmesartan  40mg  daily w/ good control.  No CP, SOB, HA's, visual changes, edema.  Hyperlipidemia- chronic problem, on Lipitor 10mg  daily.  No abd pain, N/V.  IBS- pt reports ongoing sxs.  She takes Imodium every other day w/ fairly good control of diarrhea but continues to have gas and bloating.  Wants to know if there's anything else to take   Review of Systems For ROS see HPI     Objective:   Physical Exam Vitals reviewed.  Constitutional:      General: She is not in acute distress.    Appearance: Normal appearance. She is well-developed. She is not ill-appearing.  HENT:     Head: Normocephalic and atraumatic.  Eyes:     Conjunctiva/sclera: Conjunctivae normal.     Pupils: Pupils are equal, round, and reactive to light.  Neck:     Thyroid : No thyromegaly.  Cardiovascular:     Rate and Rhythm: Normal rate and regular rhythm.     Heart sounds: Normal heart sounds. No murmur heard. Pulmonary:     Effort: Pulmonary effort is normal. No respiratory distress.     Breath sounds: Normal breath sounds.  Abdominal:     General: There is no distension.     Palpations: Abdomen is soft.     Tenderness: There is no abdominal tenderness.  Musculoskeletal:     Cervical back: Normal range of motion and neck supple.  Lymphadenopathy:     Cervical: No cervical adenopathy.  Skin:    General: Skin is warm and dry.  Neurological:     Mental Status: She is alert and oriented to person, place, and time.  Psychiatric:        Behavior: Behavior normal.           Assessment & Plan:

## 2024-02-03 NOTE — Patient Instructions (Addendum)
 Schedule your complete physical in 6 months We'll notify you of your lab results and make any changes if needed Keep up the good work on healthy diet and regular exercise- you look great! Try adding a daily Probiotic (Align, Florastor) to help w/ digestion Call with any questions or concerns Stay Safe!  Stay Healthy! Enjoy the rest of your summer!!!

## 2024-02-03 NOTE — Assessment & Plan Note (Signed)
 Chronic problem.  On Lipitor 10mg  daily w/o difficulty.  Check labs.  Adjust meds prn

## 2024-02-03 NOTE — Assessment & Plan Note (Signed)
 Chronic problem.  Currently well controlled on Amlodipine  and Olmesartan .  Asymptomatic.  Check labs due to ARB use but no anticipated med changes.  Will follow.

## 2024-02-03 NOTE — Assessment & Plan Note (Signed)
 Ongoing issue for pt.  Will start Probiotic daily to see if gas/bloating improves.  Pt expressed understanding and is in agreement w/ plan.

## 2024-02-04 ENCOUNTER — Ambulatory Visit: Payer: Self-pay | Admitting: Family Medicine

## 2024-02-26 ENCOUNTER — Other Ambulatory Visit: Payer: Self-pay

## 2024-02-26 ENCOUNTER — Other Ambulatory Visit: Payer: Self-pay | Admitting: Family Medicine

## 2024-02-26 MED ORDER — MIRTAZAPINE 15 MG PO TABS: 15.0000 mg | ORAL_TABLET | Freq: Every day | ORAL | 3 refills | Status: AC

## 2024-02-26 MED ORDER — POTASSIUM CHLORIDE ER 10 MEQ PO TBCR: 10.0000 meq | EXTENDED_RELEASE_TABLET | Freq: Every day | ORAL | 0 refills | Status: DC

## 2024-02-26 NOTE — Telephone Encounter (Signed)
 Copied from CRM #[id number]. Topic: Clinical - Medication Question >> Feb 26, 2024  1:26 PM Chiquita SQUIBB wrote: Reason for CRM: Patient is calling in regarding the mirtazapine  (REMERON ) 15 MG tablet, patient realized she only has one pill left and her delivery is set for another week. Patient is asking if she can have a weeks worth of the medication sent to [facility] in [place]. Please advise the patient.

## 2024-03-18 ENCOUNTER — Telehealth: Payer: Self-pay | Admitting: Family Medicine

## 2024-03-18 MED ORDER — POTASSIUM CHLORIDE ER 10 MEQ PO TBCR: 10.0000 meq | EXTENDED_RELEASE_TABLET | Freq: Every day | ORAL | 0 refills | Status: DC

## 2024-03-18 NOTE — Telephone Encounter (Signed)
 Encourage patient to contact the pharmacy for refills or they can request refills through [facility]  (Please schedule appointment if patient has not been seen in over a year)    WHAT PHARMACY WOULD THEY LIKE THIS SENT TO:  [facility] - [place] - [address]   MEDICATION NAME & DOSE: potassium chloride  (KLOR-CON ) 10 MEQ tablet   NOTES/COMMENTS FROM PATIENT: [facility] [place] - Received a refill request from [facility] for this medication - Form in refill folder     Front office please notify patient: It takes 48-72 hours to process rx refill requests Ask patient to call pharmacy to ensure rx is ready before heading there.

## 2024-03-18 NOTE — Telephone Encounter (Signed)
 Called patient and asked if she picked up 02/26/24 90D rx of potassium. Patient did, she wants refills to be sent to [facility]. I advise that they may not fill it until it is due. Patient is fine with that.

## 2024-03-18 NOTE — Addendum Note (Signed)
 Addended by: HONOR BERN A on: 03/18/2024 11:38 AM   Modules accepted: Orders

## 2024-03-30 ENCOUNTER — Other Ambulatory Visit: Payer: Self-pay | Admitting: Family Medicine

## 2024-06-22 ENCOUNTER — Other Ambulatory Visit: Payer: Self-pay | Admitting: Family Medicine

## 2024-07-27 ENCOUNTER — Encounter: Payer: Self-pay | Admitting: Family Medicine

## 2024-07-27 ENCOUNTER — Ambulatory Visit (INDEPENDENT_AMBULATORY_CARE_PROVIDER_SITE_OTHER): Admitting: Family Medicine

## 2024-07-27 ENCOUNTER — Ambulatory Visit: Payer: Self-pay | Admitting: Family Medicine

## 2024-07-27 VITALS — BP 110/64 | HR 74 | Temp 97.6°F | Resp 12 | Ht 62.0 in | Wt 155.8 lb

## 2024-07-27 DIAGNOSIS — Z0000 Encounter for general adult medical examination without abnormal findings: Secondary | ICD-10-CM

## 2024-07-27 DIAGNOSIS — I10 Essential (primary) hypertension: Secondary | ICD-10-CM

## 2024-07-27 DIAGNOSIS — E559 Vitamin D deficiency, unspecified: Secondary | ICD-10-CM | POA: Diagnosis not present

## 2024-07-27 DIAGNOSIS — E785 Hyperlipidemia, unspecified: Secondary | ICD-10-CM

## 2024-07-27 LAB — CBC WITH DIFFERENTIAL/PLATELET
Basophils Absolute: 0 10*3/uL (ref 0.0–0.1)
Basophils Relative: 0.5 % (ref 0.0–3.0)
Eosinophils Absolute: 0.1 10*3/uL (ref 0.0–0.7)
Eosinophils Relative: 2.2 % (ref 0.0–5.0)
HCT: 41.9 % (ref 36.0–46.0)
Hemoglobin: 13.9 g/dL (ref 12.0–15.0)
Lymphocytes Relative: 27.5 % (ref 12.0–46.0)
Lymphs Abs: 1.8 10*3/uL (ref 0.7–4.0)
MCHC: 33.2 g/dL (ref 30.0–36.0)
MCV: 92.6 fl (ref 78.0–100.0)
Monocytes Absolute: 0.5 10*3/uL (ref 0.1–1.0)
Monocytes Relative: 8.2 % (ref 3.0–12.0)
Neutro Abs: 4 10*3/uL (ref 1.4–7.7)
Neutrophils Relative %: 61.6 % (ref 43.0–77.0)
Platelets: 173 10*3/uL (ref 150.0–400.0)
RBC: 4.52 Mil/uL (ref 3.87–5.11)
RDW: 12.8 % (ref 11.5–15.5)
WBC: 6.5 10*3/uL (ref 4.0–10.5)

## 2024-07-27 LAB — LIPID PANEL
Cholesterol: 132 mg/dL (ref 28–200)
HDL: 36.4 mg/dL — ABNORMAL LOW
LDL Cholesterol: 62 mg/dL (ref 10–99)
NonHDL: 95.72
Total CHOL/HDL Ratio: 4
Triglycerides: 167 mg/dL — ABNORMAL HIGH (ref 10.0–149.0)
VLDL: 33.4 mg/dL (ref 0.0–40.0)

## 2024-07-27 LAB — BASIC METABOLIC PANEL WITH GFR
BUN: 19 mg/dL (ref 6–23)
CO2: 29 meq/L (ref 19–32)
Calcium: 9.2 mg/dL (ref 8.4–10.5)
Chloride: 105 meq/L (ref 96–112)
Creatinine, Ser: 1.12 mg/dL (ref 0.40–1.20)
GFR: 44.19 mL/min — ABNORMAL LOW
Glucose, Bld: 99 mg/dL (ref 70–99)
Potassium: 4.4 meq/L (ref 3.5–5.1)
Sodium: 141 meq/L (ref 135–145)

## 2024-07-27 LAB — VITAMIN D 25 HYDROXY (VIT D DEFICIENCY, FRACTURES): VITD: 30.06 ng/mL (ref 30.00–100.00)

## 2024-07-27 LAB — HEPATIC FUNCTION PANEL
ALT: 20 U/L (ref 3–35)
AST: 17 U/L (ref 5–37)
Albumin: 3.9 g/dL (ref 3.5–5.2)
Alkaline Phosphatase: 101 U/L (ref 39–117)
Bilirubin, Direct: 0.1 mg/dL (ref 0.1–0.3)
Total Bilirubin: 0.6 mg/dL (ref 0.2–1.2)
Total Protein: 6.9 g/dL (ref 6.0–8.3)

## 2024-07-27 LAB — TSH: TSH: 2.28 u[IU]/mL (ref 0.35–5.50)

## 2024-07-27 MED ORDER — ATORVASTATIN CALCIUM 10 MG PO TABS: 10.0000 mg | ORAL_TABLET | Freq: Every day | ORAL | 3 refills | Status: AC

## 2024-07-27 MED ORDER — POTASSIUM CHLORIDE ER 10 MEQ PO TBCR: 10.0000 meq | EXTENDED_RELEASE_TABLET | Freq: Every day | ORAL | 1 refills | Status: AC

## 2024-07-27 NOTE — Telephone Encounter (Signed)
 Patient is asking if 200 mg Magnesium  Glycinate capsules are okay? Directions state to take 2

## 2024-07-27 NOTE — Patient Instructions (Addendum)
 Follow up in 6 months to recheck blood pressure and cholesterol We'll notify you of your lab results and make any changes if needed You can start OTC Magnesium  Glycinate for headaches and sleep Continue the Dairy Free daily You can increase Imodium to daily Call with any questions or concerns Stay Safe!  Stay Healthy!

## 2024-07-27 NOTE — Progress Notes (Signed)
" ° °  Subjective:    Patient ID: Jacqueline [name], female    DOB: Dec 23, 1934, 89 y.o.   MRN: [id number]  HPI CPE- no longer a candidate for mammo, colonoscopy.  UTD on Tdap, PNA, flu.  Health Maintenance  Topic Date Due   Medicare Annual Wellness (AWV)  01/08/2022   Colonoscopy  07/27/2025 (Originally 05/06/2023)   COVID-19 Vaccine (9 - Pfizer risk 2025-26 season) 10/28/2024   DTaP/Tdap/Td (4 - Td or Tdap) 07/24/2033   Pneumococcal Vaccine: 50+ Years  Completed   Influenza Vaccine  Completed   Bone Density Scan  Completed   Zoster Vaccines- Shingrix  Completed   Meningococcal B Vaccine  Aged Out   Mammogram  Discontinued    Patient Care Team    Relationship Specialty Notifications Start End  Mahlon Comer BRAVO, MD PCP - General Family Medicine  11/28/15   Leslee Reusing, MD Consulting Physician Ophthalmology  05/17/16   Colon Shove, MD Consulting Physician Neurosurgery  02/26/17   Jane Charleston, MD Consulting Physician Orthopedic Surgery  05/28/17   Robinson Pao, MD Consulting Physician Dermatology  05/28/17   Curvin Deward MOULD, MD Consulting Physician General Surgery  12/10/17   Odean Potts, MD Consulting Physician Hematology and Oncology  12/10/17   Crawford Morna Pickle, NP Nurse Practitioner Hematology and Oncology  12/10/17       Review of Systems Patient reports no vision/hearing changes, adenopathy, fever, weight change,  persistant/recurrent hoarseness , swallowing issues, chest pain, palpitations, edema, persistant/recurrent cough, hemoptysis, dyspnea (rest/exertional/paroxysmal nocturnal), gastrointestinal bleeding (melena, rectal bleeding), abdominal pain, significant heartburn, bowel changes, GU symptoms (dysuria, hematuria, incontinence), Gyn symptoms (abnormal  bleeding, pain),  syncope, focal weakness, memory loss, skin/hair/nail changes, abnormal bruising or bleeding, anxiety, or depression.   + neuropathy in feet    Objective:   Physical Exam General  Appearance:    Alert, cooperative, no distress, appears stated age  Head:    Normocephalic, without obvious abnormality, atraumatic  Eyes:    PERRL, conjunctiva/corneas clear, EOM's intact both eyes  Ears:    Normal TM's and external ear canals, both ears  Nose:   Nares normal, septum midline, mucosa normal, no drainage    or sinus tenderness  Throat:   Lips, mucosa, and tongue normal; teeth and gums normal  Neck:   Supple, symmetrical, trachea midline, no adenopathy;    Thyroid : no enlargement/tenderness/nodules  Back:     Symmetric, no curvature, ROM normal, no CVA tenderness  Lungs:     Clear to auscultation bilaterally, respirations unlabored  Chest Wall:    No tenderness or deformity   Heart:    Regular rate and rhythm, S1 and S2 normal, no murmur, rub   or gallop  Breast Exam:    Deferred to GYN  Abdomen:     Soft, non-tender, bowel sounds active all four quadrants,    no masses, no organomegaly  Genitalia:    Deferred to GYN  Rectal:    Extremities:   Extremities normal, atraumatic, no cyanosis or edema  Pulses:   2+ and symmetric all extremities  Skin:   Skin color, texture, turgor normal, no rashes or lesions  Lymph nodes:   Cervical, supraclavicular, and axillary nodes normal  Neurologic:   CNII-XII intact, normal strength, sensation and reflexes    throughout          Assessment & Plan:    "

## 2024-07-31 NOTE — Assessment & Plan Note (Signed)
 Pt's PE unchanged from previous.  Doing remarkably well for 89.  UTD on Tdap, PNA, flu.  Check labs.  Anticipatory guidance provided.

## 2025-01-24 ENCOUNTER — Ambulatory Visit: Admitting: Family Medicine
